# Patient Record
Sex: Female | Born: 1940 | Race: White | Hispanic: No | Marital: Married | Smoking: Former smoker
Health system: Southern US, Community
[De-identification: ages and names within clinical notes are randomized; demographics above are authoritative.]

## PROBLEM LIST (undated history)

## (undated) DIAGNOSIS — K219 Gastro-esophageal reflux disease without esophagitis: Secondary | ICD-10-CM

## (undated) DIAGNOSIS — E785 Hyperlipidemia, unspecified: Secondary | ICD-10-CM

## (undated) DIAGNOSIS — E118 Type 2 diabetes mellitus with unspecified complications: Secondary | ICD-10-CM

## (undated) DIAGNOSIS — F329 Major depressive disorder, single episode, unspecified: Secondary | ICD-10-CM

## (undated) DIAGNOSIS — D649 Anemia, unspecified: Secondary | ICD-10-CM

## (undated) DIAGNOSIS — I6529 Occlusion and stenosis of unspecified carotid artery: Secondary | ICD-10-CM

## (undated) DIAGNOSIS — K5792 Diverticulitis of intestine, part unspecified, without perforation or abscess without bleeding: Secondary | ICD-10-CM

## (undated) DIAGNOSIS — F32A Depression, unspecified: Secondary | ICD-10-CM

## (undated) DIAGNOSIS — I2510 Atherosclerotic heart disease of native coronary artery without angina pectoris: Secondary | ICD-10-CM

## (undated) DIAGNOSIS — N183 Chronic kidney disease, stage 3 unspecified: Secondary | ICD-10-CM

## (undated) DIAGNOSIS — M1990 Unspecified osteoarthritis, unspecified site: Secondary | ICD-10-CM

## (undated) DIAGNOSIS — S8261XA Displaced fracture of lateral malleolus of right fibula, initial encounter for closed fracture: Secondary | ICD-10-CM

## (undated) DIAGNOSIS — R32 Unspecified urinary incontinence: Secondary | ICD-10-CM

## (undated) DIAGNOSIS — I10 Essential (primary) hypertension: Secondary | ICD-10-CM

## (undated) DIAGNOSIS — I5189 Other ill-defined heart diseases: Secondary | ICD-10-CM

## (undated) DIAGNOSIS — I639 Cerebral infarction, unspecified: Secondary | ICD-10-CM

## (undated) HISTORY — DX: Other ill-defined heart diseases: I51.89

## (undated) HISTORY — DX: Occlusion and stenosis of unspecified carotid artery: I65.29

## (undated) HISTORY — DX: Diverticulitis of intestine, part unspecified, without perforation or abscess without bleeding: K57.92

## (undated) HISTORY — DX: Atherosclerotic heart disease of native coronary artery without angina pectoris: I25.10

## (undated) HISTORY — DX: Unspecified osteoarthritis, unspecified site: M19.90

## (undated) HISTORY — PX: TONSILLECTOMY: SUR1361

## (undated) HISTORY — DX: Type 2 diabetes mellitus with unspecified complications: E11.8

## (undated) HISTORY — DX: Chronic kidney disease, stage 3 unspecified: N18.30

## (undated) HISTORY — DX: Hyperlipidemia, unspecified: E78.5

## (undated) HISTORY — DX: Chronic kidney disease, stage 3 (moderate): N18.3

## (undated) HISTORY — DX: Unspecified urinary incontinence: R32

## (undated) HISTORY — DX: Anemia, unspecified: D64.9

## (undated) HISTORY — DX: Cerebral infarction, unspecified: I63.9

---
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
## 1971-01-02 HISTORY — PX: ABDOMINAL HYSTERECTOMY: SHX81

## 1988-01-02 HISTORY — PX: CHOLECYSTECTOMY: SHX55

## 2009-02-21 ENCOUNTER — Ambulatory Visit: Payer: Self-pay | Admitting: Family Medicine

## 2010-01-25 ENCOUNTER — Ambulatory Visit: Payer: Self-pay

## 2011-04-23 ENCOUNTER — Ambulatory Visit: Payer: Self-pay | Admitting: Family Medicine

## 2011-05-03 LAB — HM COLONOSCOPY

## 2011-05-07 ENCOUNTER — Ambulatory Visit: Payer: Self-pay | Admitting: Gastroenterology

## 2011-05-08 LAB — PATHOLOGY REPORT

## 2012-06-05 ENCOUNTER — Ambulatory Visit: Payer: Self-pay | Admitting: Family Medicine

## 2013-09-23 ENCOUNTER — Ambulatory Visit: Payer: Self-pay | Admitting: Family Medicine

## 2013-09-25 ENCOUNTER — Ambulatory Visit: Payer: Self-pay | Admitting: Family Medicine

## 2013-09-29 ENCOUNTER — Inpatient Hospital Stay: Payer: Self-pay | Admitting: Internal Medicine

## 2013-09-29 LAB — TROPONIN I: TROPONIN-I: 0.03 ng/mL

## 2013-09-29 LAB — URINALYSIS, COMPLETE
Bilirubin,UR: NEGATIVE
Glucose,UR: NEGATIVE mg/dL
Leukocyte Esterase: NEGATIVE
Nitrite: NEGATIVE
Ph: 5
Protein: 100
RBC,UR: 1 /HPF
Specific Gravity: 1.015
Squamous Epithelial: 1
WBC UR: 1 /HPF

## 2013-09-29 LAB — COMPREHENSIVE METABOLIC PANEL WITH GFR
Albumin: 3.7 g/dL
Alkaline Phosphatase: 79 U/L
Anion Gap: 7
BUN: 25 mg/dL — ABNORMAL HIGH
Bilirubin,Total: 0.4 mg/dL
Calcium, Total: 9 mg/dL
Chloride: 111 mmol/L — ABNORMAL HIGH
Co2: 24 mmol/L
Creatinine: 1.47 mg/dL — ABNORMAL HIGH
EGFR (African American): 45 — ABNORMAL LOW
EGFR (Non-African Amer.): 37 — ABNORMAL LOW
Glucose: 120 mg/dL — ABNORMAL HIGH
Osmolality: 289
Potassium: 4.3 mmol/L
SGOT(AST): 20 U/L
SGPT (ALT): 16 U/L
Sodium: 142 mmol/L
Total Protein: 6.8 g/dL

## 2013-09-29 LAB — CBC
HCT: 46.2 %
HGB: 14.7 g/dL
MCH: 29.9 pg
MCHC: 31.7 g/dL — ABNORMAL LOW
MCV: 94 fL
Platelet: 187 x10 3/mm 3
RBC: 4.91 X10 6/mm 3
RDW: 13.2 %
WBC: 10.2 x10 3/mm 3

## 2013-09-30 ENCOUNTER — Ambulatory Visit: Payer: Self-pay | Admitting: Neurology

## 2013-09-30 ENCOUNTER — Ambulatory Visit: Payer: Self-pay | Admitting: General Surgery

## 2013-09-30 DIAGNOSIS — I359 Nonrheumatic aortic valve disorder, unspecified: Secondary | ICD-10-CM

## 2013-09-30 LAB — BASIC METABOLIC PANEL
Anion Gap: 7 (ref 7–16)
BUN: 22 mg/dL — ABNORMAL HIGH (ref 7–18)
CALCIUM: 8.7 mg/dL (ref 8.5–10.1)
Chloride: 110 mmol/L — ABNORMAL HIGH (ref 98–107)
Co2: 25 mmol/L (ref 21–32)
Creatinine: 1.29 mg/dL (ref 0.60–1.30)
EGFR (African American): 52 — ABNORMAL LOW
EGFR (Non-African Amer.): 43 — ABNORMAL LOW
GLUCOSE: 108 mg/dL — AB (ref 65–99)
OSMOLALITY: 287 (ref 275–301)
Potassium: 3.8 mmol/L (ref 3.5–5.1)
Sodium: 142 mmol/L (ref 136–145)

## 2013-09-30 LAB — LIPID PANEL
CHOLESTEROL: 203 mg/dL — AB (ref 0–200)
HDL: 32 mg/dL — AB (ref 40–60)
LDL CHOLESTEROL, CALC: 133 mg/dL — AB (ref 0–100)
Triglycerides: 189 mg/dL (ref 0–200)
VLDL CHOLESTEROL, CALC: 38 mg/dL (ref 5–40)

## 2013-10-02 ENCOUNTER — Encounter: Payer: Self-pay | Admitting: *Deleted

## 2013-10-02 ENCOUNTER — Inpatient Hospital Stay (HOSPITAL_COMMUNITY)
Admission: RE | Admit: 2013-10-02 | Discharge: 2013-10-21 | DRG: 057 | Disposition: A | Discharge status: Home Health | Payer: Medicare Other | Source: Other Acute Inpatient Hospital | Attending: Physical Medicine & Rehabilitation | Admitting: Physical Medicine & Rehabilitation

## 2013-10-02 ENCOUNTER — Encounter (HOSPITAL_COMMUNITY): Payer: Self-pay | Admitting: Physical Medicine and Rehabilitation

## 2013-10-02 DIAGNOSIS — F17210 Nicotine dependence, cigarettes, uncomplicated: Secondary | ICD-10-CM | POA: Diagnosis present

## 2013-10-02 DIAGNOSIS — E785 Hyperlipidemia, unspecified: Secondary | ICD-10-CM | POA: Diagnosis present

## 2013-10-02 DIAGNOSIS — I6330 Cerebral infarction due to thrombosis of unspecified cerebral artery: Secondary | ICD-10-CM

## 2013-10-02 DIAGNOSIS — K5900 Constipation, unspecified: Secondary | ICD-10-CM | POA: Diagnosis not present

## 2013-10-02 DIAGNOSIS — K219 Gastro-esophageal reflux disease without esophagitis: Secondary | ICD-10-CM | POA: Diagnosis present

## 2013-10-02 DIAGNOSIS — I10 Essential (primary) hypertension: Secondary | ICD-10-CM | POA: Diagnosis present

## 2013-10-02 DIAGNOSIS — I69351 Hemiplegia and hemiparesis following cerebral infarction affecting right dominant side: Principal | ICD-10-CM

## 2013-10-02 DIAGNOSIS — F3341 Major depressive disorder, recurrent, in partial remission: Secondary | ICD-10-CM | POA: Diagnosis present

## 2013-10-02 DIAGNOSIS — M109 Gout, unspecified: Secondary | ICD-10-CM | POA: Diagnosis not present

## 2013-10-02 DIAGNOSIS — R4587 Impulsiveness: Secondary | ICD-10-CM | POA: Diagnosis present

## 2013-10-02 DIAGNOSIS — G8110 Spastic hemiplegia affecting unspecified side: Secondary | ICD-10-CM | POA: Diagnosis present

## 2013-10-02 DIAGNOSIS — F329 Major depressive disorder, single episode, unspecified: Secondary | ICD-10-CM | POA: Diagnosis present

## 2013-10-02 DIAGNOSIS — E119 Type 2 diabetes mellitus without complications: Secondary | ICD-10-CM | POA: Diagnosis present

## 2013-10-02 DIAGNOSIS — G8191 Hemiplegia, unspecified affecting right dominant side: Secondary | ICD-10-CM

## 2013-10-02 DIAGNOSIS — R062 Wheezing: Secondary | ICD-10-CM | POA: Diagnosis not present

## 2013-10-02 DIAGNOSIS — I639 Cerebral infarction, unspecified: Secondary | ICD-10-CM | POA: Diagnosis present

## 2013-10-02 DIAGNOSIS — N289 Disorder of kidney and ureter, unspecified: Secondary | ICD-10-CM

## 2013-10-02 HISTORY — DX: Essential (primary) hypertension: I10

## 2013-10-02 HISTORY — DX: Major depressive disorder, single episode, unspecified: F32.9

## 2013-10-02 HISTORY — DX: Depression, unspecified: F32.A

## 2013-10-02 MED ORDER — NICOTINE 14 MG/24HR TD PT24
14.0000 mg | MEDICATED_PATCH | Freq: Every day | TRANSDERMAL | Status: DC
  Administered 2013-10-03 – 2013-10-21 (×19): 14 mg via TRANSDERMAL
  Filled 2013-10-02 (×22): qty 1

## 2013-10-02 MED ORDER — FLUTICASONE PROPIONATE 50 MCG/ACT NA SUSP
1.0000 | Freq: Every day | NASAL | Status: DC
  Administered 2013-10-02 – 2013-10-21 (×19): 1 via NASAL
  Filled 2013-10-02: qty 16

## 2013-10-02 MED ORDER — ALBUTEROL SULFATE HFA 108 (90 BASE) MCG/ACT IN AERS: 1.0000 | INHALATION_SPRAY | RESPIRATORY_TRACT | Status: DC | PRN

## 2013-10-02 MED ORDER — SIMVASTATIN 20 MG PO TABS
20.0000 mg | ORAL_TABLET | Freq: Every day | ORAL | Status: DC
  Administered 2013-10-02 – 2013-10-20 (×19): 20 mg via ORAL
  Filled 2013-10-02 (×20): qty 1

## 2013-10-02 MED ORDER — TRAMADOL HCL 50 MG PO TABS
50.0000 mg | ORAL_TABLET | Freq: Four times a day (QID) | ORAL | Status: DC | PRN
  Administered 2013-10-08 – 2013-10-10 (×3): 50 mg via ORAL
  Filled 2013-10-02 (×5): qty 1

## 2013-10-02 MED ORDER — PROCHLORPERAZINE MALEATE 5 MG PO TABS
5.0000 mg | ORAL_TABLET | Freq: Four times a day (QID) | ORAL | Status: DC | PRN
  Filled 2013-10-02: qty 2

## 2013-10-02 MED ORDER — LISINOPRIL 5 MG PO TABS
5.0000 mg | ORAL_TABLET | Freq: Two times a day (BID) | ORAL | Status: DC
  Administered 2013-10-02 – 2013-10-10 (×16): 5 mg via ORAL
  Filled 2013-10-02 (×18): qty 1

## 2013-10-02 MED ORDER — DIPHENHYDRAMINE HCL 12.5 MG/5ML PO ELIX: 12.5000 mg | ORAL_SOLUTION | Freq: Four times a day (QID) | ORAL | Status: DC | PRN

## 2013-10-02 MED ORDER — ENOXAPARIN SODIUM 40 MG/0.4ML ~~LOC~~ SOLN
40.0000 mg | SUBCUTANEOUS | Status: DC
  Administered 2013-10-02 – 2013-10-04 (×3): 40 mg via SUBCUTANEOUS
  Filled 2013-10-02 (×4): qty 0.4

## 2013-10-02 MED ORDER — ASPIRIN EC 325 MG PO TBEC
325.0000 mg | DELAYED_RELEASE_TABLET | Freq: Every day | ORAL | Status: DC
  Administered 2013-10-03 – 2013-10-21 (×19): 325 mg via ORAL
  Filled 2013-10-02 (×20): qty 1

## 2013-10-02 MED ORDER — GUAIFENESIN-DM 100-10 MG/5ML PO SYRP
5.0000 mL | ORAL_SOLUTION | Freq: Four times a day (QID) | ORAL | Status: DC | PRN
  Administered 2013-10-11 – 2013-10-20 (×2): 10 mL via ORAL
  Filled 2013-10-02 (×2): qty 10

## 2013-10-02 MED ORDER — PROCHLORPERAZINE 25 MG RE SUPP
12.5000 mg | Freq: Four times a day (QID) | RECTAL | Status: DC | PRN
  Filled 2013-10-02: qty 1

## 2013-10-02 MED ORDER — PROCHLORPERAZINE EDISYLATE 5 MG/ML IJ SOLN
5.0000 mg | Freq: Four times a day (QID) | INTRAMUSCULAR | Status: DC | PRN
  Filled 2013-10-02: qty 2

## 2013-10-02 MED ORDER — ASPIRIN EC 81 MG PO TBEC: 81.0000 mg | DELAYED_RELEASE_TABLET | Freq: Every day | ORAL | Status: DC

## 2013-10-02 MED ORDER — TROLAMINE SALICYLATE 10 % EX CREA
TOPICAL_CREAM | Freq: Two times a day (BID) | CUTANEOUS | Status: DC
  Filled 2013-10-02: qty 85

## 2013-10-02 MED ORDER — ESCITALOPRAM OXALATE 10 MG PO TABS
10.0000 mg | ORAL_TABLET | Freq: Every day | ORAL | Status: DC
  Administered 2013-10-02 – 2013-10-20 (×19): 10 mg via ORAL
  Filled 2013-10-02 (×20): qty 1

## 2013-10-02 MED ORDER — ALBUTEROL SULFATE (2.5 MG/3ML) 0.083% IN NEBU: 2.5000 mg | INHALATION_SOLUTION | RESPIRATORY_TRACT | Status: DC | PRN

## 2013-10-02 MED ORDER — MUSCLE RUB 10-15 % EX CREA
TOPICAL_CREAM | Freq: Two times a day (BID) | CUTANEOUS | Status: DC
  Administered 2013-10-02 – 2013-10-18 (×11): via TOPICAL
  Filled 2013-10-02: qty 85

## 2013-10-02 MED ORDER — ALUM & MAG HYDROXIDE-SIMETH 200-200-20 MG/5ML PO SUSP
30.0000 mL | ORAL | Status: DC | PRN
  Administered 2013-10-06: 30 mL via ORAL
  Filled 2013-10-02 (×2): qty 30

## 2013-10-02 MED ORDER — TRAZODONE HCL 50 MG PO TABS: 25.0000 mg | ORAL_TABLET | Freq: Every evening | ORAL | Status: DC | PRN

## 2013-10-02 MED ORDER — ACETAMINOPHEN 325 MG PO TABS: 325.0000 mg | ORAL_TABLET | ORAL | Status: DC | PRN

## 2013-10-02 NOTE — Progress Notes (Signed)
Patient arrived via [facility]. Patient oriented to room, rehab process, fall prevention plan, rehab safety plan and call light system with verbal understanding. Patient resting comfortably in bed with call bell at side. No c/o pain, will continue to monitor.

## 2013-10-02 NOTE — PMR Pre-admission (Signed)
[facility] PMR Admission Coordinator Pre-Admission Assessment  Patient: Candace Monroe is an 73 y.o., female MRN: [id number] DOB: 1940-12-15 Height: 5' (152.4 cm) Weight: 57.607 kg (127 lb)  Insurance Information HMO: yes     PPO:      PCP:      IPA:      80/20:      OTHER:  PRIMARY: Medicare UHC      Policy#: [id number]      Subscriber: self CM Name: Willa Frater      Phone#: [phone]     Fax#: [phone] Approval given from [place] with [facility] Medicare on 10-2 for seven days, 10-02-13 through 10-09-13 with updates due to Dorthula Nettles at above fax on 11-06-70 Pre-Cert#: [id number]      Employer: retired Benefits:  Phone #: [phone]     Name: Rolla Flatten. Date: 08-01-13      Deduct: none      Out of Pocket Max: [phone] (met $80.03)      Life Max: unlimited CIR: $430/day for days 1-4, pre-auth needed      SNF: $0/day for days 1-20; $155/day for days 21-64; $0/day for days 65-100 (max 100 days, pre-auth needed) Outpatient: 100%     Co-Pay: $40/visist, visit limit per medical necessity Home Health: 100%      Co-Pay: auth required at day 60 DME: 80%     Co-Pay: 20%  Providers: in network only  Emergency Facilities manager Information   Name Relation Home Work Sanders T Spouse [phone]  [phone]   Nicky, [place]   [phone]      Current Medical History  Patient Admitting Diagnosis: Left frontal CVA History of Present Illness:  This 73 year old female with a history of HTN and tobacco dependence present to [facility] emergency room with right sided weakness on 09-29-13. She was sitting on her porch that day when she developed a sudden onset of right sided pain and inability to move her right leg. Pt had to crawl inside to call her daughter and then EMS was called. CT head was negative and MRI findings revealed acute lacunar infarct in left frontal lobe. Further stroke work up completed and inpatient rehab was recommended by  therapy.  Patient's medical record from [facility] has been reviewed by the rehabilitation admission coordinator and physician.  Past Medical History  No past medical history on file.  Family History  family history is not on file.  Prior Rehab/Hospitalizations: none   Current Medications See MAR  Patients Current Diet:  Heart healthy  Precautions / Restrictions Precautions Precautions: Fall   Prior Activity Level Limited Community (1-2x/wk): Pt got out 2-3 x/week but does not drive. She would mainly stay in the car and husband did the errands. Pt enjoys her family and sitting on her porch.  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)   Upper Body Dressing  Independent  Min assist   Lower Body Dressing  Independent  Max assist   Grooming  Independent  Min assist   Eating/Drinking  Independent  Min assist   Toilet Transfer  Independent  Other (not assessed, anticipate max assist  x 2)   Bladder Continence   WFL  using bedpan   Bowel Management  WFL  no BM documented in [facility] system   Stair Climbing   Independent  Other (not assessed, anticipate needs)   Communication  [facility]  WFL, a bit "fiesty"    Memory  WFL  not assessed    Cooking/Meal Prep  Independent      Housework  Independent    Money Management  Independent    Driving   pt did not drive. Husband did the driving.     Special needs/care consideration BiPAP/CPAP no  CPM no  Continuous Drip IV no  Dialysis no          Life Vest no  Oxygen no  Special Bed no  Trach Size no  Wound Vac (area) no      Skin - no issues                                Bowel mgmt: no BM documented in [facility] system Bladder mgmt: using bedpan  Diabetic mgmt - pt was diabetic previously and per husband/son, "She was taken off her DM meds about a year ago and is doing fine."  Previous Home Environment Living Arrangements: Spouse/significant other  Lives With: Spouse Available Help at Discharge: Family Type of Home: Mobile home Home Layout: One level Home Access: Stairs to enter Entrance Stairs-Rails: Right;Left (rails can be adjusted or a ramp can be built per son) Technical brewer of Steps: 4  Discharge Living Setting Plans for Discharge Living Setting: Patient's home;Mobile Home Type of Home at Discharge: Mobile home Discharge Home Layout: One level Discharge Home Access: Stairs to enter Entrance Stairs-Rails: Right;Left (rails can be adjusted or ramp can be built per son) Technical brewer of Steps: 4 Does the patient have any problems obtaining your medications?: No  Social/Family/Support Systems Patient Roles: Spouse Contact Information: husband Joneen Caraway is primary contact Anticipated Caregiver: husband (son and dtr can also help as needed) (son does work but dtr can help as needed) Anticipated Ambulance person Information: see above Ability/Limitations of Caregiver: no limitations Caregiver Availability: 24/7 Discharge Plan Discussed with Primary Caregiver: Yes (discussed with pt and son on 10-01-13) Is Caregiver In Agreement with Plan?: Yes Does Caregiver/Family have Issues with Lodging/Transportation while Pt is in Rehab?: No (they may want to spend the night with pt)  Goals/Additional Needs Patient/Family Goal for Rehab: Supervision and Min assist wtih PT/OT; Supervision and Mod Ind with SLP Expected length of stay: 10-14 days Cultural Considerations: none Dietary Needs: heart healthy Equipment Needs: to be determined Pt/Family Agrees to Admission and willing to participate: Yes Program Orientation Provided & Reviewed with  Pt/Caregiver Including Roles  & Responsibilities: Yes  Patient Condition: I met with pt and her husband/son at [facility]) to explain the possibility of inpatient rehab. This 73 year old female was previously independent prior to this new L frontal CVA. She is currently requiring maximal assistance for bed mobility and maximal assistance of 2 for limited stand pivot transfers. She is displaying R LE weakness and is unable to take steps at this time. Her self care skills have also been impacted by the CVA. She is needing maximal assistance with bathing and dressing tasks and has decreased insight into her functional limitations. Patient will benefit from further skilled speech therapy to address higher level cognitive needs. She will benefit greatly from the multi-disciplinary  team approach of skilled PT, OT, SLP and rehab nursing to maximize her function following this new CVA. PT, OT and rehab nursing will focus on increasing strength for greater independence with bed mobility, transfers, gait and self care tasks. Rehab nursing will also address pt/family education needs with medications and bowel/bladder care.  In addition, pt will benefit from rehab physician intervention to monitor medical issues following her CVA. Discussed case with Dr. Naaman Plummer who feels that pt is a good inpatient rehab candidate and received clearance from Dr. Benjie Karvonen at [facility]. Pt and her family are motivated to come to inpatient rehab and will benefit from the intensive services of skilled therapy under rehab physician guidance. Pt will be admitted today on 10-02-13.  Preadmission Screen Completed By:  Nanetta Batty, PT, 10/02/2013 9:48 AM ______________________________________________________________________   Discussed status with Dr. Naaman Plummer on 10-02-13 at 1346 and received telephone approval for admission today.  Admission Coordinator:  Nanetta Batty, PT, time 1346/Date 10-02-13    Assessment/Plan: Diagnosis: left frontal cva 1. Does the need for close, 24 hr/day  Medical supervision in concert with the patient's rehab needs make it unreasonable for this patient to be served in a less intensive setting? Yes 2. Co-Morbidities requiring supervision/potential complications: htn, post-cva sequelae 3. Due to bladder management, bowel management, safety, skin/wound care, disease management, medication administration, pain management and patient education, does the patient require 24 hr/day rehab nursing? Yes 4. Does the patient require coordinated care of a physician, rehab nurse, PT (1-2 hrs/day, 5 days/week), OT (1-2 hrs/day, 5 days/week) and SLP (1-2 hrs/day, 5 days/week) to address physical and functional deficits in the context of the above medical diagnosis(es)? Yes Addressing deficits in the following areas: balance, endurance, locomotion, strength, transferring, bowel/bladder control, bathing, dressing, feeding, grooming, toileting, cognition, speech, language, swallowing and psychosocial support 5. Can the patient actively participate in an intensive therapy program of at least 3 hrs of therapy 5 days a week? Yes 6. The potential for patient to make measurable gains while on inpatient rehab is excellent 7. Anticipated functional outcomes upon discharge from inpatients are: supervision and min assist PT, supervision and min assist OT, supervision SLP 8. Estimated rehab length of stay to reach the above functional goals is: 18-24 days 9. Does the patient have adequate social supports to accommodate these discharge functional goals? Yes 10. Anticipated D/C setting: Home 11. Anticipated post D/C treatments: HH therapy and Outpatient therapy 12. Overall Rehab/Functional Prognosis: excellent    RECOMMENDATIONS: This patient's condition is appropriate for continued rehabilitative care in the following setting: CIR Patient has agreed to participate in recommended program.  Yes Note that insurance prior authorization may be required for reimbursement for recommended care.  Comment: Admit to inpatient rehab today.   Armistead Sult, PT 10/02/2013

## 2013-10-03 ENCOUNTER — Inpatient Hospital Stay (HOSPITAL_COMMUNITY): Payer: Medicare Other | Admitting: Occupational Therapy

## 2013-10-03 ENCOUNTER — Inpatient Hospital Stay (HOSPITAL_COMMUNITY): Payer: Medicare Other | Admitting: Speech Pathology

## 2013-10-03 ENCOUNTER — Inpatient Hospital Stay (HOSPITAL_COMMUNITY): Payer: Medicare Other | Admitting: Physical Therapy

## 2013-10-03 DIAGNOSIS — I6330 Cerebral infarction due to thrombosis of unspecified cerebral artery: Secondary | ICD-10-CM

## 2013-10-03 DIAGNOSIS — G8190 Hemiplegia, unspecified affecting unspecified side: Secondary | ICD-10-CM

## 2013-10-03 DIAGNOSIS — I10 Essential (primary) hypertension: Secondary | ICD-10-CM

## 2013-10-03 MED ORDER — SORBITOL 70 % SOLN
60.0000 mL | Status: AC
  Administered 2013-10-03: 60 mL via ORAL
  Filled 2013-10-03: qty 60

## 2013-10-03 MED ORDER — BISACODYL 10 MG RE SUPP
10.0000 mg | Freq: Every day | RECTAL | Status: DC | PRN
  Administered 2013-10-09: 10 mg via RECTAL
  Filled 2013-10-03: qty 1

## 2013-10-03 NOTE — Progress Notes (Signed)
Occupational Therapy Assessment and Plan  Patient Details  Name: Candace Monroe MRN: [id number] Date of Birth: 1940/01/12  OT Diagnosis: abnormal posture, hemiplegia affecting dominant side and muscle weakness (generalized) Rehab Potential: Rehab Potential: Good (for stated goals) ELOS: 17-19 days   Today's Date: 10/03/2013 OT Individual Time: 1100-1200 OT Individual Time Calculation (min): 60 min     Problem List:  Patient Active Problem List   Diagnosis Date Noted  . CVA (cerebral infarction) 10/02/2013    Past Medical History:  Past Medical History  Diagnosis Date  . HTN (hypertension)   . Depression   . Breast mass    Past Surgical History:  Past Surgical History  Procedure Laterality Date  . Tonsillectomy    . Abdominal hysterectomy    . Cholecystectomy      Assessment & Plan Clinical Impression: Patient is a 73 y.o. year old female with history of HTN, diabetes (off medications), right breast mass, ongoing tobacco use who was admitted to [place] on 09/27/13 with right sided weakness. Blood pressure 240/100 at admission. CT head without acute changes. MRI brain done revealing acute lacunar infarct left frontal lobe peri-Rolandic white matter, moderate to severe underlying chronic small vessel disease. 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 Cardiac echo with EF 50-60% with no wall abnormality, trace MVR. She was evaluated by Dr. Irish Elders who recommended full strength ASA for thrombotic stroke due to uncontrolled HTN, tobacco use and diabetes. 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 Patient transferred to CIR on 10/02/2013 .    Patient currently requires max-total A with basic self-care skills secondary to muscle weakness, decreased cardiorespiratoy endurance, decreased attention, decreased awareness,  decreased problem solving, decreased safety awareness and decreased memory and decreased sitting balance, decreased standing balance, decreased postural control, hemiplegia and decreased balance strategies.  Prior to hospitalization, patient could complete ADLs with independent .  Patient will benefit from skilled intervention to decrease level of assist with basic self-care skills prior to discharge home with care partner.  Anticipate patient will require 24 hour supervision and minimal physical assistance and follow up home health.  OT - End of Session Activity Tolerance: Decreased this session Endurance Deficit: Yes OT Assessment Rehab Potential: Good (for stated goals) Barriers to Discharge:  (none at this time) OT Patient demonstrates impairments in the following area(s): Balance;Cognition;Endurance;Motor;Safety OT Basic ADL's Functional Problem(s): Grooming;Bathing;Dressing;Toileting OT Advanced ADL's Functional Problem(s): Laundry OT Transfers Functional Problem(s): Toilet;Tub/Shower OT Plan OT Intensity: Minimum of 1-2 x/day, 45 to 90 minutes OT Frequency: 5 out of 7 days OT Duration/Estimated Length of Stay: 17-19 days OT Treatment/Interventions: Balance/vestibular training;Neuromuscular re-education;Patient/family education;Self Care/advanced ADL retraining;Therapeutic Exercise;UE/LE Coordination activities;Visual/perceptual remediation/compensation;UE/LE Strength taining/ROM;Therapeutic Activities;Psychosocial support;Functional mobility training;DME/adaptive equipment instruction;Discharge planning;Cognitive remediation/compensation OT Self Feeding Anticipated Outcome(s): n/a OT Basic Self-Care Anticipated Outcome(s): min A OT Toileting Anticipated Outcome(s): min A OT Bathroom Transfers Anticipated Outcome(s): min A OT Recommendation Recommendations for Other Services: Other (comment) (RT) Patient destination: Home Follow Up Recommendations: Home health OT Equipment  Recommended: 3 in 1 bedside comode;Tub/shower bench Equipment Details: has personal walker   Skilled Therapeutic Intervention Upon entering the room, pt supine in bed with husband, son , and daughter in law present during session. Pt and family educated on OT purpose, POC, and goals with pt and family in full agreement. Pt reported taking bathing and dressing with NT prior to OT arrival. OT session  with focus on STS, functional transfers, pt and family education, ADL training, and safety awareness. EOB balance with Min - Max A as pt with strong R lateral lean as well as posterior lean with activities.  Pt with decreased FMC in R hand as she was unable to zip jacket or write name legibly. Pt wearing gown and unable to problem solve how to remove without verbal cues from therapist. Pt requires max A stand pivot transfers to wheelchair from bed as well as from w/c to toilet. Pt reports her only problem is , "my foot."  OT educated and discussed deficits with pt for her to understanding progression towards goals in inpatient rehab. Education to continue. Pt seated in wheelchair with call bell and quick release belt donned. Family still present in room upon therapist exiting.   OT Evaluation Precautions/Restrictions  Precautions Precautions: Fall Precaution Comments: Pushes to R side. Restrictions Weight Bearing Restrictions: No General Chart Reviewed: Yes Vital Signs Therapy Vitals Temp: 98.3 F (36.8 C) Temp Source: Oral Pulse Rate: 79 Resp: 18 BP: 143/78 mmHg Patient Position (if appropriate): Lying Oxygen Therapy SpO2: 93 % O2 Device: None (Room air) Pain Pain Assessment Pain Assessment: No/denies pain Pain Score: 0-No pain Home Living/Prior Functioning Home Living Available Help at Discharge: Family;Available 24 hours/day;Available PRN/intermittently Type of Home: Mobile home Home Access: Stairs to enter Entrance Stairs-Number of Steps: 4 Entrance Stairs-Rails: Right;Left Home  Layout: One level Additional Comments: Pt owns personal standard walker.  Lives With: Spouse IADL History Homemaking Responsibilities: Yes (Pt reports husband performs all  of these tasks except laundry) Prior Function Level of Independence: Independent with basic ADLs;Independent with homemaking with ambulation;Independent with gait;Other (comment) (Husband does most of cooking and helps pt with cleaning)  Able to Take Stairs?: Yes Driving: Yes Vocation: Retired Leisure: Hobbies-yes (Comment) Comments: Pt enjoys word search puzzles, being outdoors. Vision/Perception  Vision- History Baseline Vision/History: Wears glasses Wears Glasses: Reading only Patient Visual Report: No change from baseline  Cognition Overall Cognitive Status: Impaired/Different from baseline Arousal/Alertness: Awake/alert Orientation Level: Oriented X4 Attention: Selective;Sustained Sustained Attention: Appears intact Sustained Attention Impairment: Verbal basic;Functional basic Selective Attention: Impaired Selective Attention Impairment: Verbal basic;Functional basic Memory: Impaired Memory Impairment: Decreased recall of new information Awareness: Impaired Awareness Impairment: Intellectual impairment Problem Solving: Impaired Problem Solving Impairment: Functional complex Executive Function: Self Monitoring;Decision Making;Self Correcting Decision Making: Impaired Decision Making Impairment: Functional basic Self Monitoring: Impaired Self Monitoring Impairment: Functional complex Self Correcting: Impaired Self Correcting Impairment: Functional complex Behaviors: Poor frustration tolerance Safety/Judgment: Impaired Sensation Sensation Light Touch: Appears Intact Stereognosis: Not tested Hot/Cold: Appears Intact Proprioception: Appears Intact Coordination Gross Motor Movements are Fluid and Coordinated: No Fine Motor Movements are Fluid and Coordinated: No Coordination and Movement  Description: difficulty writing name and zipping jacket Heel Shin Test: LLE WNL. Able to get into testing position with RLE, but no excursion with  RLE. Motor  Motor Motor: Hemiplegia;Abnormal tone;Abnormal postural alignment and control Mobility  Bed Mobility Bed Mobility: Supine to Sit Supine to Sit: 2: Max assist Supine to Sit Details: Tactile cues for sequencing;Verbal cues for technique;Verbal cues for sequencing Transfers Transfers: Sit to Stand Sit to Stand: 2: Max assist Stand to Sit Details (indicate cue type and reason): Verbal cues for precautions/safety;Manual facilitation for weight shifting;Verbal cues for sequencing  Trunk/Postural Assessment  Cervical Assessment Cervical Assessment: Exceptions to WFL (forward head) Thoracic Assessment Thoracic Assessment: Exceptions to WFL (kyphotic) Lumbar Assessment Lumbar Assessment: Exceptions to WFL (posterior pelvic tilt) Postural Control Postural Control: Deficits   on evaluation Trunk Control: In seated, pt with posterior preference, tendency toward lateral trunk lean to R side. In standing with bilay UE support at sink, pt pushed trunk to R side. Postural Limitations: Pt presents with R lateral lean as well as posterior lean in seated position.  Balance Balance Balance Assessed: Yes (Max A for dynamic standing balance for LB clothing management.) Extremity/Trunk Assessment RUE Assessment RUE Assessment: Exceptions to [facility] (strength 3-/5 grossly. Grip strength decreased as well vs left hand. ) LUE Assessment LUE Assessment: Within Functional Limits  FIM:  FIM - Eating Eating Activity: 5: Set-up assist for open containers FIM - Grooming Grooming Steps: Wash, rinse, dry face;Wash, rinse, dry hands;Brush, comb hair;Oral care, brush teeth, clean dentures Grooming: 5: Set-up assist to obtain items FIM - Bathing Bathing: 0: Activity did not occur FIM - Upper Body Dressing/Undressing Upper body dressing/undressing: 0: Wears  gown/pajamas-no public clothing FIM - Lower Body Dressing/Undressing Lower body dressing/undressing: 1: Total-Patient completed less than 25% of tasks FIM - Toileting Toileting steps completed by patient: Performs perineal hygiene Toileting: 2: Max-Patient completed 1 of 3 steps FIM - Bed/Chair Transfer Bed/Chair Transfer: 1: Two helpers;2: Supine > Sit: Max A (lifting assist/Pt. 25-49%);2: Bed > Chair or W/C: Max A (lift and lower assist) FIM - Radio producer Devices: Grab bars Toilet Transfers: 2-To toilet/BSC: Max A (lift and lower assist);2-From toilet/BSC: Max A (lift and lower assist) FIM - Tub/Shower Transfers Tub/shower Transfers: 0-Activity did not occur or was simulated   Refer to Care Plan for Long Term Goals  Recommendations for other services: Other: RT and neuropsych  Discharge Criteria: Patient will be discharged from OT if patient refuses treatment 3 consecutive times without medical reason, if treatment goals not met, if there is a change in medical status, if patient makes no progress towards goals or if patient is discharged from hospital.  The above assessment, treatment plan, treatment alternatives and goals were discussed and mutually agreed upon: by patient and by family  Phineas Semen 10/03/2013, 5:55 PM

## 2013-10-03 NOTE — H&P (Signed)
Physical Medicine and Rehabilitation Admission H&P      CC: Right sided weakness   HPI: Ms. Candace Monroe is s 73 year old female with history of HTN, diabetes (off medications), right breast mass, ongoing tobacco use who was admitted to [facility] on 09/27/13 with right sided weakness. Blood pressure 240/100 at admission. CT head without acute changes. MRI brain done revealing acute lacunar infarct left frontal lobe peri-Rolandic white matter, moderate to severe underlying chronic small vessel disease. 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 Cardiac echo with EF 50-60% with no wall abnormality, trace MVR. She was evaluated by Dr. Irish Elders who recommended full strength ASA for thrombotic stroke due to uncontrolled HTN, tobacco use and diabetes. 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 Therapy initiated and CIR recommended by rehab team.      Review of Systems  HENT: Negative for hearing loss.   Eyes: Negative for blurred vision, double vision and photophobia.  Respiratory: Positive for cough (chronic) and wheezing (chronic).         Nasal congestion.   Cardiovascular: Negative for chest pain and palpitations.  Gastrointestinal: Positive for heartburn. Negative for nausea, vomiting and constipation.  Genitourinary: Positive for urgency (chronic).  Musculoskeletal: Positive for back pain (uses aleve) and myalgias.  Neurological: Positive for focal weakness. Negative for dizziness, tingling, tremors and headaches.  Psychiatric/Behavioral: Negative for depression. The patient does not have insomnia.      Past Medical History   Diagnosis  Date   .  HTN (hypertension)     .  Depression     .  Breast mass         Past Surgical History   Procedure  Laterality  Date   .  Tonsillectomy       .  Abdominal hysterectomy        .  Cholecystectomy           Family History   Problem  Relation  Age of Onset   .  Cancer  Sister     .  Diabetes  Brother     .  Stroke  Mother     .  Seizures  Sister        Social History: Married. Has 5 children. Independent without AD prior to admission. Used to work in textiles--retired about 30 years ago. Smokes 1 PPD. Does not use alcohol or illicit drugs.    Allergies:   Allergies   Allergen  Reactions   .  Augmentin [Amoxicillin-Pot Clavulanate]     .  Sulfa Antibiotics         No prescriptions prior to admission      Home: One level home with 4 STE    Functional History: Independent PTA.    Functional Status:   Mobility: Max assist with bed mobility Max assist +2 with transfers--unable to weight bear thorough RLE or un-weight LLE.      ADL: Min assist with upper body dressing. Max assist with lower body dressing.   Min assist with self feeds.    Cognition: Needs cues for sequencing.  Impulsive. Has poor awareness of deficits.     Physical Exam: Blood pressure 163/101, pulse 54, temperature 98.1 F (36.7 C), temperature source Oral, resp. rate 19, height 5' (1.524 m), weight 53.9 kg (118 lb 13.3 oz), SpO2 97.00%. Physical Exam  Nursing  note and vitals reviewed. Constitutional: She is oriented to person, place, and time. She appears well-developed and well-nourished.  Intermittent congested cough noted. Tends to joke a lot  HENT:  Oral mucosa pink and moist Head: Normocephalic and atraumatic.  Eyes: Conjunctivae are normal. Pupils are equal, round, and reactive to light.  Neck: Normal range of motion. Neck supple.  Cardiovascular: Normal rate and regular rhythm.   Respiratory: Effort normal. She has wheezes (audible upper airway). She has no rales. She exhibits no tenderness.  GI: Soft. Bowel sounds are normal. She exhibits no distension. There is no tenderness.  Musculoskeletal: She exhibits no edema and no tenderness.  Neurological:  She is alert and oriented to person, place, and time.  Speech clear. Able to follow two step commands without difficulty. Right hemiparesis--deltoid 3/5, biceps/triceps 4/5, intrinsics 4/5, HF 3+/4, KE 3-/5, PF 2+/5, DF trace.  Seems to sense pain/LT. Reasonable insight and awareness. Attends to right side. Language appears appropriate also Skin: Skin is warm and dry.  Psychiatric: She has a normal mood and affect. Her behavior is normal. Judgment and thought content normal.    Labs studies:  Chol: 203     HDL: 32      LDL:133     Trig: 189 Na: 142    K: 3.8     CL-110     CO2: 25    BUN:22    Cr: 1.29    Gluc: 108 WBC: 10.2     Hgb: 14.7    Hct: 46.2     Plt: 187     Medical Problem List and Plan: 1. 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  DVT Prophylaxis/Anticoagulation: Pharmaceutical: Lovenox 3. Pain Management: N/A 4. Depression/Mood: continue lexapro 5. Neuropsych: This patient is capable of making decisions on her own behalf. 6. Skin/Wound Care: Routine pressure relief measures 7. Fluids/Electrolytes/Nutrition: Monitor I/O. Offer supplements if intake poor. Follow up labs on admit   8. HTN: Blood pressures trending upward and medications resumed today. Permissive hypertension to allow perfusion. Will monitor every 8 hours and titrate medication to home dose.   9. Diabetes: No medications for past 1-2 years--reports she lost a lot of weight and last hgb A1c around 5. Will check hgb A1c. Fasting BS 108-120 range.   -dietary ed  10. Dyslipidemia: Continue lipitor.          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   Patient is admitted to receive collaborative, interdisciplinary care between the physiatrist, rehab nursing staff, and therapy team. Patient's level of medical complexity and substantial therapy needs in context of that  medical necessity cannot be provided at a lesser intensity of care such as a SNF. Patient has experienced substantial functional loss from his/her baseline which was documented above under the "Functional History" and "Functional Status" headings.  Judging by the patient's diagnosis, physical exam, and functional history, the patient has potential for functional progress which will result in measurable gains while on inpatient rehab.  These gains will be of substantial and practical use upon discharge  in facilitating mobility and self-care at the household level. Physiatrist will provide 24 hour management of medical needs as well as oversight of the therapy plan/treatment and provide guidance as appropriate regarding the interaction of the two. 24 hour rehab nursing will assist with bladder management, bowel management, safety, skin/wound care, disease management, medication administration and patient education  and help  integrate therapy concepts, techniques,education, etc. PT will assess and treat for/with: Lower extremity strength, range of motion, stamina, balance, functional mobility, safety, adaptive techniques and equipment, NMR, cognitive perceptual rx, family ed, ego support.   Goals are: supervision to min assist. OT will assess and treat for/with: ADL's, functional mobility, safety, upper extremity strength, adaptive techniques and equipment, NMR, cognitive perceptual awareness, family ed, community reintegration.   Goals are: supervision to min assist. Therapy may proceed with showering this patient. SLP will assess and treat for/with: cognition, communication.  Goals are: mod I to supervision. Case Management and Social Worker will assess and treat for psychological issues and discharge planning. Team conference will be held weekly to assess progress toward goals and to determine barriers to discharge. Patient will receive at least 3 hours of therapy per day at least 5 days per week. ELOS:  10-14 days        Prognosis:  excellent         Meredith Staggers, MD, [facility] 10/03/2013

## 2013-10-03 NOTE — Evaluation (Signed)
Speech Language Pathology Assessment and Plan  Patient Details  Name: Candace Monroe MRN: [id number] Date of Birth: 1940-08-10  SLP Diagnosis: Cognitive Impairments  Rehab Potential: Good ELOS: 17-19 days    Today's Date: 10/03/2013 SLP Individual Time: 8115-7262 SLP Individual Time Calculation (min): 60 min   Problem List:  Patient Active Problem List   Diagnosis Date Noted  . CVA (cerebral infarction) 10/02/2013   Past Medical History:  Past Medical History  Diagnosis Date  . HTN (hypertension)   . Depression   . Breast mass    Past Surgical History:  Past Surgical History  Procedure Laterality Date  . Tonsillectomy    . Abdominal hysterectomy    . Cholecystectomy      Assessment / Plan / Recommendation Clinical Impression Ms. Candace Monroe is a 73 year old female with history of HTN, diabetes (off medications), right breast mass, ongoing tobacco use who was admitted to [facility] on 09/27/13 with right sided weakness. Blood pressure 240/100 at admission. CT head without acute changes. MRI brain done revealing acute lacunar infarct left frontal lobe peri-Rolandic white matter, moderate to severe underlying chronic small vessel disease. 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 Therapy initiated and CIR recommended by rehab team; patient admitted 10/02/13.  Orders received and cognitive-linguistic evaluation completed.  Patient demonstrates moderate cognitive impairments characterized by impaired awareness of deficits, safety awareness, recall of new information and complex problem solving which impact the patient's overall safety with functional tasks. Patient would benefit from skilled SLP intervention to maximize her functional independence prior to discharge. Anticipate patient will require 24 hour supervision at home.   Skilled Therapeutic Interventions           Cognitive-linguistic evaluation completed with results and recommendations reviewed with patient and family.     SLP Assessment  Patient will need skilled [facility] during CIR admission    Recommendations  Oral Care Recommendations: Oral care BID Patient destination: Home Follow up Recommendations: 24 hour supervision/assistance;None Equipment Recommended: None recommended by SLP    SLP Frequency 5 out of 7 days   SLP Treatment/Interventions Cognitive remediation/compensation;Cueing hierarchy;Functional tasks;Medication managment;Internal/external aids;Patient/family education    Pain Pain Assessment Pain Assessment: No/denies pain Prior Functioning Cognitive/Linguistic Baseline: Within functional limits Type of Home: Mobile home Available Help at Discharge: Family;Available 24 hours/day;Available PRN/intermittently Vocation: Retired  Industrial/product designer Term Goals: Week 1: SLP Short Term Goal 1 (Week 1): Pt will label 2 physical and 1 cognitive change post CVA with Min question cues  SLP Short Term Goal 2 (Week 1): Pt will request help as needed with Mod question cues SLP Short Term Goal 3 (Week 1): Pt will demonstrate complex problem solving with Min assist to self-monitor and correct errors SLP Short Term Goal 4 (Week 1): Pt will utilize external aids to assist with recal of new information with Min cues  See FIM for current functional status Refer to Care Plan for Long Term Goals  Recommendations for other services: None  Discharge Criteria: Patient will be discharged from SLP if patient refuses treatment 3 consecutive times without medical reason, if treatment goals not met, if there is a change in medical status, if patient makes no progress towards goals or if patient is discharged from hospital.  The above assessment, treatment plan, treatment alternatives and goals were discussed and mutually agreed upon: by patient and by family  Gunnar Fusi, M.A.,  CCC-SLP [phone]  Akshay Spang 10/03/2013, 4:42 PM

## 2013-10-03 NOTE — Evaluation (Addendum)
Physical Therapy Assessment and Plan  Patient Details  Name: Candace Monroe MRN: [id number] Date of Birth: 1940/11/04  PT Diagnosis: Abnormal posture, Abnormality of gait, Hemiplegia dominant, Impaired cognition and Muscle weakness Rehab Potential: Good ELOS: 17-19 days   Today's Date: 10/03/2013 PT Individual Time: 0802-0904 PT Individual Time Calculation (min): 62 min    Problem List:  Patient Active Problem List   Diagnosis Date Noted  . CVA (cerebral infarction) 10/02/2013    Past Medical History:  Past Medical History  Diagnosis Date  . HTN (hypertension)   . Depression   . Breast mass    Past Surgical History:  Past Surgical History  Procedure Laterality Date  . Tonsillectomy    . Abdominal hysterectomy    . Cholecystectomy      Assessment & Plan Clinical Impression: Ms. Candace Monroe is s 73 year old female with history of HTN, diabetes (off medications), right breast mass, ongoing tobacco use who was admitted to [facility] on 09/27/13 with right sided weakness. Blood pressure 240/100 at admission. CT head without acute changes. MRI brain done revealing acute lacunar infarct left frontal lobe peri-Rolandic white matter, moderate to severe underlying chronic small vessel disease. 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 Cardiac echo with EF 50-60% with no wall abnormality, trace MVR. She was evaluated by Dr. Loretha Brasil who recommended full strength ASA for thrombotic stroke due to uncontrolled HTN, tobacco use and diabetes. 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 Patient transferred to CIR on 10/02/2013 .   Patient currently requires max-Total with mobility secondary to muscle weakness, decreased cardiorespiratoy endurance, impaired timing and sequencing, abnormal tone, unbalanced muscle activation and decreased  motor planning, decreased midline orientation and decreased attention to right, decreased initiation, decreased attention, decreased awareness, decreased problem solving and decreased memory and decreased sitting balance, decreased standing balance, decreased postural control, hemiplegia and decreased balance strategies.  Prior to hospitalization, patient was independent  with mobility and lived with Spouse in a Mobile home home.  Home access is 4  steps with 2 rails.  Patient will benefit from skilled PT intervention to maximize safe functional mobility and minimize fall risk for planned discharge home with 24 hour supervision.  Anticipate patient will benefit from follow up HH at discharge.  PT - End of Session Activity Tolerance: Tolerates 30+ min activity with multiple rests Endurance Deficit: Yes PT Assessment Rehab Potential: Good Barriers to Discharge: Other (comment) (None) PT Patient demonstrates impairments in the following area(s): Balance;Behavior;Endurance;Motor;Perception;Safety;Other (comment) (Cognition) PT Transfers Functional Problem(s): Bed Mobility;Bed to Chair;Car;Furniture PT Locomotion Functional Problem(s): Ambulation;Wheelchair Mobility;Stairs PT Plan PT Intensity: Minimum of 1-2 x/day ,45 to 90 minutes PT Frequency: 5 out of 7 days PT Duration Estimated Length of Stay: 17-19 days PT Treatment/Interventions: Ambulation/gait training;Balance/vestibular training;Cognitive remediation/compensation;Disease management/prevention;Discharge planning;Functional mobility training;Patient/family education;Splinting/orthotics;Therapeutic Exercise;Therapeutic Activities;Visual/perceptual remediation/compensation;UE/LE Coordination activities;UE/LE Strength taining/ROM;Wheelchair propulsion/positioning;Stair Forensic psychologist;Neuromuscular re-education PT Transfers Anticipated Outcome(s): Supervision PT Locomotion Anticipated Outcome(s): Min A to Mod A PT  Recommendation Recommendations for Other Services: Other (comment) (None at this time) Follow Up Recommendations: 24 hour supervision/assistance;Home health PT Patient destination: Home Equipment Recommended: To be determined  Skilled Therapeutic Intervention PT evaluation performed; see below for detailed findings. PT treatment initiated. Pt's clothing and bed soiled due to incontinent episode; pt aware but did not initiate notifying staff. Therefore, pt performed static standing x2 minutes with bilat UE support at  sink while PT provided assistance for stability whle NT assisted with hygiene and brief/clothing change. See below for further details of assist/cueing required with balance, transfers, gait, and w/c mobility. Stairs not assessed due to time constraint. Educated pt and daughter on evaluation, goals, findings, and plan of care. Pt/daughter verbalized understanding and were in full agreement with plan of care.  PT Evaluation Precautions/Restrictions Precautions Precautions: Fall Precaution Comments: Pushes to R side. Restrictions Weight Bearing Restrictions: No General   Vital SignsTherapy Vitals Temp: 98.3 F (36.8 C) Temp Source: Oral Pulse Rate: 79 Resp: 18 BP: 143/78 mmHg Patient Position (if appropriate): Lying Oxygen Therapy SpO2: 93 % O2 Device: None (Room air) Pain Pain Assessment Pain Assessment: No/denies pain Home Living/Prior Functioning Home Living Available Help at Discharge: Family;Available 24 hours/day;Available PRN/intermittently Type of Home: Mobile home Prior Function Vocation: Retired Vision/Perception    Pt wears glasses for reading only; reports no change in vision. Cognition Orientation Level: Oriented X4 Attention: Sustained Sustained Attention: Impaired Sustained Attention Impairment: Verbal basic;Functional basic Awareness: Impaired Awareness Impairment: Intellectual impairment Problem Solving: Impaired Problem Solving Impairment:  Functional basic Executive Function: Self Monitoring;Initiating Self Monitoring: Impaired Self Monitoring Impairment: Functional basic Sensation Sensation Light Touch: Appears Intact (bilat LE's) Stereognosis: Not tested Hot/Cold: Not tested Proprioception: Appears Intact (bilat LE's) Coordination Gross Motor Movements are Fluid and Coordinated: No Heel Shin Test: LLE WNL. Able to get into testing position with RLE, but no excursion with  RLE. Motor  Motor Motor: Hemiplegia;Abnormal tone;Abnormal postural alignment and control  Mobility Bed Mobility Bed Mobility: Supine to Sit Supine to Sit: 2: Max assist Supine to Sit Details: Tactile cues for sequencing;Verbal cues for technique;Verbal cues for sequencing Transfers Transfers: Yes Sit to Stand: 3: Mod assist;From chair/3-in-1 Sit to Stand Details: Manual facilitation for weight shifting;Tactile cues for weight beaing;Manual facilitation for placement Stand to Sit: 3: Mod assist Stand to Sit Details (indicate cue type and reason): Verbal cues for precautions/safety;Manual facilitation for weight shifting;Verbal cues for sequencing Stand Pivot Transfers: 2: Max assist Stand Pivot Transfer Details: Verbal cues for sequencing;Verbal cues for technique;Tactile cues for weight shifting;Other (comment) (to L side) Squat Pivot Transfers: 1: +2 Total assist Squat Pivot Transfer Details: Tactile cues for sequencing;Verbal cues for sequencing;Verbal cues for technique;Tactile cues for weight shifting;Verbal cues for precautions/safety;Other (comment) (to R side) Locomotion  Ambulation Ambulation: Yes Ambulation/Gait Assistance: 2: Max assist;Other (comment);1: +2 Total assist Ambulation Distance (Feet): 8 Feet Assistive device: Other (Comment) (L rail) Ambulation/Gait Assistance Details: Tactile cues for weight shifting;Verbal cues for technique;Tactile cues for sequencing;Verbal cues for gait pattern;Manual facilitation for  placement;Tactile cues for posture Ambulation/Gait Assistance Details: Pt performed gait x8' in controlled environment with LUE support at L hallway hand rail with Max A for stability, +2A for w/c follow; manual facilitation of lateral weight shifting to L side, manual stabilization of R knee during stance phase (to prevent both buckling and genu recurvatum), and manual adjustment of RLE step placement (due to excessive adduction). Gait Gait: Yes Gait Pattern: Step-to pattern;Decreased dorsiflexion - right;Right genu recurvatum;Trunk flexed;Trunk rotated posteriorly on right;Narrow base of support;Lateral trunk lean to right;Decreased weight shift to left;Decreased stance time - left Stairs / Additional Locomotion Stairs: No (Not assessed due to time constraint) Wheelchair Mobility Wheelchair Mobility: Yes Wheelchair Assistance: 2: Max Technical sales engineer Details: Verbal cues for precautions/safety;Visual cues/gestures for sequencing;Verbal cues for technique;Verbal cues for safe use of DME/AE;Other (comment) Wheelchair Propulsion: Both upper extremities Wheelchair Parts Management: Needs assistance Distance: 10'  Trunk/Postural Assessment  Cervical Assessment Cervical Assessment: Exceptions to [facility] (lower cervical flexion; upper cervical extension) Thoracic Assessment Thoracic Assessment: Exceptions to [facility] (thoracic kyphosis) Lumbar Assessment Lumbar Assessment: Exceptions to [facility] (posterior pelvic tilt) Postural Control Postural Control: Deficits on evaluation Trunk Control: In seated, pt with posterior preference, tendency toward lateral trunk lean to R side. In standing with bilay UE support at sink, pt pushed trunk to R side.  Balance Balance Balance Assessed: Yes Static Standing Balance Static Standing - Balance Support: Bilateral upper extremity supported;During functional activity Static Standing - Level of Assistance: 2: Max assist;3: Mod assist Static Standing -  Comment/# of Minutes: Pt performed static standing x2 minutes while NT assisted with brief change and hygiene; required mod A initially, max A after ~1.5 minutes due to increasingly more pushing trunk to R side. Visual feedback (mirror) effective; however, pt required reinforcement to use mirror every 15-20 seconds. Extremity Assessment  RLE Assessment RLE Assessment: Exceptions to [facility] RLE Strength RLE Overall Strength: Deficits Right Hip Flexion: 3-/5 Right Knee Flexion: 1/5 Right Knee Extension: 3-/5 Right Ankle Dorsiflexion: 0/5 Right Ankle Plantar Flexion: 2/5 RLE Tone RLE Tone: Mild RLE Tone Comments: Clonus x2 beats in R ankle plantarflexors. LLE Assessment LLE Assessment: Within Functional Limits  FIM:  FIM - Bed/Chair Transfer Bed/Chair Transfer: 1: Two helpers;2: Supine > Sit: Max A (lifting assist/Pt. 25-49%);2: Bed > Chair or W/C: Max A (lift and lower assist) FIM - Locomotion: Wheelchair Distance: 10' Locomotion: Wheelchair: 1: Travels less than 50 ft with moderate assistance (Pt: 50 - 74%) FIM - Locomotion: Ambulation Locomotion: Ambulation Assistive Devices: Other (comment) (L rail) Ambulation/Gait Assistance: 2: Max assist;Other (comment);1: +2 Total assist Locomotion: Ambulation: 1: Two helpers FIM - Locomotion: Stairs Locomotion: Stairs: 0: Activity did not occur (Not assessed due to time constraint)   Refer to Care Plan for Long Term Goals  Recommendations for other services: None  Discharge Criteria: Patient will be discharged from PT if patient refuses treatment 3 consecutive times without medical reason, if treatment goals not met, if there is a change in medical status, if patient makes no progress towards goals or if patient is discharged from hospital.  The above assessment, treatment plan, treatment alternatives and goals were discussed and mutually agreed upon: by patient and by family  Stefano Gaul 10/03/2013, 4:35 PM

## 2013-10-03 NOTE — Plan of Care (Signed)
Problem: RH BOWEL ELIMINATION Goal: RH STG MANAGE BOWEL W/MEDICATION W/ASSISTANCE STG Manage Bowel with Medication with supervision Assistance.  Outcome: Not Progressing Last bowel movement 9/29 ; laxatives ordered

## 2013-10-04 ENCOUNTER — Inpatient Hospital Stay (HOSPITAL_COMMUNITY): Payer: Medicare Other | Admitting: *Deleted

## 2013-10-04 NOTE — Progress Notes (Signed)
[place] PHYSICAL MEDICINE & REHABILITATION     PROGRESS NOTE    Subjective/Complaints: Had a good night. Moved bowels and is feeling better. No sob,cp, cough A  review of systems has been performed and if not noted above is otherwise negative.   Objective: Vital Signs: Blood pressure 172/74, pulse 72, temperature 98.3 F (36.8 C), temperature source Oral, resp. rate 18, height 5' (1.524 m), weight 53.9 kg (118 lb 13.3 oz), SpO2 97.00%. No results found. No results found for this basename: WBC, HGB, HCT, PLT,  in the last 72 hours No results found for this basename: NA, K, CL, CO, GLUCOSE, BUN, CREATININE, CALCIUM,  in the last 72 hours CBG (last 3)  No results found for this basename: GLUCAP,  in the last 72 hours  Wt Readings from Last 3 Encounters:  10/02/13 53.9 kg (118 lb 13.3 oz)  10/02/13 57.607 kg (127 lb)    Physical Exam:  Nursing note and vitals reviewed.  Constitutional: She is oriented to person, place, and time. She appears well-developed and well-nourished.  Intermittent congested cough noted. Tends to joke a lot  HENT: Oral mucosa pink and moist  Head: Normocephalic and atraumatic.  Eyes: Conjunctivae are normal. Pupils are equal, round, and reactive to light.  Neck: Normal range of motion. Neck supple.  Cardiovascular: Normal rate and regular rhythm.  Respiratory: Effort normal. She has wheezes (audible upper airway). She has no rales. She exhibits no tenderness.  GI: Soft. Bowel sounds are normal. She exhibits no distension. There is no tenderness.  Musculoskeletal: She exhibits no edema and no tenderness.  Neurological: She is alert and oriented to person, place, and time.  Speech clear. Able to follow two step commands without difficulty. Right hemiparesis--deltoid 3+/5, biceps/triceps 4/5, intrinsics 4/5, HF 3+/4, KE 3-/5, PF 2+/5, DF trace. Seems to sense pain/LT. Reasonable insight and awareness. Attends to right side. Language appears appropriate also   Skin: Skin is warm and dry.  Psychiatric: She has a normal mood and affect. Her behavior is normal. Judgment and thought content normal   Assessment/Plan: 1. Functional deficits secondary to left frontal infarct which require 3+ hours per day of interdisciplinary therapy in a comprehensive inpatient rehab setting. Physiatrist is providing close team supervision and 24 hour management of active medical problems listed below. Physiatrist and rehab team continue to assess barriers to discharge/monitor patient progress toward functional and medical goals. FIM: FIM - Bathing Bathing: 0: Activity did not occur  FIM - Upper Body Dressing/Undressing Upper body dressing/undressing: 0: Wears gown/pajamas-no public clothing FIM - Lower Body Dressing/Undressing Lower body dressing/undressing: 1: Total-Patient completed less than 25% of tasks  FIM - Toileting Toileting steps completed by patient: Performs perineal hygiene Toileting: 2: Max-Patient completed 1 of 3 steps  FIM - Radio producer Devices: Grab bars Toilet Transfers: 2-To toilet/BSC: Max A (lift and lower assist);2-From toilet/BSC: Max A (lift and lower assist)  FIM - Bed/Chair Transfer Bed/Chair Transfer: 1: Two helpers;2: Supine > Sit: Max A (lifting assist/Pt. 25-49%);2: Bed > Chair or W/C: Max A (lift and lower assist)  FIM - Locomotion: Wheelchair Distance: 10' Locomotion: Wheelchair: 1: Travels less than 50 ft with moderate assistance (Pt: 50 - 74%) FIM - Locomotion: Ambulation Locomotion: Ambulation Assistive Devices: Other (comment) (L rail) Ambulation/Gait Assistance: 2: Max assist;Other (comment);1: +2 Total assist Locomotion: Ambulation: 1: Two helpers  Comprehension Comprehension Mode: Auditory Comprehension: 5-Follows basic conversation/direction: With no assist  Expression Expression Mode: Verbal Expression: 5-Expresses complex 90% of  the time/cues < 10% of the time  Social  Interaction Social Interaction: 4-Interacts appropriately 75 - 89% of the time - Needs redirection for appropriate language or to initiate interaction.  Problem Solving Problem Solving: 4-Solves basic 75 - 89% of the time/requires cueing 10 - 24% of the time  Memory Memory: 5-Recognizes or recalls 90% of the time/requires cueing < 10% of the time  Medical Problem List and Plan:  1. 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 DVT Prophylaxis/Anticoagulation: Pharmaceutical: Lovenox  3. Pain Management: N/A  4. Depression/Mood: continue lexapro  5. Neuropsych: This patient is capable of making decisions on her own behalf.  6. Skin/Wound Care: Routine pressure relief measures  7. Fluids/Electrolytes/Nutrition: Monitor I/O. Offer supplements if intake poor. Follow up labs on admit  8. HTN: Blood pressures trending upward and medications resumed today. Permissive hypertension to allow perfusion. Will monitor every 8 hours and titrate medication to home dose.  9. Diabetes: No medications for past 1-2 years--reports she lost a lot of weight and last hgb A1c around 5. Will check hgb A1c. Fasting BS 108-120 range.  -dietary ed  10. Dyslipidemia: Continue lipitor.     LOS (Days) 2 A FACE TO FACE EVALUATION WAS PERFORMED  Xavien Dauphinais T 10/04/2013 8:43 AM

## 2013-10-04 NOTE — Plan of Care (Signed)
Problem: RH BLADDER ELIMINATION Goal: RH STG MANAGE BLADDER WITH ASSISTANCE STG Manage Bladder With min Assistance  Outcome: Not Progressing incont x 1 today

## 2013-10-04 NOTE — IPOC Note (Signed)
Overall Plan of Care [facility]) Patient Details Name: Candace Monroe MRN: [id number] DOB: Apr 20, 1940  Admitting Diagnosis: l cva  Hospital Problems: Active Problems:   CVA (cerebral infarction)     Functional Problem List: Nursing Behavior;Bladder;Bowel;Edema;Endurance;Medication Management;Motor;Nutrition;Pain;Perception;Safety;Sensory;Skin Integrity  PT [employer];Endurance;Motor;Perception;Safety;Other (comment) (Cognition)  OT Balance;Cognition;Endurance;Motor;Safety  SLP Cognition  TR         Basic ADL's: OT Grooming;Bathing;Dressing;Toileting     Advanced  ADL's: OT Laundry     Transfers: PT Bed Mobility;Bed to Chair;Car;Furniture  OT Toilet;Tub/Shower     Locomotion: PT Ambulation;Wheelchair Mobility;Stairs     Additional Impairments: OT    SLP Social Cognition   Problem Solving;Memory;Awareness  TR      Anticipated Outcomes Item Anticipated Outcome  Self Feeding n/a  Swallowing      Basic self-care  min A  Toileting  min A   Bathroom Transfers min A  Bowel/Bladder  Min assist  Transfers  Supervision  Locomotion  Min A to Mod A  Communication     Cognition  Supervision   Pain  <2 on a 0-10 scale  Safety/Judgment  Min assist   Therapy Plan: PT Intensity: Minimum of 1-2 x/day ,45 to 90 minutes PT Frequency: 5 out of 7 days PT Duration Estimated Length of Stay: 17-19 days OT Intensity: Minimum of 1-2 x/day, 45 to 90 minutes OT Frequency: 5 out of 7 days OT Duration/Estimated Length of Stay: 17-19 days SLP Intensity: Minumum of 1-2 x/day, 30 to 90 minutes SLP Frequency: 5 out of 7 days SLP Duration/Estimated Length of Stay: 17-19 days       Team Interventions: Nursing Interventions Patient/Family Education;Disease Management/Prevention;Pain Management;Medication Management;Skin Care/Wound Management;Discharge Planning;Psychosocial Support  PT interventions Ambulation/gait training;Balance/vestibular training;Cognitive  remediation/compensation;Disease management/prevention;Discharge planning;Functional mobility training;Patient/family education;Splinting/orthotics;Therapeutic Exercise;Therapeutic Activities;Visual/perceptual remediation/compensation;UE/LE Coordination activities;UE/LE Strength taining/ROM;Wheelchair propulsion/positioning;Stair Location manager;Neuromuscular re-education  OT Interventions Balance/vestibular training;Neuromuscular re-education;Patient/family education;Self Care/advanced ADL retraining;Therapeutic Exercise;UE/LE Coordination activities;Visual/perceptual remediation/compensation;UE/LE Strength taining/ROM;Therapeutic Activities;Psychosocial support;Functional mobility training;DME/adaptive equipment instruction;Discharge planning;Cognitive remediation/compensation  SLP Interventions Cognitive remediation/compensation;Cueing hierarchy;Functional tasks;Medication managment;Internal/external aids;Patient/family education  TR Interventions    SW/CM Interventions      Team Discharge Planning: Destination: PT-Home ,OT- Home , SLP-Home Projected Follow-up: PT-24 hour supervision/assistance;Home health PT, OT-  Home health OT, SLP-24 hour supervision/assistance;None Projected Equipment Needs: PT-To be determined, OT- 3 in 1 bedside comode;Tub/shower bench, SLP-None recommended by SLP Equipment Details: PT- , OT-has personal walker Patient/family involved in discharge planning: PT- Patient;Family member/caregiver,  OT-Patient, SLP-Patient;Family member/caregiver  MD ELOS: 17-19 days Medical Rehab Prognosis:  Excellent Assessment: The patient has been admitted for CIR therapies with the diagnosis of left CVA. The team will be addressing functional mobility, strength, stamina, balance, safety, adaptive techniques and equipment, self-care, bowel and bladder mgt, patient and caregiver education, cognition, communication, NMR, leisure awareness, community reintegration.  Goals have been set at min assist for ADL's/self care and supervision transfers, min-mod assist with locomotion, supervision cognition.    Meredith Staggers, MD, FAAPMR      See Team Conference Notes for weekly updates to the plan of care

## 2013-10-04 NOTE — Progress Notes (Signed)
Occupational Therapy Session Note  Patient Details  Name: JENTRY KRISTOFF MRN: [id number] Date of Birth: [birth year]  Today's Date: 10/04/2013 OT Individual Time:  -   1530-1600  (30 min)   -    Short Term Goals: Week 1:  OT Short Term Goal 1 (Week 1): Pt will perform toilet transfer with Mod A in order to increase I in functional transfers.  OT Short Term Goal 2 (Week 1): Pt will perform bathing with Mod A in order to increase I in self care. OT Short Term Goal 3 (Week 1): Pt will perform toileting with Mod A in order to increase I in functional transfers.  OT Short Term Goal 4 (Week 1): Pt will performed seated laundry task , folding clothes, with Mod A and mod cues for seated balance.    Skilled Therapeutic Interventions/Progress Updates:    Addressed RUE NMRE, sit to stand, postural control, standing balance.  Pt. Sitting in recliner upon OT arrival.  Performed RUE AROM in low ranges.  Pt reported her right shoulder hurts when she started using her arm to feed herself with.  Pain is located in Anterior aspect of AC joint.  Did exercises with pain less than 5/10.  Did sit to stand x3 with facilitation to hips and right knee contraction for weight bearing and control  Pt. Repositioned in recliner and left with call bell in hand.    Therapy Documentation Precautions:  Precautions Precautions: Fall Precaution Comments: Pushes to R side. Restrictions Weight Bearing Restrictions: No    Pain:  5/10 right shoulder;  Goes to 8 when feeding self  :    See FIM for current functional status  Therapy/Group: Individual Therapy  Lisa Roca 10/04/2013, 3:59 PM

## 2013-10-05 ENCOUNTER — Inpatient Hospital Stay (HOSPITAL_COMMUNITY): Payer: Medicare Other | Admitting: Occupational Therapy

## 2013-10-05 ENCOUNTER — Inpatient Hospital Stay (HOSPITAL_COMMUNITY): Payer: Medicare Other

## 2013-10-05 ENCOUNTER — Inpatient Hospital Stay (HOSPITAL_COMMUNITY): Payer: Self-pay | Admitting: Speech Pathology

## 2013-10-05 DIAGNOSIS — I6330 Cerebral infarction due to thrombosis of unspecified cerebral artery: Secondary | ICD-10-CM

## 2013-10-05 DIAGNOSIS — I10 Essential (primary) hypertension: Secondary | ICD-10-CM

## 2013-10-05 DIAGNOSIS — G8110 Spastic hemiplegia affecting unspecified side: Secondary | ICD-10-CM

## 2013-10-05 LAB — COMPREHENSIVE METABOLIC PANEL
ALBUMIN: 3 g/dL — AB (ref 3.5–5.2)
ALT: 13 U/L (ref 0–35)
AST: 18 U/L (ref 0–37)
Alkaline Phosphatase: 70 U/L (ref 39–117)
Anion gap: 11 (ref 5–15)
BILIRUBIN TOTAL: 0.4 mg/dL (ref 0.3–1.2)
BUN: 40 mg/dL — ABNORMAL HIGH (ref 6–23)
CO2: 21 mEq/L (ref 19–32)
CREATININE: 1.31 mg/dL — AB (ref 0.50–1.10)
Calcium: 9.2 mg/dL (ref 8.4–10.5)
Chloride: 107 mEq/L (ref 96–112)
GFR calc Af Amer: 45 mL/min — ABNORMAL LOW (ref >90)
GFR calc non Af Amer: 39 mL/min — ABNORMAL LOW (ref >90)
Glucose, Bld: 135 mg/dL — ABNORMAL HIGH (ref 70–99)
POTASSIUM: 4.7 meq/L (ref 3.7–5.3)
Sodium: 139 mEq/L (ref 137–147)
Total Protein: 6.1 g/dL (ref 6.0–8.3)

## 2013-10-05 LAB — CBC WITH DIFFERENTIAL/PLATELET
BASOS ABS: 0.1 10*3/uL (ref 0.0–0.1)
BASOS PCT: 1 % (ref 0–1)
Eosinophils Absolute: 0.1 10*3/uL (ref 0.0–0.7)
Eosinophils Relative: 2 % (ref 0–5)
HCT: 42 % (ref 36.0–46.0)
Hemoglobin: 14.4 g/dL (ref 12.0–15.0)
LYMPHS PCT: 20 % (ref 12–46)
Lymphs Abs: 1.9 10*3/uL (ref 0.7–4.0)
MCH: 30.8 pg (ref 26.0–34.0)
MCHC: 34.3 g/dL (ref 30.0–36.0)
MCV: 89.9 fL (ref 78.0–100.0)
MONO ABS: 0.9 10*3/uL (ref 0.1–1.0)
Monocytes Relative: 9 % (ref 3–12)
NEUTROS ABS: 6.6 10*3/uL (ref 1.7–7.7)
NEUTROS PCT: 68 % (ref 43–77)
PLATELETS: 205 10*3/uL (ref 150–400)
RBC: 4.67 MIL/uL (ref 3.87–5.11)
RDW: 12.3 % (ref 11.5–15.5)
WBC: 9.5 10*3/uL (ref 4.0–10.5)

## 2013-10-05 LAB — HEMOGLOBIN A1C
Hgb A1c MFr Bld: 6.2 % — ABNORMAL HIGH (ref <5.7)
MEAN PLASMA GLUCOSE: 131 mg/dL — AB (ref <117)

## 2013-10-05 MED ORDER — ENOXAPARIN SODIUM 30 MG/0.3ML ~~LOC~~ SOLN
30.0000 mg | SUBCUTANEOUS | Status: DC
  Administered 2013-10-05 – 2013-10-20 (×16): 30 mg via SUBCUTANEOUS
  Filled 2013-10-05 (×17): qty 0.3

## 2013-10-05 NOTE — Progress Notes (Signed)
[facility] Admission Coordinator Signed Physical Medicine and Rehabilitation PMR Pre-admission Service date: 10/02/2013 9:48 AM  Related encounter: Documentation from 10/02/2013 in [place] PMR Admission Coordinator Pre-Admission Assessment   Patient: Candace Monroe is an 73 y.o., female MRN: [id number] DOB: Aug 08, 1940 Height: 5' (152.4 cm) Weight: 57.607 kg (127 lb)   Insurance Information HMO: yes     PPO:      PCP:      IPA:      80/20:      OTHER:   PRIMARY: Medicare UHC      Policy#: [id number]      Subscriber: self CM Name: Willa Frater      Phone#: [phone]     Fax#: [phone] Approval given from [place] with [facility] Medicare on 10-2 for seven days, 10-02-13 through 10-09-13 with updates due to Dorthula Nettles at above fax on [phone] Pre-Cert#: [id number]      Employer: retired Benefits:  Phone #: [phone]     Name: Rolla Flatten. Date: 08-01-13             Deduct: none      Out of Pocket Max: [phone] (met $80.03)      Life Max: unlimited CIR: $430/day for days 1-4, pre-auth needed      SNF: $0/day for days 1-20; $155/day for days 21-64; $0/day for days 65-100 (max 100 days, pre-auth needed) Outpatient: 100%     Co-Pay: $40/visist, visit limit per medical necessity Home Health: 100%      Co-Pay: auth required at day 33 DME: 80%     Co-Pay: 20%   Providers: in network only  Emergency Facilities manager Information     Name  Relation  Home  Work  Zena T  Spouse  [phone]    [phone]     Caddie, Randle      [phone]         Current Medical History  Patient Admitting Diagnosis: Left frontal CVA History of Present Illness:  This 73 year old female with a history of HTN and tobacco dependence present to [facility] emergency room with right sided weakness on 09-29-13. She was sitting on her porch that day when she developed a sudden onset of right sided  pain and inability to move her right leg. Pt had to crawl inside to call her daughter and then EMS was called. CT head was negative and MRI findings revealed acute lacunar infarct in left frontal lobe. Further stroke work up completed and inpatient rehab was recommended by therapy.   Patient's medical record from [facility] has been reviewed by the rehabilitation admission coordinator and physician.   Past Medical History  No past medical history on file.   Family History  family history is not on file.   Prior Rehab/Hospitalizations: none               Current Medications See MAR   Patients Current Diet:  Heart healthy   Precautions / Restrictions Precautions Precautions: Fall    Prior Activity Level Limited Community (1-2x/wk): Pt got out 2-3 x/week but does not drive. She would mainly stay in the car and husband did the errands. Pt enjoys her family and sitting on her porch.   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)    Upper Body Dressing   Independent   Min assist    Lower Body Dressing   Independent   Max assist    Grooming   Independent   Min assist    Eating/Drinking   Independent   Min assist    Toilet Transfer   Independent   Other (not assessed, anticipate max assist x 2)    Bladder Continence     WFL   using bedpan    Bowel Management   WFL   no BM documented in [facility] system    Stair Climbing   Independent   Other (not assessed, anticipate needs)    Communication   [facility]   WFL, a bit "fiesty"     Memory   WFL   not assessed     Cooking/Meal Prep   Independent        Housework    Independent      Money Management   Independent      Driving   pt did not drive. Husband did the driving.         Special needs/care consideration BiPAP/CPAP no   CPM no   Continuous Drip IV no   Dialysis no           Life Vest no   Oxygen no   Special Bed no   Trach Size no   Wound Vac (area) no       Skin - no issues                                Bowel mgmt: no BM documented in [facility] system Bladder mgmt: using bedpan   Diabetic mgmt - pt was diabetic previously and per husband/son, "She was taken off her DM meds about a year ago and is doing fine."   Previous Home Environment Living Arrangements: Spouse/significant other  Lives With: Spouse Available Help at Discharge: Family Type of Home: Mobile home Home Layout: One level Home Access: Stairs to enter Entrance Stairs-Rails: Right;Left (rails can be adjusted or a ramp can be built per son) Technical brewer of Steps: 4   Discharge Living Setting Plans for Discharge Living Setting: Patient's home;Mobile Home Type of Home at Discharge: Mobile home Discharge Home Layout: One level Discharge Home Access: Stairs to enter Entrance Stairs-Rails: Right;Left (rails can be adjusted or ramp can be built per son) Technical brewer of Steps: 4 Does the patient have any problems obtaining your medications?: No   Social/Family/Support Systems Patient Roles: Spouse Contact Information: husband Joneen Caraway is primary contact Anticipated Caregiver: husband (son and dtr can also help as needed) (son does work but dtr can help as needed) Anticipated Ambulance person Information: see above Ability/Limitations of Caregiver: no limitations Caregiver Availability: 24/7 Discharge Plan Discussed with Primary Caregiver: Yes (discussed with pt and son on 10-01-13) Is Caregiver In Agreement with Plan?: Yes Does Caregiver/Family have Issues with Lodging/Transportation while Pt is in Rehab?: No (they may want to spend the night  with pt)   Goals/Additional Needs Patient/Family Goal for Rehab: Supervision and Min assist wtih PT/OT; Supervision and Mod Ind with SLP Expected length of stay: 10-14  days Cultural Considerations: none Dietary Needs: heart healthy Equipment Needs: to be determined Pt/Family Agrees to Admission and willing to participate: Yes Program Orientation Provided & Reviewed with Pt/Caregiver Including Roles  & Responsibilities: Yes   Patient Condition: I met with pt and her husband/son at [facility]) to explain the possibility of inpatient rehab. This 73 year old female was previously independent prior to this new L frontal CVA. She is currently requiring maximal assistance for bed mobility and maximal assistance of 2 for limited stand pivot transfers. She is displaying R LE weakness and is unable to take steps at this time. Her self care skills have also been impacted by the CVA. She is needing maximal assistance with bathing and dressing tasks and has decreased insight into her functional limitations. Patient will benefit from further skilled speech therapy to address higher level cognitive needs. She will benefit greatly from the multi-disciplinary team approach of skilled PT, OT, SLP and rehab nursing to maximize her function following this new CVA. PT, OT and rehab nursing will focus on increasing strength for greater independence with bed mobility, transfers, gait and self care tasks. Rehab nursing will also address pt/family education needs with medications and bowel/bladder care.  In addition, pt will benefit from rehab physician intervention to monitor medical issues following her CVA. Discussed case with Dr. Naaman Plummer who feels that pt is a good inpatient rehab candidate and received clearance from Dr. Benjie Karvonen at [facility]. Pt and her family are motivated to come to inpatient rehab and will benefit from the intensive services of skilled therapy under rehab physician guidance. Pt will  be admitted today on 10-02-13.   Preadmission Screen Completed By:  Nanetta Batty, PT, 10/02/2013 9:48 AM ______________________________________________________________________    Discussed status with Dr. Naaman Plummer on 10-02-13 at 1346 and received telephone approval for admission today.   Admission Coordinator:  Nanetta Batty, PT, time 1346/Date 10-02-13    Assessment/Plan: Diagnosis: left frontal cva Does the need for close, 24 hr/day  Medical supervision in concert with the patient's rehab needs make it unreasonable for this patient to be served in a less intensive setting? Yes Co-Morbidities requiring supervision/potential complications: htn, post-cva sequelae Due to bladder management, bowel management, safety, skin/wound care, disease management, medication administration, pain management and patient education, does the patient require 24 hr/day rehab nursing? Yes Does the patient require coordinated care of a physician, rehab nurse, PT (1-2 hrs/day, 5 days/week), OT (1-2 hrs/day, 5 days/week) and SLP (1-2 hrs/day, 5 days/week) to address physical and functional deficits in the context of the above medical diagnosis(es)? Yes Addressing deficits in the following areas: balance, endurance, locomotion, strength, transferring, bowel/bladder control, bathing, dressing, feeding, grooming, toileting, cognition, speech, language, swallowing and psychosocial support Can the patient actively participate in an intensive therapy program of at least 3 hrs of therapy 5 days a week? Yes The potential for patient to make measurable gains while on inpatient rehab is excellent Anticipated functional outcomes upon discharge from inpatients are: supervision and min assist PT, supervision and min assist OT, supervision SLP Estimated rehab length of stay to reach the above functional goals is: 18-24 days Does the patient have adequate social supports to accommodate these discharge functional goals?  Yes Anticipated D/C setting: Home Anticipated post D/C treatments: HH therapy and Outpatient therapy Overall Rehab/Functional Prognosis: excellent       RECOMMENDATIONS: This patient's condition is appropriate for continued rehabilitative care in the following setting: CIR Patient has agreed to participate in recommended program.  Yes Note that insurance prior authorization may be required for reimbursement for recommended care.   Comment: Admit to inpatient rehab today.    Shamir Tuzzolino, PT 10/02/2013  Revision History...     Date/Time User Action   10/02/2013 2:13 PM Meredith Staggers, MD Sign   10/02/2013 1:47 PM [place] Details Report

## 2013-10-05 NOTE — Progress Notes (Signed)
Physical Therapy Session Note  Patient Details  Name: Candace Monroe MRN: [id number] Date of Birth: 1940/11/22  Today's Date: 10/05/2013 PT Individual Time: 1003-1103 PT Individual Time Calculation (min): 60 min   Short Term Goals: Week 1:  PT Short Term Goal 1 (Week 1): Pt will consistently perform supine<>sit with min  A with HOB flat using rail. PT Short Term Goal 2 (Week 1): Pt will perform supine<>sit with min A with rails and HOB flat. PT Short Term Goal 3 (Week 1): Pt will consistently perform bed<>w/c transfer (both directions) with mod A. PT Short Term Goal 4 (Week 1): Pt will perform w/c mobility x50' with mod A and 50% cueing. PT Short Term Goal 5 (Week 1): Pt will perform gait x30' in controlled environment with max A of single therapist.  Skilled Therapeutic Interventions/Progress Updates:  Toilet> w/c transfer using wall bar with max assist.  +2 needed for clothing management. See FIM for details.  neuromuscular re-education via forced use, manual and VCs, mirror feedback for : Midline orientation in sitting with feet support> feet unsupported Anterior pelvic tilt "    " Trunk shortening/lengthening/rotating in sitting with reaching out of BOS to R and L; limited R trunk shortening and rotating noted.  In sitting, pt sits in posterior pelvic tilt, about 20 degrees to L from midline. In standing, pt pushes hard with L limbs, to R.  In sitting, pt continually presses down with L toes, and sits windswept to R. Midline orientation improved in sitting after tx.  Gait up/down 3 (5") steps with 2 rails, max assist ascending, total assist descending.  Daughter [name] observed and assisted during tx, and rolled pt back to room at end of session.    Therapy Documentation Precautions:  Precautions Precautions: Fall Precaution Comments: Pushes to R side.; midline disorientation Restrictions Weight Bearing Restrictions: No General:   Vital Signs:   Pain: Pain Assessment Pain  Assessment: No/denies pain   See FIM for current functional status  Therapy/Group: Individual Therapy  Samai Corea 10/05/2013, 12:46 PM

## 2013-10-05 NOTE — Progress Notes (Signed)
[facility] PHYSICAL MEDICINE & REHABILITATION     PROGRESS NOTE 73 year old female with history of HTN, diabetes (off medications), right breast mass, ongoing tobacco use who was admitted to [place] on 09/27/13 with right sided weakness. Blood pressure 240/100 at admission. CT head without acute changes. MRI brain done revealing acute lacunar infarct left frontal lobe peri-Rolandic white matter, moderate to severe underlying chronic small vessel disease. 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 Cardiac echo with EF 50-60% with no wall abnormality, trace MVR. She was evaluated by Dr. Irish Elders who recommended full strength ASA for thrombotic stroke due to uncontrolled HTN, tobacco use and diabetes    Subjective/Complaints: Denies breathing issues or pain, no bowel or bladder issues A  review of systems has been performed and if not noted above is otherwise negative.   Objective: Vital Signs: Blood pressure 135/68, pulse 70, temperature 98 F (36.7 C), temperature source Oral, resp. rate 18, height 5' (1.524 m), weight 53.9 kg (118 lb 13.3 oz), SpO2 95.00%. No results found.  Recent Labs  10/05/13 0602  WBC 9.5  HGB 14.4  HCT 42.0  PLT 205    Recent Labs  10/05/13 0602  NA 139  K 4.7  CL 107  GLUCOSE 135*  BUN 40*  CREATININE 1.31*  CALCIUM 9.2   CBG (last 3)  No results found for this basename: GLUCAP,  in the last 72 hours  Wt Readings from Last 3 Encounters:  10/02/13 53.9 kg (118 lb 13.3 oz)  10/02/13 57.607 kg (127 lb)    Physical Exam:  Nursing note and vitals reviewed.  Constitutional: She is oriented to person, place, and time. She appears well-developed and well-nourished.   HENT: Oral mucosa pink and moist  Head: Normocephalic and atraumatic.  Eyes: Conjunctivae are normal. Pupils are equal, round, and reactive to light.  Neck: Normal range of motion. Neck supple.  Cardiovascular: Normal rate and regular  rhythm.  Respiratory: Effort normal. . She has no rales. She exhibits no tenderness.  GI: Soft. Bowel sounds are normal. She exhibits no distension. There is no tenderness.  Musculoskeletal: She exhibits no edema and no tenderness.  Neurological: She is alert and oriented to person, place, and time.  Speech clear. Able to follow two step commands without difficulty. Right hemiparesis--deltoid 3+/5, biceps/triceps 4/5, intrinsics 4/5, HF 3+/4, KE 3-/5, PF 2+/5, DF trace. Seems to sense pain/LT. Reasonable insight and awareness. Attends to right side. Language appears appropriate also  Skin: Skin is warm and dry.  Psychiatric: She has a normal mood and affect. Her behavior is normal. Judgment and thought content normal   Assessment/Plan: 1. Functional deficits secondary to left frontal infarct which require 3+ hours per day of interdisciplinary therapy in a comprehensive inpatient rehab setting. Physiatrist is providing close team supervision and 24 hour management of active medical problems listed below. Physiatrist and rehab team continue to assess barriers to discharge/monitor patient progress toward functional and medical goals. FIM: FIM - Bathing Bathing: 1: Total-Patient completes 0-2 of 10 parts or less than 25%  FIM - Upper Body Dressing/Undressing Upper body dressing/undressing steps patient completed: Thread/unthread right sleeve of pullover shirt/dresss;Thread/unthread left sleeve of pullover shirt/dress Upper body dressing/undressing: 3: Mod-Patient completed 50-74% of tasks FIM - Lower Body Dressing/Undressing Lower body dressing/undressing: 0: Wears gown/pajamas-no public clothing  FIM - Toileting Toileting steps completed by patient: Performs perineal hygiene Toileting: 0: Activity did not occur  FIM - Air cabin crew  Transfers Assistive Devices: Grab bars Toilet Transfers: 2-To toilet/BSC: Max A (lift and lower assist);2-From toilet/BSC: Max A (lift and lower  assist)  FIM - Bed/Chair Transfer Bed/Chair Transfer: 2: Bed > Chair or W/C: Max A (lift and lower assist);1: Two helpers  FIM - Locomotion: Wheelchair Distance: 10' Locomotion: Wheelchair: 1: Travels less than 50 ft with moderate assistance (Pt: 50 - 74%) FIM - Locomotion: Ambulation Locomotion: Ambulation Assistive Devices: Other (comment) (L rail) Ambulation/Gait Assistance: 2: Max assist;Other (comment);1: +2 Total assist Locomotion: Ambulation: 1: Two helpers  Comprehension Comprehension Mode: Auditory Comprehension: 5-Understands basic 90% of the time/requires cueing < 10% of the time  Expression Expression Mode: Verbal Expression: 5-Expresses complex 90% of the time/cues < 10% of the time  Social Interaction Social Interaction: 4-Interacts appropriately 75 - 89% of the time - Needs redirection for appropriate language or to initiate interaction.  Problem Solving Problem Solving: 4-Solves basic 75 - 89% of the time/requires cueing 10 - 24% of the time  Memory Memory: 5-Recognizes or recalls 90% of the time/requires cueing < 10% of the time  Medical Problem List and Plan:  1. 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 DVT Prophylaxis/Anticoagulation: Pharmaceutical: Lovenox  3. Pain Management: N/A  4. Depression/Mood: continue lexapro  5. Neuropsych: This patient is capable of making decisions on her own behalf.  6. Skin/Wound Care: Routine pressure relief measures  7. Fluids/Electrolytes/Nutrition: Monitor I/O. Offer supplements if intake poor. Follow up labs on admit  8. HTN: Blood pressures trending upward and medications resumed today. Permissive hypertension to allow perfusion. Will monitor every 8 hours and titrate medication to home dose.  9. Diabetes: No medications for past 1-2 years--reports she lost a lot of weight and last hgb A1c around 5. Will check hgb A1c. Fasting BS 108-120 range.  -dietary ed  10.  Dyslipidemia: Continue lipitor.     LOS (Days) 3 A FACE TO FACE EVALUATION WAS PERFORMED  Candace Monroe 10/05/2013 7:07 AM

## 2013-10-05 NOTE — IPOC Note (Addendum)
Overall Plan of Care [facility]) Patient Details Name: Candace Monroe MRN: [id number] DOB: [birth year]  Admitting Diagnosis: l cva  Hospital Problems: Active Problems:   CVA (cerebral infarction)     Functional Problem List: Nursing Behavior;Bladder;Bowel;Edema;Endurance;Medication Management;Motor;Nutrition;Pain;Perception;Safety;Sensory;Skin Integrity  PT [employer];Endurance;Motor;Perception;Safety;Other (comment) (Cognition)  OT Balance;Cognition;Endurance;Motor;Safety  SLP Cognition  TR Activity tolerance, functional mobility, balance, cognition, safety       Basic ADL's: OT Grooming;Bathing;Dressing;Toileting     Advanced  ADL's: OT Laundry     Transfers: PT Bed Mobility;Bed to Chair;Car;Furniture  OT Toilet;Tub/Shower     Locomotion: PT Ambulation;Wheelchair Mobility;Stairs     Additional Impairments: OT    SLP Social Cognition   Problem Solving;Memory;Awareness  TR      Anticipated Outcomes Item Anticipated Outcome  Self Feeding n/a  Swallowing      Basic self-care  min A  Toileting  min A   Bathroom Transfers min A  Bowel/Bladder  Min assist  Transfers  Supervision  Locomotion  Min A to Mod A  Communication     Cognition  Supervision   Pain  <2 on a 0-10 scale  Safety/Judgment  Min assist   Therapy Plan: PT Intensity: Minimum of 1-2 x/day ,45 to 90 minutes PT Frequency: 5 out of 7 days PT Duration Estimated Length of Stay: 17-19 days OT Intensity: Minimum of 1-2 x/day, 45 to 90 minutes OT Frequency: 5 out of 7 days OT Duration/Estimated Length of Stay: 17-19 days SLP Intensity: Minumum of 1-2 x/day, 30 to 90 minutes SLP Frequency: 5 out of 7 days SLP Duration/Estimated Length of Stay: 17-19 days  TR Duration/ELOS: 3  weeks TR Frequency:  Min 1 time per week >20 minutes        Team Interventions: Nursing Interventions Patient/Family Education;Disease Management/Prevention;Pain Management;Medication Management;Skin Care/Wound  Management;Discharge Planning;Psychosocial Support  PT interventions Ambulation/gait training;Balance/vestibular training;Cognitive remediation/compensation;Disease management/prevention;Discharge planning;Functional mobility training;Patient/family education;Splinting/orthotics;Therapeutic Exercise;Therapeutic Activities;Visual/perceptual remediation/compensation;UE/LE Coordination activities;UE/LE Strength taining/ROM;Wheelchair propulsion/positioning;Stair Location manager;Neuromuscular re-education  OT Interventions Balance/vestibular training;Neuromuscular re-education;Patient/family education;Self Care/advanced ADL retraining;Therapeutic Exercise;UE/LE Coordination activities;Visual/perceptual remediation/compensation;UE/LE Strength taining/ROM;Therapeutic Activities;Psychosocial support;Functional mobility training;DME/adaptive equipment instruction;Discharge planning;Cognitive remediation/compensation  SLP Interventions Cognitive remediation/compensation;Cueing hierarchy;Functional tasks;Medication managment;Internal/external aids;Patient/family education  TR Interventions Recreation/leisure participation, Balance/Vestibular training, functional mobility, therapeutic activities, cognitive remediation/compensation; /UE/LE strength/coordination, w/c mobility, community reintegration, pt/family education, adaptive equipment instruction/use, discharge planning, psychosocial support  SW/CM Interventions      Team Discharge Planning: Destination: PT-Home ,OT- Home , SLP-Home Projected Follow-up: PT-24 hour supervision/assistance;Home health PT, OT-  Home health OT, SLP-24 hour supervision/assistance;None Projected Equipment Needs: PT-To be determined, OT- 3 in 1 bedside comode;Tub/shower bench, SLP-None recommended by SLP Equipment Details: PT- , OT-has personal walker Patient/family involved in discharge planning: PT- Patient;Family member/caregiver,  OT-Patient,  SLP-Patient;Family member/caregiver  MD ELOS: 10-14d Medical Rehab Prognosis:  Good Assessment: 73 year old female with history of HTN, diabetes (off medications), right breast mass, ongoing tobacco use who was admitted to [facility] on 09/27/13 with right sided weakness. Blood pressure 240/100 at admission. CT head without acute changes. MRI brain done revealing acute lacunar infarct left frontal lobe peri-Rolandic white matter, moderate to severe underlying chronic small vessel disease. 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 Cardiac echo with EF 50-60% with no wall abnormality, trace MVR. She was evaluated by Dr. Irish Elders who recommended full strength ASA for thrombotic stroke due to uncontrolled HTN, tobacco use and diabetes. 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  Treatment team will focus on ADLs and mobility with goals set at min/sup   See Team Conference Notes for weekly updates to the plan of care

## 2013-10-05 NOTE — Care Management Note (Signed)
[facility] Individual Statement of Services  Patient Name:  LEIGHANA PATIN  Date:  10/05/2013  Welcome to the [facility].  Our goal is to provide you with an individualized program based on your diagnosis and situation, designed to meet your specific needs.  With this comprehensive rehabilitation program, you will be expected to participate in at least 3 hours of rehabilitation therapies Monday-Friday, with modified therapy programming on the weekends.  Your rehabilitation program will include the following services:  Physical Therapy (PT), Occupational Therapy (OT), Speech Therapy (ST), 24 hour per day rehabilitation nursing, Case Management (Social Worker), Rehabilitation Medicine, Nutrition Services and Pharmacy Services  Weekly team conferences will be held on Wednesday to discuss your progress.  Your Social Worker will talk with you frequently to get your input and to update you on team discussions.  Team conferences with you and your family in attendance may also be held.  Expected length of stay: 17-19 days Overall anticipated outcome: min level  Depending on your progress and recovery, your program may change. Your Social Worker will coordinate services and will keep you informed of any changes. Your Social Worker's name and contact numbers are listed  below.  The following services may also be recommended but are not provided by the [facility]:  [place] will be made to provide these services after discharge if needed.  Arrangements include referral to agencies that provide these services.  Your insurance has been verified to be:  [employer]-Medicare Your primary doctor is:  Abbie Sons [facility]  Pertinent information will be shared with your doctor and your insurance company.  Social Worker:  Ovidio Kin, [place] or ([phone]  Information discussed with and copy given to patient by: Elease Hashimoto, 10/05/2013, 10:02 AM

## 2013-10-05 NOTE — Progress Notes (Signed)
Patient information reviewed and entered into eRehab system by Yashua Bracco, RN, CRRN, PPS Coordinator.  Information including medical coding and functional independence measure will be reviewed and updated through discharge.     Per nursing patient was given "Data Collection Information Summary for Patients in Inpatient Rehabilitation Facilities with attached "Privacy Act Statement-Health Care Records" upon admission.  

## 2013-10-05 NOTE — Progress Notes (Signed)
Occupational Therapy Session Note  Patient Details  Name: Candace Monroe MRN: [id number] Date of Birth: [birth year]  Today's Date: 10/05/2013 OT Individual Time: 0805-0905 OT Individual Time Calculation (min): 60 min    Short Term Goals: Week 1:  OT Short Term Goal 1 (Week 1): Pt will perform toilet transfer with Mod A in order to increase I in functional transfers.  OT Short Term Goal 2 (Week 1): Pt will perform bathing with Mod A in order to increase I in self care. OT Short Term Goal 3 (Week 1): Pt will perform toileting with Mod A in order to increase I in functional transfers.  OT Short Term Goal 4 (Week 1): Pt will performed seated laundry task , folding clothes, with Mod A and mod cues for seated balance.   Skilled Therapeutic Interventions/Progress Updates:    Pt seen for BADL retraining of bathing at sink level and dressing with a focus on postural control, use of RUE, functional transfers and sit to stand/ standing balance. Pt's daughter present and her spouse arrived for the second half of the session. Pt stated she was highly motivated to work hard to go home. She did very well with cues and mod a to move from supine to sidelying to sit. Pt worked on postural control and sitting balance at EOB for 5 minutes with supervision. Pt able to raise both arms over her head and maintain her balance.  Pt did not need cues to lean forward and to the left to prepare for a squat pivot transfer to the R from bed to w/c. Pt did need max A to guide her hips up and to the R. Pt was supervision with UB bathing and min UB dressing with good use of R hand holding wash cloth.  She was also able to come into a stand at the sink several times with only min A and then mod to max to stabilize balance during standing with LB self care. Pt also tolerated the activity well with minimal rest breaks. Pt left sitting in w/c at end of session with call light in reach and family in the room with her. Reviewed safety  precautions to use the call light and no transfers alone or with family, only staff.  Therapy Documentation Precautions:  Precautions Precautions: Fall Precaution Comments: Pushes to R side. Restrictions Weight Bearing Restrictions: Yes    Vital Signs: Therapy Vitals Temp: 98 F (36.7 C) Temp Source: Oral Pulse Rate: 70 Resp: 18 BP: 135/68 mmHg Patient Position (if appropriate): Lying Oxygen Therapy SpO2: 95 % O2 Device: None (Room air) Pain: Pain Assessment Pain Assessment: No/denies pain ADL:  See FIM for current functional status  Therapy/Group: Individual Therapy  Bethel Sirois 10/05/2013, 9:15 AM

## 2013-10-05 NOTE — Progress Notes (Signed)
Social Work Assessment and Plan Social Work Assessment and Plan  Patient Details  Name: Candace Monroe MRN: [id number] Date of Birth: 09/03/1940  Today's Date: 10/05/2013  Problem List:  Patient Active Problem List   Diagnosis Date Noted  . CVA (cerebral infarction) 10/02/2013   Past Medical History:  Past Medical History  Diagnosis Date  . HTN (hypertension)   . Depression   . Breast mass    Past Surgical History:  Past Surgical History  Procedure Laterality Date  . Tonsillectomy    . Abdominal hysterectomy    . Cholecystectomy     Social History:  has no tobacco, alcohol, and drug history on file.  Family / Support Systems Marital Status: Married Patient Roles: Spouse;Parent Spouse/Significant Other: Joneen Caraway  [phone]  [phone] Children: Jeff-son  [phone] Other Supports: Materials engineer currenlty staying with pt in the hospital Anticipated Caregiver: Husband and children Ability/Limitations of Caregiver: Husband has health issues, but daughte ris in good health and staying here with pt. Caregiver Availability: 24/7 Family Dynamics: Close knit family they have five children all of which are close by and involved and supportive.  They are committed to pt and will provide the care she requires when it is time to go home.  Social History Preferred language: English Religion: Patient Refused Cultural Background: No issues Education: High School Read: Yes Write: Yes Employment Status: Retired Freight forwarder Issues: No issues Guardian/Conservator: None-according to MD pt is capable of making her own decisions while here.   Abuse/Neglect Physical Abuse: Denies Verbal Abuse: Denies Sexual Abuse: Denies Exploitation of patient/patient's resources: Denies Self-Neglect: Denies  Emotional Status Pt's affect, behavior adn adjustment status: Pt is motivated to improve and regain her use of her arm and leg.  She feels she has already  made good progress and can move more than she was yesterday.  She has always been independent and wants to be again. Recent Psychosocial Issues: Other health issues but was managing and doing well, or so she thought. Pyschiatric History: No history deferred depression screening due to pt feels she is doing well with all of this and doesn't need to be screened. Substance Abuse History: No issues  Patient / Family Perceptions, Expectations & Goals Pt/Family understanding of illness & functional limitations: Pt and family can explain her stroke and deficits.  Both husband and daughter are very involved and here daily, while daughter is staying over night with pt.  They talk wiht the MD and feel their concerns and questions are being answered. Premorbid pt/family roles/activities: Mother, Grandmother, Retiree, Church member, etc Anticipated changes in roles/activities/participation: resume Pt/family expectations/goals: Pt states: " I want to be able to dofor myself before I leave here."  Husband states: " We will see how she does here."  Daughter states: " I hope she does well but will help her with whatever she needs done."  [employer]: None Premorbid Home Care/DME Agencies: None Transportation available at discharge: Husband and children Resource referrals recommended: Support group (specify) (CVA SUpport group)  Discharge Planning Living Arrangements: Spouse/significant other Support Systems: Spouse/significant other;Children;Friends/neighbors;Church/faith community;Other relatives Type of Residence: Private residence Insurance Resources: Multimedia programmer (specify) Primary school teacher) Financial Resources: Radio broadcast assistant Screen Referred: No Living Expenses: Lives with family Money Management: Spouse;Patient Does the patient have any problems obtaining your medications?: No Home Management: Both she and husband Patient/Family Preliminary Plans: Retrun home  with husband and have children coming in to assist.  Aware she may require 24 hr care upon discharge.  Pt wants to do for herself and not need or burden her children.  Daughter is staying here with Mom and involved in her therapies.  Will include her when appropriate. Social Work Anticipated Follow Up Needs: HH/OP;Support Group  Clinical Impression Pleasant female who is sitting in bed with her curlers in, trying to exercise her affected side.  Her daughter and husband are very supportive and involved. Aware she will need 24 hr care upon discharge, but are still hoping she will do better than team thinks.  Will work with all on a safe discharge plan.  Elease Hashimoto 10/05/2013, 12:49 PM

## 2013-10-05 NOTE — Progress Notes (Signed)
Speech Language Pathology Daily Session Note  Patient Details  Name: Candace Monroe MRN: [id number] Date of Birth: [birth year]  Today's Date: 10/05/2013 SLP Individual Time: [id number] SLP Individual Time Calculation (min): 61 min  Short Term Goals: Week 1: SLP Short Term Goal 1 (Week 1): Pt will label 2 physical and 1 cognitive change post CVA with Min question cues  SLP Short Term Goal 2 (Week 1): Pt will request help as needed with Mod question cues SLP Short Term Goal 3 (Week 1): Pt will demonstrate complex problem solving with Min assist to self-monitor and correct errors SLP Short Term Goal 4 (Week 1): Pt will utilize external aids to assist with recal of new information with Min cues  Skilled Therapeutic Interventions:  Pt was seen for skilled speech therapy targeting cognitive goals.  Upon arrival, pt was partially reclined in bed, daughter at bedside with report of needing to use the restroom.  Pt transported to bathroom with SLP and nurse tech and was continent of bladder.  Pt benefited from min cues for safety while sitting on toilet as she exhibited a tendency to push to one side with decreased awareness that she was doing so.  SLP facilitated the session with a structured medication management task targeting use of a written aid and a pill box to facilitate improved organization and recall for taking scheduled medications.  Pt recalled the function of her currently scheduled medications when named for ~75% accuracy with min cuing.  Her recall of previously known medications was better than her recall of new medications.  With the use of a written aid, pt was able to load a pill box with her medications of varying dosages and frequencies for 100% accuracy with initial instructional cues, faded to supervision for error awareness. Continue per current plan of care.    FIM:  Comprehension Comprehension Mode: Auditory Comprehension: 5-Understands basic 90% of the time/requires cueing < 10%  of the time Expression Expression Mode: Verbal Expression: 5-Expresses complex 90% of the time/cues < 10% of the time Social Interaction Social Interaction: 4-Interacts appropriately 75 - 89% of the time - Needs redirection for appropriate language or to initiate interaction. Problem Solving Problem Solving: 4-Solves basic 75 - 89% of the time/requires cueing 10 - 24% of the time Memory Memory: 4-Recognizes or recalls 75 - 89% of the time/requires cueing 10 - 24% of the time  Pain Pain Assessment Pain Assessment: No/denies pain  Therapy/Group: Individual Therapy  Candace Monroe, M.A. CCC-SLP  Candace Monroe, Selinda Orion 10/05/2013, 3:40 PM

## 2013-10-06 ENCOUNTER — Inpatient Hospital Stay (HOSPITAL_COMMUNITY): Payer: Self-pay | Admitting: Speech Pathology

## 2013-10-06 ENCOUNTER — Inpatient Hospital Stay (HOSPITAL_COMMUNITY): Payer: Medicare Other | Admitting: *Deleted

## 2013-10-06 ENCOUNTER — Inpatient Hospital Stay (HOSPITAL_COMMUNITY): Payer: Medicare Other | Admitting: Occupational Therapy

## 2013-10-06 DIAGNOSIS — G8110 Spastic hemiplegia affecting unspecified side: Secondary | ICD-10-CM | POA: Diagnosis present

## 2013-10-06 NOTE — Progress Notes (Signed)
Recreational Therapy Assessment and Plan  Patient Details  Name: Candace Monroe MRN: [id number] Date of Birth: [birth year] Today's Date: 10/06/2013  Rehab Potential: Good ELOS: 3 weeks   Assessment Clinical Impression: Problem List:  Patient Active Problem List    Diagnosis  Date Noted   .  CVA (cerebral infarction)  10/02/2013    Past Medical History:  Past Medical History   Diagnosis  Date   .  HTN (hypertension)    .  Depression    .  Breast mass     Past Surgical History:  Past Surgical History   Procedure  Laterality  Date   .  Tonsillectomy     .  Abdominal hysterectomy     .  Cholecystectomy      Assessment & Plan  Clinical Impression: Candace Monroe is s 73 year old female with history of HTN, diabetes (off medications), right breast mass, ongoing tobacco use who was admitted to [facility] on 09/27/13 with right sided weakness. Blood pressure 240/100 at admission. CT head without acute changes. MRI brain done revealing acute lacunar infarct left frontal lobe peri-Rolandic white matter, moderate to severe underlying chronic small vessel disease. 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 Cardiac echo with EF 50-60% with no wall abnormality, trace MVR. She was evaluated by Dr. Irish Elders who recommended full strength ASA for thrombotic stroke due to uncontrolled HTN, tobacco use and diabetes. 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 Patient transferred to CIR on 10/02/2013.   Pt presents with decreased activity tolerance, decreased functional mobility, decreased balance, right inattention, right sided weakness, decreased attention, decreased awareness, decreased problem solving, decreased memory Limiting pt's independence with leisure/community pursuits.   Leisure History/Participation Premorbid leisure  interest/current participation: Petra Kuba - Optician, dispensing Psychosocial / Spiritual Patient agreeable to Pet Therapy: Yes Does patient have pets?: No Social interaction - Mood/Behavior: Cooperative  Plan Rec Therapy Plan Is patient appropriate for Therapeutic Recreation?: Yes Rehab Potential: Good Treatment times per week: Min 1 time per week >20 minutes Estimated Length of Stay: 3 weeks TR Treatment/Interventions: Adaptive equipment instruction;1:1 session;Balance/vestibular training;Functional mobility training;Community reintegration;Cognitive remediation/compensation;Patient/family education;Therapeutic activities;Recreation/leisure participation;Therapeutic exercise;UE/LE Coordination activities;Wheelchair propulsion/positioning  Recommendations for other services: None  Discharge Criteria: Patient will be discharged from TR if patient refuses treatment 3 consecutive times without medical reason.  If treatment goals not met, if there is a change in medical status, if patient makes no progress towards goals or if patient is discharged from hospital.  The above assessment, treatment plan, treatment alternatives and goals were discussed and mutually agreed upon: by patient  [place] 10/06/2013, 4:57 PM

## 2013-10-06 NOTE — Progress Notes (Signed)
Speech Language Pathology Daily Session Note  Patient Details  Name: Candace Monroe MRN: [id number] Date of Birth: Aug 15, 1940  Today's Date: 10/06/2013 SLP Individual Time: 1003-1033 SLP Individual Time Calculation (min): 30 min  Short Term Goals: Week 1: SLP Short Term Goal 1 (Week 1): Pt will label 2 physical and 1 cognitive change post CVA with Min question cues  SLP Short Term Goal 2 (Week 1): Pt will request help as needed with Mod question cues SLP Short Term Goal 3 (Week 1): Pt will demonstrate complex problem solving with Min assist to self-monitor and correct errors SLP Short Term Goal 4 (Week 1): Pt will utilize external aids to assist with recal of new information with Min cues  Skilled Therapeutic Interventions:  Pt was seen for skilled speech therapy targeting cognitive goals.  Upon arrival, pt was seated upright in wheelchair with daughter present at bedside.  Pt recalled therapeutic activities from previous therapy session with min question cues.  SLP facilitated the session with a semi-complex problem solving task targeting numerical reasoning for financial management.  Pt sorted a list of transactions into withdrawals and deposits and completed functional math calculations for 100% accuracy with min assist faded to supervision.  Continue per current plan of care.   FIM:  Comprehension Comprehension Mode: Auditory Comprehension: 5-Follows basic conversation/direction: With extra time/assistive device Expression Expression Mode: Verbal Expression: 5-Expresses basic needs/ideas: With extra time/assistive device Social Interaction Social Interaction: 4-Interacts appropriately 75 - 89% of the time - Needs redirection for appropriate language or to initiate interaction. Problem Solving Problem Solving: 4-Solves basic 75 - 89% of the time/requires cueing 10 - 24% of the time Memory Memory: 4-Recognizes or recalls 75 - 89% of the time/requires cueing 10 - 24% of the  time  Pain Pain Assessment Pain Assessment: No/denies pain  Therapy/Group: Individual Therapy  Windell Moulding, M.A. CCC-SLP  Elspeth Blucher, Selinda Orion 10/06/2013, 3:56 PM

## 2013-10-06 NOTE — Plan of Care (Signed)
Problem: Food- and Nutrition-Related Knowledge Deficit (NB-1.1) Goal: Nutrition education Formal process to instruct or train a patient/client in a skill or to impart knowledge to help patients/clients voluntarily manage or modify food choices and eating behavior to maintain or improve health. Outcome: Completed/Met Date Met:  10/06/13  RD consulted for nutrition education regarding diabetes and low salt diet.   Pt reports that she has been off DM medications since last year at the advice of her PCP.  Pt has been managing with diet.  Pt has not been educated on low sodium diet but knew the basics.     Lab Results  Component Value Date    HGBA1C 6.2* 10/05/2013   RD provided plate method handout. Discussed different food groups and their effects on blood sugar, emphasizing carbohydrate-containing foods.  Discussed importance of controlled and consistent carbohydrate intake throughout the day. Provided examples of ways to balance meals/snacks and encouraged intake of high-fiber, whole grain complex carbohydrates.  RD provided "Heart Healthy Nutrition Therapy" handout from the Academy of [employer]. Reviewed patient's dietary recall. Provided examples on ways to decrease sodium and fat intake in diet. Discouraged intake of processed foods and use of salt shaker. Encouraged fresh fruits and vegetables as well as whole grain sources of carbohydrates to maximize fiber intake. Teach back method used.  Expect good compliance.  Body mass index is 23.21 kg/(m^2). WNL  Current diet order is Carb modified/heart healthy, patient is consuming approximately 100% of meals at this time. Labs and medications reviewed. No further nutrition interventions warranted at this time. RD contact information provided. If additional nutrition issues arise, please re-consult RD..    RD, LDN, CNSC [phone] Pager [phone] After Hours Pager         

## 2013-10-06 NOTE — Progress Notes (Addendum)
[facility] PHYSICAL MEDICINE & REHABILITATION     PROGRESS NOTE 73 year old female with history of HTN, diabetes (off medications), right breast mass, ongoing tobacco use who was admitted to [facility] on 09/27/13 with right sided weakness. Blood pressure 240/100 at admission. CT head without acute changes. MRI brain done revealing acute lacunar infarct left frontal lobe peri-Rolandic white matter, moderate to severe underlying chronic small vessel disease. 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 Cardiac echo with EF 50-60% with no wall abnormality, trace MVR. She was evaluated by Dr. Irish Elders who recommended full strength ASA for thrombotic stroke due to uncontrolled HTN, tobacco use and diabetes    Subjective/Complaints: Denies breathing issues or pain, no bowel or bladder issues Taking 60-90% meals fluid intake measured at 754ml yesterday, encouraged intake  A  review of systems has been performed and if not noted above is otherwise negative.   Objective: Vital Signs: Blood pressure 167/64, pulse 72, temperature 97.8 F (36.6 C), temperature source Oral, resp. rate 18, height 5' (1.524 m), weight 53.9 kg (118 lb 13.3 oz), SpO2 94.00%. No results found.  Recent Labs  10/05/13 0602  WBC 9.5  HGB 14.4  HCT 42.0  PLT 205    Recent Labs  10/05/13 0602  NA 139  K 4.7  CL 107  GLUCOSE 135*  BUN 40*  CREATININE 1.31*  CALCIUM 9.2   CBG (last 3)  No results found for this basename: GLUCAP,  in the last 72 hours  Wt Readings from Last 3 Encounters:  10/02/13 53.9 kg (118 lb 13.3 oz)  10/02/13 57.607 kg (127 lb)    Physical Exam:  Nursing note and vitals reviewed.  Constitutional: She is oriented to person, place, and time. She appears well-developed and well-nourished.   HENT: Oral mucosa pink and moist  Head: Normocephalic and atraumatic.  Eyes: Conjunctivae are normal. Pupils are equal, round, and reactive to light.  Neck:  Normal range of motion. Neck supple.  Cardiovascular: Normal rate and regular rhythm.  Respiratory: Effort normal. . She has no rales. She exhibits no tenderness.  GI: Soft. Bowel sounds are normal. She exhibits no distension. There is no tenderness.  Musculoskeletal: She exhibits no edema and no tenderness.  Neurological: She is alert and oriented to person, place, and time. Sensory intake Speech clear. Able to follow two step commands without difficulty. Right hemiparesis--deltoid 3+/5, biceps/triceps 4/5, intrinsics 4/5, HF 3+/4, KE 3-/5, PF 3-/5, DF trace. Skin: Skin is warm and dry. On ext, no evidence of stasis dermatitis or heel breakdown Psychiatric: She has a normal mood and affect. Her behavior is normal. Judgment and thought content normal   Assessment/Plan: 1. Functional deficits secondary to left frontal infarct which require 3+ hours per day of interdisciplinary therapy in a comprehensive inpatient rehab setting. Physiatrist is providing close team supervision and 24 hour management of active medical problems listed below. Physiatrist and rehab team continue to assess barriers to discharge/monitor patient progress toward functional and medical goals. FIM: FIM - Bathing Bathing Steps Patient Completed: Chest;Right Arm;Left Arm;Abdomen;Front perineal area;Right upper leg;Left upper leg Bathing: 3: Mod-Patient completes 5-7 48f 10 parts or 50-74%  FIM - Upper Body Dressing/Undressing Upper body dressing/undressing steps patient completed: Thread/unthread right sleeve of pullover shirt/dresss;Thread/unthread left sleeve of pullover shirt/dress;Put head through opening of pull over shirt/dress Upper body dressing/undressing: 4: Min-Patient completed 75 plus % of tasks FIM - Lower Body Dressing/Undressing Lower body dressing/undressing steps patient completed: Thread/unthread  left pants leg Lower body dressing/undressing: 1: Total-Patient completed less than 25% of tasks  FIM -  Toileting Toileting steps completed by patient: Performs perineal hygiene Toileting Assistive Devices: Grab bar or rail for support Toileting: 1: Two helpers  FIM - Radio producer Devices: Grab bars Toilet Transfers: 1-From toilet/BSC: Total A (helper does all/Pt. < 25%)  FIM - Bed/Chair Transfer Bed/Chair Transfer: 3: Chair or W/C > Bed: Mod A (lift or lower assist);3: Bed > Chair or W/C: Mod A (lift or lower assist)  FIM - Locomotion: Wheelchair Distance: 10' Locomotion: Wheelchair: 1: Total Assistance/staff pushes wheelchair (Pt<25%) FIM - Locomotion: Ambulation Locomotion: Ambulation Assistive Devices: Other (comment) (L rail) Ambulation/Gait Assistance: 2: Max assist;Other (comment);1: +2 Total assist Locomotion: Ambulation: 0: Activity did not occur  Comprehension Comprehension Mode: Auditory Comprehension: 5-Follows basic conversation/direction: With no assist  Expression Expression Mode: Verbal Expression: 5-Expresses basic needs/ideas: With no assist  Social Interaction Social Interaction: 5-Interacts appropriately 90% of the time - Needs monitoring or encouragement for participation or interaction.  Problem Solving Problem Solving: 4-Solves basic 75 - 89% of the time/requires cueing 10 - 24% of the time  Memory Memory: 5-Recognizes or recalls 90% of the time/requires cueing < 10% of the time  Medical Problem List and Plan:  1. 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 DVT Prophylaxis/Anticoagulation: Pharmaceutical: Lovenox  3. Pain Management: N/A  4. Depression/Mood: continue lexapro  5. Neuropsych: This patient is capable of making decisions on her own behalf.  6. Skin/Wound Care: Routine pressure relief measures , 7. Fluids/Electrolytes/Nutrition: Monitor I/O. Offer supplements if intake poor. Follow up labs on admit  8. HTN: Blood pressures trending upward and medications  resumed today. Permissive hypertension to allow perfusion. Will monitor every 8 hours and titrate medication to home dose.  9. Diabetes: No medications for past 1-2 years--reports she lost a lot of weight and last hgb A1c around 5. Will check hgb A1c. Fasting BS 108-135 range.  -dietary ed  10. Dyslipidemia: Continue lipitor.     LOS (Days) 4 A FACE TO FACE EVALUATION WAS PERFORMED  Charlett Blake 10/06/2013 7:37 AM

## 2013-10-06 NOTE — Progress Notes (Signed)
Occupational Therapy Session Note  Patient Details  Name: Candace Monroe MRN: [id number] Date of Birth: Dec 18, 1940  Today's Date: 10/06/2013 OT Individual Time: 0800-0900 OT Individual Time Calculation (min): 60 min    Short Term Goals: Week 1:  OT Short Term Goal 1 (Week 1): Pt will perform toilet transfer with Mod A in order to increase I in functional transfers.  OT Short Term Goal 2 (Week 1): Pt will perform bathing with Mod A in order to increase I in self care. OT Short Term Goal 3 (Week 1): Pt will perform toileting with Mod A in order to increase I in functional transfers.  OT Short Term Goal 4 (Week 1): Pt will performed seated laundry task , folding clothes, with Mod A and mod cues for seated balance.   Skilled Therapeutic Interventions/Progress Updates:      Pt seen for BADL retraining of bathing at shower level and dressing with a focus on postural control, stand pivot versus squat pivot transfers, and use of R hand.  Pt did well following steps with bed mobility and only required min A.  Completed stand pivot with mod A.  Pt and her daughter anxious for her to shower. Pt used a standpivot with the grab bars to transfer to shower with only min A due to using B hands on bars. She did well maintaining sitting balance on tub bench with 3 cues needed to correct balance when she leaned too far to the right. She stood with B hands on bars to have A with washing her bottom.  Pt is no longer pushing to the right excessively. She actively used R arm with donning shirt, but needed assist with pulling up her pants due to motor impersistence and decreased pinch/grasp strength. She was able to actively comb her hair with her R hand.  Pt completed self care and then worked on wall slides from sitting in w/c to increase shoulder AROM and strength.. Pt resting in w/c at end of session with call light in reach and daughter in the room with her.  Therapy Documentation Precautions:   Precautions Precautions: Fall Precaution Comments: Pushes to R side.; midline disorientation Restrictions Weight Bearing Restrictions: No    Pain: Pain Assessment Pain Assessment: No/denies pain ADL:  See FIM for current functional status  Therapy/Group: Individual Therapy  Koula Venier 10/06/2013, 9:33 AM

## 2013-10-06 NOTE — Progress Notes (Signed)
Physical Therapy Session Note  Patient Details  Name: Candace Monroe MRN: [id number] Date of Birth: 12-15-1940  Today's Date: 10/06/2013 PT Individual Time:  11:33-12:03 (66min)  And 14:00-15:00 (6min)   Short Term Goals: Week 1:  PT Short Term Goal 1 (Week 1): Pt will consistently perform supine<>sit with min  A with HOB flat using rail. PT Short Term Goal 2 (Week 1): Pt will perform supine<>sit with min A with rails and HOB flat. PT Short Term Goal 3 (Week 1): Pt will consistently perform bed<>w/c transfer (both directions) with mod A. PT Short Term Goal 4 (Week 1): Pt will perform w/c mobility x50' with mod A and 50% cueing. PT Short Term Goal 5 (Week 1): Pt will perform gait x30' in controlled environment with max A of single therapist.  Skilled Therapeutic Interventions/Progress Updates:  First tx focused on functional mobility training and NMR via forced use, manual facilitation, and multi-modal cues for sitting and standing balance. Pt fatigued at start of tx, but agreeable to bedside tx.   Therapeutic activity Pt propelled WC in hall with bil UEs 2x25' with Min A for steering.  Performed max A squat-pivot transfer WC.bed with lifting and turning assist.  Sit>supine via sidelying with Max A for bil LEs and trunk steadying.   NMR Performed static and dynamic sitting EOB with hands in lap and reaching otuside BOS with bil UEs for cones, crossing midline for placing.  Performed sit<>stands at EOB, placing UEs on PT's shoudlers 2x35min with manual facilitation for RLE ext and cues for midline orientation. Pt tending to drift R, needing fading cues for midline orientation. Pt able to extend RLE x3-5 sec at a time. Increased challenge with lateral weight shifts and marching in place with max weight shifting and RLE blocking.  Performed supine ankle pumps, R heel slides, bridging and hemi-bridging x10 each with tactile cues.   ___________________________  Second tx focused on gait  training, functional mobility, and NMR via forced use, manual facilitation, and multi-modal cues. Pt resting in bed, agreeable to PT.    Therapeutic activity Performed NMR throughout all mobility for supine<>sit via log-rolling and forced use of RUE/LE with cues for full trunk roll. Pt needed max verbal and visual target cues to correct L/posterior leaning in sitting initially.  Performed squat-pivot transfers bed<>WC and WC<>toilet with Mod/Max A with assist for set-up and anterior translation to reduce posteriolateral leaning.   Pt propelled WCx60' with Min A for steering, needing max cues to sustain attention to full stroke length R>L to compensate.   NMR Performed static and dynamic standing tasks in // bars with mirror for visual feedback. Manual facilitation provided for trunk derotation, RLE hip and knee ext, and weight shifting. 3 sets of 2-33min standing for:  - lateral weight shifts - R/L step-taps to 2" step - static stance with LLE on step  Gait training in // bars and L hand rail with Max A needed 1x8' and 1x15' with step-by-step cues for sequence and technique. Pt tends to keep narrow BOS, decreased L step length, flexed trunk, and decreased R hip/knee ext in stance. Therapist supporting RLE in stance, upright trunk, and RLE placement.   Pt wanting to return back to bed end of tx despite encouragement to stay OOB. Bed alarm on and all needs in reach; family present.       Therapy Documentation Precautions:  Precautions Precautions: Fall Precaution Comments: Pushes to R side.; midline disorientation Restrictions Weight Bearing Restrictions: No  Pain: Pain at R dorsum foot, unrated, modified tx   See FIM for current functional status  Therapy/Group: Individual Therapy Kennieth Rad, PT, DPT  10/06/2013, 11:59 AM

## 2013-10-07 ENCOUNTER — Encounter (HOSPITAL_COMMUNITY): Payer: Medicare Other | Admitting: Occupational Therapy

## 2013-10-07 ENCOUNTER — Inpatient Hospital Stay (HOSPITAL_COMMUNITY): Payer: Medicare Other

## 2013-10-07 ENCOUNTER — Inpatient Hospital Stay (HOSPITAL_COMMUNITY): Payer: Medicare Other | Admitting: Speech Pathology

## 2013-10-07 DIAGNOSIS — F3341 Major depressive disorder, recurrent, in partial remission: Secondary | ICD-10-CM | POA: Diagnosis present

## 2013-10-07 DIAGNOSIS — I10 Essential (primary) hypertension: Secondary | ICD-10-CM | POA: Diagnosis present

## 2013-10-07 LAB — BASIC METABOLIC PANEL
Anion gap: 11 (ref 5–15)
BUN: 39 mg/dL — AB (ref 6–23)
CO2: 21 meq/L (ref 19–32)
Calcium: 9.1 mg/dL (ref 8.4–10.5)
Chloride: 104 mEq/L (ref 96–112)
Creatinine, Ser: 1.24 mg/dL — ABNORMAL HIGH (ref 0.50–1.10)
GFR calc Af Amer: 48 mL/min — ABNORMAL LOW (ref >90)
GFR calc non Af Amer: 41 mL/min — ABNORMAL LOW (ref >90)
Glucose, Bld: 130 mg/dL — ABNORMAL HIGH (ref 70–99)
Potassium: 4.8 mEq/L (ref 3.7–5.3)
SODIUM: 136 meq/L — AB (ref 137–147)

## 2013-10-07 MED ORDER — FAMOTIDINE 20 MG PO TABS
20.0000 mg | ORAL_TABLET | Freq: Every day | ORAL | Status: DC
  Administered 2013-10-07 – 2013-10-20 (×14): 20 mg via ORAL
  Filled 2013-10-07 (×15): qty 1

## 2013-10-07 MED ORDER — SENNOSIDES-DOCUSATE SODIUM 8.6-50 MG PO TABS
1.0000 | ORAL_TABLET | ORAL | Status: DC | PRN
  Administered 2013-10-08: 1 via ORAL
  Filled 2013-10-07 (×2): qty 1

## 2013-10-07 NOTE — Progress Notes (Signed)
Social Work Patient ID: Candace Monroe, female   DOB: 25-Oct-1940, 73 y.o.   MRN: [id number] Met with pt and husband to discuss team conference goals-supervision/min level and discharge 10/21.  They are pleased with the foals although pt feels it is too long. Husband reports it will go fast and they need the education for her care.  Will continue to work on discharge needs, pt will need a ramp at home.  Husband aware of this.

## 2013-10-07 NOTE — Progress Notes (Signed)
Occupational Therapy Session Note  Patient Details  Name: Candace Monroe MRN: [id number] Date of Birth: Mar 17, 1940  Today's Date: 10/07/2013 OT Individual Time: 0800-0900 OT Individual Time Calculation (min): 60 min    Short Term Goals: Week 1:  OT Short Term Goal 1 (Week 1): Pt will perform toilet transfer with Mod A in order to increase I in functional transfers.  OT Short Term Goal 2 (Week 1): Pt will perform bathing with Mod A in order to increase I in self care. OT Short Term Goal 3 (Week 1): Pt will perform toileting with Mod A in order to increase I in functional transfers.  OT Short Term Goal 4 (Week 1): Pt will performed seated laundry task , folding clothes, with Mod A and mod cues for seated balance.   Skilled Therapeutic Interventions/Progress Updates:    1:1 self care retraining. Pt declined to bathe today reporting she showered yesterday.  Focused treatment on transfer training (stand pivot and squat pivot) and standing balance. Preformed stand pivot to toilet with grab bar with min A with max VC and tactile cues for sequence and attention to right LE. Pt preformed dressing at the sink sit to stand with cushion removed from chair to lower the chair height for increase success with dressing.  Pt with notable improvement in sitting balance on toilet, bed, Edge of w/c and EOM with supervision and required decr amt of cuing to maintain midline posture. Pt able to propel with bilateral feet int he hallway with max VC and tactile cues for attention to right leg to move forward. In the gym continued to focus on squat pivot transfers with maintaining a forward weight shift and accuate placement of feet prior to transfer. Pt able to transfer with mod cuing with min A 5 times by end of session with good carry over within session. Also continued to work on standing balance with control of right knee in standing and controled descents to sitting surface. Left in the room with family present.    Therapy Documentation Precautions:  Precautions Precautions: Fall Precaution Comments: Pushes to R side.; midline disorientation Restrictions Weight Bearing Restrictions: No Pain:  Pt reports soreness on top of right foot  See FIM for current functional status  Therapy/Group: Individual Therapy  Willeen Cass [facility] 10/07/2013, 9:10 AM

## 2013-10-07 NOTE — Progress Notes (Signed)
Speech Language Pathology Daily Session Note  Patient Details  Name: Candace Monroe MRN: [id number] Date of Birth: 09-26-1940  Today's Date: 10/07/2013 SLP Individual Time: 0950-1035 SLP Individual Time Calculation (min): 45 min  Short Term Goals: Week 1: SLP Short Term Goal 1 (Week 1): Pt will label 2 physical and 1 cognitive change post CVA with Min question cues  SLP Short Term Goal 2 (Week 1): Pt will request help as needed with Mod question cues SLP Short Term Goal 3 (Week 1): Pt will demonstrate complex problem solving with Min assist to self-monitor and correct errors SLP Short Term Goal 4 (Week 1): Pt will utilize external aids to assist with recal of new information with Min cues  Skilled Therapeutic Interventions:  Pt was seen for skilled speech therapy targeting cognitive goals.  SLP facilitated the session with a basic problem solving task targeting numerical reasoning and error awareness.  Pt was able to complete task for 100% accuracy with overall supervision assist.   SLP increased challenge with a semi-complex problem solving task targeting planning, thought organization, mental flexibility, and abstract reasoning.  Pt benefited from increased processing time and min assist cues to identify multiple solutions to targeted problems.  SLP initiated basic education with pt's husband upon return to pt's room including pt's current goals and progress in therapy.  Pt's husband reports that pt is "almost" at her cognitive baseline.  SLP recommended that pt have assistance for medication and financial management at discharge, to which both pt and pt's husband were agreeable.  Continue per current plan of care.    FIM:  Comprehension Comprehension Mode: Auditory Comprehension: 5-Follows basic conversation/direction: With extra time/assistive device Expression Expression Mode: Verbal Expression: 5-Expresses basic needs/ideas: With extra time/assistive device Social Interaction Social  Interaction: 4-Interacts appropriately 75 - 89% of the time - Needs redirection for appropriate language or to initiate interaction. Problem Solving Problem Solving: 4-Solves basic 75 - 89% of the time/requires cueing 10 - 24% of the time Memory Memory: 4-Recognizes or recalls 75 - 89% of the time/requires cueing 10 - 24% of the time  Pain Pain Assessment Pain Assessment: No/denies pain  Therapy/Group: Individual Therapy  Windell Moulding, M.A. CCC-SLP  Kirin Brandenburger, Selinda Orion 10/07/2013, 11:54 AM

## 2013-10-07 NOTE — Progress Notes (Signed)
[place] PHYSICAL MEDICINE & REHABILITATION     PROGRESS NOTE 73 year old female with history of HTN, diabetes (off medications), right breast mass, ongoing tobacco use who was admitted to [facility] on 09/27/13 with right sided weakness. Blood pressure 240/100 at admission. CT head without acute changes. MRI brain done revealing acute lacunar infarct left frontal lobe peri-Rolandic white matter, moderate to severe underlying chronic small vessel disease. 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 Cardiac echo with EF 50-60% with no wall abnormality, trace MVR. She was evaluated by Dr. Irish Elders who recommended full strength ASA for thrombotic stroke due to uncontrolled HTN, tobacco use and diabetes    Subjective/Complaints: Denies breathing issues or pain, no bowel or bladder issues Nausea and one episode of emesis yesterday A  review of systems has been performed and if not noted above is otherwise negative.   Objective: Vital Signs: Blood pressure 154/69, pulse 66, temperature 97.9 F (36.6 C), temperature source Oral, resp. rate 18, height 5' (1.524 m), weight 53.9 kg (118 lb 13.3 oz), SpO2 96.00%. No results found.  Recent Labs  10/05/13 0602  WBC 9.5  HGB 14.4  HCT 42.0  PLT 205    Recent Labs  10/05/13 0602 10/07/13 0632  NA 139 136*  K 4.7 4.8  CL 107 104  GLUCOSE 135* 130*  BUN 40* 39*  CREATININE 1.31* 1.24*  CALCIUM 9.2 9.1   CBG (last 3)  No results found for this basename: GLUCAP,  in the last 72 hours  Wt Readings from Last 3 Encounters:  10/02/13 53.9 kg (118 lb 13.3 oz)  10/02/13 57.607 kg (127 lb)    Physical Exam:  Nursing note and vitals reviewed.  Constitutional: She is oriented to person, place, and time. She appears well-developed and well-nourished.   HENT: Oral mucosa pink and moist  Head: Normocephalic and atraumatic.  Eyes: Conjunctivae are normal. Pupils are equal, round, and reactive to light.   Neck: Normal range of motion. Neck supple.  Cardiovascular: Normal rate and regular rhythm.  Respiratory: Effort normal. . She has no rales. She exhibits no tenderness.  GI: Soft. Bowel sounds are normal. She exhibits no distension. There is no tenderness.  Musculoskeletal: She exhibits no edema and no tenderness.  Neurological: She is alert and oriented to person, place, and time. Sensory intake Speech clear. Able to follow two step commands without difficulty. Right hemiparesis--deltoid 3+/5, biceps/triceps 4/5, intrinsics 4/5, HF 3+/4, KE 3-/5, PF 3-/5, DF trace. Skin: Skin is warm and dry. On ext, no evidence of stasis dermatitis or heel breakdown Psychiatric: She has a normal mood and affect. Her behavior is normal. Judgment and thought content normal   Assessment/Plan: 1. Functional deficits secondary to left frontal infarct which require 3+ hours per day of interdisciplinary therapy in a comprehensive inpatient rehab setting. Physiatrist is providing close team supervision and 24 hour management of active medical problems listed below. Physiatrist and rehab team continue to assess barriers to discharge/monitor patient progress toward functional and medical goals. FIM: FIM - Bathing Bathing Steps Patient Completed: Chest;Right Arm;Left Arm;Abdomen;Front perineal area;Right upper leg;Left upper leg Bathing: 3: Mod-Patient completes 5-7 30f 10 parts or 50-74%  FIM - Upper Body Dressing/Undressing Upper body dressing/undressing steps patient completed: Thread/unthread right sleeve of pullover shirt/dresss;Thread/unthread left sleeve of pullover shirt/dress;Put head through opening of pull over shirt/dress;Pull shirt over trunk Upper body dressing/undressing: 5: Supervision: Safety issues/verbal cues FIM - Lower Body Dressing/Undressing Lower body dressing/undressing  steps patient completed: Thread/unthread left pants leg;Don/Doff left sock;Don/Doff left shoe Lower body  dressing/undressing: 2: Max-Patient completed 25-49% of tasks  FIM - Toileting Toileting steps completed by patient: Performs perineal hygiene Toileting Assistive Devices: Grab bar or rail for support Toileting: 2: Max-Patient completed 1 of 3 steps  FIM - Radio producer Devices: Grab bars Toilet Transfers: 3-To toilet/BSC: Mod A (lift or lower assist);2-From toilet/BSC: Max A (lift and lower assist)  FIM - Engineer, site Assistive Devices: Arm rests Bed/Chair Transfer: 2: Supine > Sit: Max A (lifting assist/Pt. 25-49%);3: Sit > Supine: Mod A (lifting assist/Pt. 50-74%/lift 2 legs);3: Bed > Chair or W/C: Mod A (lift or lower assist);2: Chair or W/C > Bed: Max A (lift and lower assist)  FIM - Locomotion: Wheelchair Distance: 60 Locomotion: Wheelchair: 2: Travels 50 - 149 ft with minimal assistance (Pt.>75%) FIM - Locomotion: Ambulation Locomotion: Ambulation Assistive Devices: Parallel bars Ambulation/Gait Assistance: 2: Max assist Locomotion: Ambulation: 1: Travels less than 50 ft with maximal assistance (Pt: 25 - 49%)  Comprehension Comprehension Mode: Auditory Comprehension: 5-Follows basic conversation/direction: With extra time/assistive device  Expression Expression Mode: Verbal Expression: 5-Expresses basic needs/ideas: With extra time/assistive device  Social Interaction Social Interaction Mode: Asleep Social Interaction: 4-Interacts appropriately 75 - 89% of the time - Needs redirection for appropriate language or to initiate interaction.  Problem Solving Problem Solving Mode: Asleep Problem Solving: 4-Solves basic 75 - 89% of the time/requires cueing 10 - 24% of the time  Memory Memory Mode: Asleep Memory: 4-Recognizes or recalls 75 - 89% of the time/requires cueing 10 - 24% of the time  Medical Problem List and Plan:  1. 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 DVT Prophylaxis/Anticoagulation: Pharmaceutical: Lovenox  3. Pain Management: N/A  4. Depression/Mood: continue lexapro  5. Neuropsych: This patient is capable of making decisions on her own behalf.  6. Skin/Wound Care: Routine pressure relief measures , 7. Fluids/Electrolytes/Nutrition: Monitor I/O. Offer supplements if intake poor. Follow up labs on admit  8. HTN: Blood pressures trending upward and medications resumed today. Permissive hypertension to allow perfusion. Will monitor every 8 hours and titrate medication to home dose.  9. Diabetes: No medications for past 1-2 years--reports she lost a lot of weight and last hgb A1c around 5. Will check hgb A1c. Fasting BS 108-135 range.  -dietary ed  10. Dyslipidemia: Continue lipitor.  11.  Reflux symptoms took pepcid at home   LOS (Days) 5 A FACE TO FACE EVALUATION WAS PERFORMED  Alysia Penna E 10/07/2013 7:53 AM

## 2013-10-07 NOTE — Progress Notes (Signed)
Physical Therapy Session Note  Patient Details  Name: Candace Monroe MRN: [id number] Date of Birth: [birth year]  Today's Date: 10/07/2013 PT Individual Time: 1448-1530, 1448-1530 PT Individual Time Calculation (min): 42 min , 42 min  Short Term Goals: Week 1:  PT Short Term Goal 1 (Week 1): Pt will consistently perform supine<>sit with min  A with HOB flat using rail. PT Short Term Goal 2 (Week 1): Pt will perform supine<>sit with min A with rails and HOB flat. PT Short Term Goal 3 (Week 1): Pt will consistently perform bed<>w/c transfer (both directions) with mod A. PT Short Term Goal 4 (Week 1): Pt will perform w/c mobility x50' with mod A and 50% cueing. PT Short Term Goal 5 (Week 1): Pt will perform gait x30' in controlled environment with max A of single therapist.  Skilled Therapeutic Interventions/Progress Updates:   Tx 1: W/c> mat stand pivot to R with max assist (pt stated her L foot slipped) uncontrolled descent; mat> w/c squat pivot to L with min assist. neuromuscular re-education as below for RLE stance stability and R swing phase components, midline orientation, wt shifting and RLe stance stability in standing, w/c propulsion. Up/down 3 (5"high) steps with 2 rails, min/mod assist; ascending forwards, descending backwards.  Tx 2:  W/c propulsion as above, x 60' with min assist for steering.  W/c>< NuStep with min assist, bil hands on bars.  neuromuscular re-education for RLE motor control and midline orientation on Kinetron at 25cm/sec in sitting witout use of UEs, and in standing with max assist for R stance stability with bil UE support.  W/c> bed for rest before getting up to eat supper, squat pivot to L with mod assist. Sit> supine with supervision, max cues for RLE. Bed alarm set and family present.     Therapy Documentation Precautions:  Precautions Precautions: Fall Precaution Comments: Pushes to R side.; midline disorientation Restrictions Weight Bearing  Restrictions: No Pain: Pain Assessment Pain Assessment: No/denies pain   Other Treatments: Treatments Therapeutic Activity: reaching in all directions in supported sitting; reaching to floor in sitting; w/c propulsion using bil UEs, Kinetron in sitting and standing Neuromuscular Facilitation: Right;Upper Extremity;Lower Extremity;Forced use;Activity to increase sustained activation;Activity to increase motor control;Activity to increase grading;Activity to increase timing and sequencing;Activity to increase lateral weight shifting;Activity to increase anterior-posterior weight shifting  See FIM for current functional status  Therapy/Group: Individual Therapy  Tayveon Lombardo 10/07/2013, 3:42 PM

## 2013-10-07 NOTE — Patient Care Conference (Signed)
Inpatient RehabilitationTeam Conference and Plan of Care Update Date: 10/07/2013   Time: 10;30 AM    Patient Name: Candace Monroe      Medical Record Number: [id number]  Date of Birth: 07/25/1940 Sex: Female         Room/Bed: 4W24C/4W24C-01 Payor Info: Payor: [place] / Plan: [employer] MEDICARE COMPLETE / Product Type: *No Product type* /    Admitting Diagnosis: l cva  Admit Date/Time:  10/02/2013  4:22 PM Admission Comments: No comment available   Primary Diagnosis:  CVA (cerebral infarction) Principal Problem: CVA (cerebral infarction)  Patient Active Problem List   Diagnosis Date Noted  . HTN (hypertension) 10/07/2013  . Depression 10/07/2013  . Spastic hemiparesis affecting dominant side 10/06/2013  . CVA (cerebral infarction) 10/02/2013    Expected Discharge Date: Expected Discharge Date: 10/21/13  Team Members Present: Physician leading conference: Dr. Alysia Penna Social Worker Present: Ovidio Kin, LCSW Nurse Present: Heather Roberts, RN PT Present: Georjean Mode, PT;Blair Hobble, PT OT Present: Simonne Come, OT SLP Present: Windell Moulding, SLP PPS Coordinator present : Daiva Nakayama, RN, CRRN     Current Status/Progress Goal Weekly Team Focus  Medical   no new medical complications, friable skin, chronic bladder incont  improve sitting balance, reduce pushing  toileting program   Bowel/Bladder   Pt continent of bowel. Pt wears pull up prior to admission due to urgancy. Pt has incont. episdoes at times with pull up used. Mostly HS  manage b/b with minimal assist  start timed toileting thoughout day q2-3 hrs   Swallow/Nutrition/ Hydration     WFL        ADL's   mod A bathing, min with UB dressing, max LB dressing, toilet transfers mod A with grab bars, mod - max standing balance  min standing balance, min bathing, set up UB dressing, min LB dressing, min toileting and toilet transfers, min tub bench transfers  ADL retraining, RUE neuro re-ed, postural  control, balance, cognition, family education   Mobility   Mod/Max A transfers, Mod A bed mobility, WC x 50' with Min A , Max A gait x~10', total A stairs  S transfers and bed mobility, Min A gait x25' and 4 steps, S WC x50'  sitting and standing balance, neuro re-ed, transfer training, pre-gait training   Communication     [facility]        Safety/Cognition/ Behavioral Observations  min assist   supervision   awareness, memory, semi-complex problem solving    Pain   pt complains of pain to right foot (sore); muscle rub to back as scheduled; tylenol prn q4 hrs  manage pain 3 or less  assess pain q shift; contiune use of muscle rub and prn medications   Skin   skin tear to right forarm and elbow-foam dressing and tegaderm inplace. Scattered bruising noted. Pink blanchable sacrum  remain free of skin breakdown and infection with minimal assist  assess skin q shift, turn freqently      *See Care Plan and progress notes for long and short-term goals.  Barriers to Discharge: max A for steps    Possible Resolutions to Barriers:  ? ramp vs additional PT    Discharge Planning/Teaching Needs:  Home with husband, daughter to assist she is currently staying here with pt.       Team Discussion:  Timed tolieting-memory issues. Pushes to the right-urgency with bladder at home wore briefs. Skin is fragile-monitoring while here.  Husband and daughter here daily-aware of  need for ramp and 24 hr care  Revisions to Treatment Plan:  None   Continued Need for Acute Rehabilitation Level of Care: The patient requires daily medical management by a physician with specialized training in physical medicine and rehabilitation for the following conditions: Daily direction of a multidisciplinary physical rehabilitation program to ensure safe treatment while eliciting the highest outcome that is of practical value to the patient.: Yes Daily medical management of patient stability for increased activity during  participation in an intensive rehabilitation regime.: Yes Daily analysis of laboratory values and/or radiology reports with any subsequent need for medication adjustment of medical intervention for : Neurological problems  Harmonie Verrastro, Gardiner Rhyme 10/07/2013, 2:49 PM

## 2013-10-08 ENCOUNTER — Inpatient Hospital Stay (HOSPITAL_COMMUNITY): Payer: Medicare Other | Admitting: Speech Pathology

## 2013-10-08 ENCOUNTER — Inpatient Hospital Stay (HOSPITAL_COMMUNITY): Payer: Medicare Other

## 2013-10-08 ENCOUNTER — Inpatient Hospital Stay (HOSPITAL_COMMUNITY): Payer: Medicare Other | Admitting: Occupational Therapy

## 2013-10-08 DIAGNOSIS — I10 Essential (primary) hypertension: Secondary | ICD-10-CM

## 2013-10-08 DIAGNOSIS — G8110 Spastic hemiplegia affecting unspecified side: Secondary | ICD-10-CM

## 2013-10-08 DIAGNOSIS — I6330 Cerebral infarction due to thrombosis of unspecified cerebral artery: Secondary | ICD-10-CM

## 2013-10-08 NOTE — Progress Notes (Addendum)
Speech Language Pathology Daily Session Note  Patient Details  Name: Candace Monroe MRN: [id number] Date of Birth: 1940-07-27  Today's Date: 10/08/2013 SLP Individual Time: [id number] SLP Individual Time Calculation (min): 49 min  Short Term Goals: Week 1: SLP Short Term Goal 1 (Week 1): Pt will label 2 physical and 1 cognitive change post CVA with Min question cues  SLP Short Term Goal 2 (Week 1): Pt will request help as needed with Mod question cues SLP Short Term Goal 3 (Week 1): Pt will demonstrate complex problem solving with Min assist to self-monitor and correct errors SLP Short Term Goal 4 (Week 1): Pt will utilize external aids to assist with recal of new information with Min cues  Skilled Therapeutic Interventions:  Pt was seen for skilled speech therapy targeting cognitive goals and family education.  Pt's husband and daughter were present for today's therapy session; therefore, SLP initiated skilled education related to current goals and progress in therapy.  Pt and pt's family endorse that pt is near baseline cognitively; however, pt continues to present with decreased awareness of both physical and cognitive deficits and frequently became defensive when she was asked how her physical deficits will impact her functional independence in her home environment.  Pt required overall mod assist for emergent awareness during functional conversations related to discharge.  SLP recommended 24/7 supervision upon discharge, as well as assistance for medication and financial management.  Pt was initially reluctant, but became agreeable to recommendation with encouragement from family.  SLP also initiated skilled education related to compensatory strategies for memory, executive function, and attention to maximize pt's functional independence and reduce caregiver burden in the home environment.  Handout was provided to family to facilitate carryover between therapy sessions.  More family training  recommended as pt progresses in therapy and nears discharge.   SLP returned pt to bed, upon which pt with x2 instances of vomiting.  Per pt's husband, pt with frequent episodes of vomiting at baseline due to reflux.  RN made aware.    FIM:  Comprehension Comprehension Mode: Auditory Comprehension: 5-Follows basic conversation/direction: With extra time/assistive device Expression Expression Mode: Verbal Expression: 5-Expresses basic needs/ideas: With extra time/assistive device Social Interaction Social Interaction: 4-Interacts appropriately 75 - 89% of the time - Needs redirection for appropriate language or to initiate interaction. Problem Solving Problem Solving: 4-Solves basic 75 - 89% of the time/requires cueing 10 - 24% of the time Memory Memory: 4-Recognizes or recalls 75 - 89% of the time/requires cueing 10 - 24% of the time  Pain Pain Assessment Pain Assessment: No/denies pain  Therapy/Group: Individual Therapy  Windell Moulding, M.A. CCC-SLP  Aliha Diedrich, Selinda Orion 10/08/2013, 12:39 PM

## 2013-10-08 NOTE — Progress Notes (Signed)
Physical Therapy Session Note  Patient Details  Name: Candace Monroe MRN: [id number] Date of Birth: [birth year]  Today's Date: 10/08/2013 PT Individual Time: 1305-1420 PT Individual Time Calculation (min): 75 min   Short Term Goals: Week 1:  PT Short Term Goal 1 (Week 1): Pt will consistently perform supine<>sit with min  A with HOB flat using rail. PT Short Term Goal 2 (Week 1): Pt will perform supine<>sit with min A with rails and HOB flat. PT Short Term Goal 3 (Week 1): Pt will consistently perform bed<>w/c transfer (both directions) with mod A. PT Short Term Goal 4 (Week 1): Pt will perform w/c mobility x50' with mod A and 50% cueing. PT Short Term Goal 5 (Week 1): Pt will perform gait x30' in controlled environment with max A of single therapist.  Skilled Therapeutic Interventions/Progress Updates:  neuromuscular re-education as below for RLE stance stability and swing phase components, R trunk shortening/lengthening, trunk extension.  Squat pivot transfer w/c> NuStep with mod assist to R.  NuStep at level 4 rated 12 on BORG using 4 extremities for RUE , RLE and R trunk neuro re-ed..     Therapy Documentation Precautions:  Precautions Precautions: Fall Precaution Comments: Pushes to R side.; midline disorientation Restrictions Weight Bearing Restrictions: No   Pain: Pain Assessment Pain Assessment: No/denies pain      Other Treatments: Treatments Therapeutic Activity: standing in standing frame performing terminal hip extension with midline orientation and trunk righting with wt shifting, using bil hands or bil feet for fine motor task of putting lids on Rx bottles, propelling w/c using bil hands, static sitting with bias toward L for R trunk shortening, gait with grocery cart and EVA Neuromuscular Facilitation: Right;Upper Extremity;Lower Extremity;Activity to increase timing and sequencing;Activity to increase sustained activation;Activity to increase lateral weight  shifting;Activity to increase anterior-posterior weight shifting Weight Bearing Technique Weight Bearing Technique: Yes RUE Weight Bearing Technique: Forearm standing  See FIM for current functional status  Therapy/Group: Individual Therapy  Candace Monroe 10/08/2013, 2:30 PM

## 2013-10-08 NOTE — Progress Notes (Signed)
Occupational Therapy Session Note  Patient Details  Name: Candace Monroe MRN: [id number] Date of Birth: 05-May-1940  Today's Date: 10/08/2013 OT Individual Time: 0800-0900 OT Individual Time Calculation (min): 60 min    Short Term Goals: Week 1:  OT Short Term Goal 1 (Week 1): Pt will perform toilet transfer with Mod A in order to increase I in functional transfers.  OT Short Term Goal 2 (Week 1): Pt will perform bathing with Mod A in order to increase I in self care. OT Short Term Goal 3 (Week 1): Pt will perform toileting with Mod A in order to increase I in functional transfers.  OT Short Term Goal 4 (Week 1): Pt will performed seated laundry task , folding clothes, with Mod A and mod cues for seated balance.   Skilled Therapeutic Interventions/Progress Updates:      Pt seen for BADL retraining of toileting, bathing at shower level, and dressing with a focus on awareness of RLE positioning, postural support, balance. Pt was able to sit to EOB with only S and was able to complete stand pivot to w/c with mod A. Once in the bathroom, she was able to use the grab bars well to complete both toilet and shower stall transfers with min A as she now has improved use of RUE. On tub bench, needed steadying A several times as she would lean too far to the right when she was distracted with her bathing.  Pt worked on dressing from w/c level at sink with mod input of verbal and tactile cues for R foot placement and with correction of her posture when she was leaning to the R. Much improved LE AROM to be able to lift legs for donning clothing and tying shoes.   Pt also worked on standing balance exercises at the sink: heel raises, mini squats, and stepping R foot out and in (with mod A to bring R foot back into alignment). Pt resting in w/c at end of session with family in the room.  Therapy Documentation Precautions:  Precautions Precautions: Fall Precaution Comments: Pushes to R side.; midline  disorientation Restrictions Weight Bearing Restrictions: No   Pain: Pain Assessment Pain Assessment: No/denies pain Pain Score: 4  Pain Location: Foot Pain Orientation: Right Pain Descriptors / Indicators: Sore Pain Onset: With Activity ADL:  See FIM for current functional status  Therapy/Group: Individual Therapy  Richelle Glick 10/08/2013, 11:31 AM

## 2013-10-08 NOTE — Progress Notes (Signed)
[place] PHYSICAL MEDICINE & REHABILITATION     PROGRESS NOTE 73 year old female with history of HTN, diabetes (off medications), right breast mass, ongoing tobacco use who was admitted to [facility] on 09/27/13 with right sided weakness. Blood pressure 240/100 at admission. CT head without acute changes. MRI brain done revealing acute lacunar infarct left frontal lobe peri-Rolandic white matter, moderate to severe underlying chronic small vessel disease. 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 Cardiac echo with EF 50-60% with no wall abnormality, trace MVR. She was evaluated by Dr. Irish Elders who recommended full strength ASA for thrombotic stroke due to uncontrolled HTN, tobacco use and diabetes    Subjective/Complaints: Denies breathing issues or pain, no bowel or bladder issues Nausea and one episode of emesis yesterday A  review of systems has been performed and if not noted above is otherwise negative.   Objective: Vital Signs: Blood pressure 170/70, pulse 77, temperature 98 F (36.7 C), temperature source Oral, resp. rate 18, height 5' (1.524 m), weight 55.838 kg (123 lb 1.6 oz), SpO2 97.00%. No results found. No results found for this basename: WBC, HGB, HCT, PLT,  in the last 72 hours  Recent Labs  10/07/13 0632  NA 136*  K 4.8  CL 104  GLUCOSE 130*  BUN 39*  CREATININE 1.24*  CALCIUM 9.1   CBG (last 3)  No results found for this basename: GLUCAP,  in the last 72 hours  Wt Readings from Last 3 Encounters:  10/07/13 55.838 kg (123 lb 1.6 oz)  10/02/13 57.607 kg (127 lb)    Physical Exam:  Nursing note and vitals reviewed.  Constitutional: She is oriented to person, place, and time. She appears well-developed and well-nourished.   HENT: Oral mucosa pink and moist  Head: Normocephalic and atraumatic.  Eyes: Conjunctivae are normal. Pupils are equal, round, and reactive to light.  Neck: Normal range of motion. Neck  supple.  Cardiovascular: Normal rate and regular rhythm.  Respiratory: Effort normal. . She has no rales. She exhibits no tenderness.  GI: Soft. Bowel sounds are normal. She exhibits no distension. There is no tenderness.  Musculoskeletal: She exhibits no edema and no tenderness.  Neurological: She is alert and oriented to person, place, and time. Sensory intake Speech clear. Able to follow two step commands without difficulty. Right hemiparesis--deltoid 3+/5, biceps/triceps 4/5, intrinsics 4/5, HF 3+/4, KE 3-/5, PF 3-/5, DF trace. Skin: Skin is warm and dry. On ext, no evidence of stasis dermatitis or heel breakdown Psychiatric: She has a normal mood and affect. Her behavior is normal. Judgment and thought content normal   Assessment/Plan: 1. Functional deficits secondary to left frontal infarct which require 3+ hours per day of interdisciplinary therapy in a comprehensive inpatient rehab setting. Physiatrist is providing close team supervision and 24 hour management of active medical problems listed below. Physiatrist and rehab team continue to assess barriers to discharge/monitor patient progress toward functional and medical goals. Discussed D/C date FIM: FIM - Bathing Bathing Steps Patient Completed:  (didnt perform bathing today) Bathing: 3: Mod-Patient completes 5-7 9f 10 parts or 50-74%  FIM - Upper Body Dressing/Undressing Upper body dressing/undressing steps patient completed: Thread/unthread right sleeve of pullover shirt/dresss;Thread/unthread left sleeve of pullover shirt/dress;Put head through opening of pull over shirt/dress;Pull shirt over trunk Upper body dressing/undressing: 5: Set-up assist to: Obtain clothing/put away FIM - Lower Body Dressing/Undressing Lower body dressing/undressing steps patient completed: Thread/unthread right underwear leg;Thread/unthread left underwear leg;Thread/unthread right pants  leg;Thread/unthread left pants leg;Pull pants up/down;Don/Doff left  sock;Don/Doff right sock;Don/Doff right shoe;Don/Doff left shoe;Fasten/unfasten right shoe Lower body dressing/undressing: 4: Min-Patient completed 75 plus % of tasks  FIM - Toileting Toileting steps completed by patient: Adjust clothing prior to toileting;Performs perineal hygiene;Adjust clothing after toileting (with assist from staff with pulling up pants) Toileting Assistive Devices: Grab bar or rail for support Toileting: 3: Mod-Patient completed 2 of 3 steps  FIM - Radio producer Devices: Grab bars Toilet Transfers: 4-To toilet/BSC: Min A (steadying Pt. > 75%);4-From toilet/BSC: Min A (steadying Pt. > 75%)  FIM - Bed/Chair Transfer Bed/Chair Transfer Assistive Devices: Arm rests Bed/Chair Transfer: 5: Sit > Supine: Supervision (verbal cues/safety issues);2: Chair or W/C > Bed: Max A (lift and lower assist);3: Chair or W/C > Bed: Mod A (lift or lower assist)  FIM - Locomotion: Wheelchair Distance: 60 Locomotion: Wheelchair: 2: Travels 50 - 149 ft with minimal assistance (Pt.>75%) FIM - Locomotion: Ambulation Locomotion: Ambulation Assistive Devices: Other (comment) (hallway railing) Ambulation/Gait Assistance: 2: Max assist Locomotion: Ambulation: 1: Travels less than 50 ft with maximal assistance (Pt: 25 - 49%)  Comprehension Comprehension Mode: Auditory Comprehension: 5-Understands complex 90% of the time/Cues < 10% of the time  Expression Expression Mode: Verbal Expression: 5-Expresses basic needs/ideas: With no assist  Social Interaction Social Interaction Mode: Asleep Social Interaction: 5-Interacts appropriately 90% of the time - Needs monitoring or encouragement for participation or interaction.  Problem Solving Problem Solving Mode: Asleep Problem Solving: 5-Solves basic 90% of the time/requires cueing < 10% of the time  Memory Memory Mode: Asleep Memory: 4-Recognizes or recalls 75 - 89% of the time/requires cueing 10 - 24% of the  time  Medical Problem List and Plan:  1. 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 DVT Prophylaxis/Anticoagulation: Pharmaceutical: Lovenox  3. Pain Management: N/A  4. Depression/Mood: continue lexapro  5. Neuropsych: This patient is capable of making decisions on her own behalf.  6. Skin/Wound Care: Routine pressure relief measures , 7. Fluids/Electrolytes/Nutrition: Monitor I/O. Offer supplements if intake poor. Follow up labs on admit  8. HTN: Blood pressures trending upward and medications resumed today. Permissive hypertension to allow perfusion. Will monitor every 8 hours and titrate medication to home dose.  9. Diabetes: No medications for past 1-2 years--reports she lost a lot of weight and last hgb A1c around 5. Will check hgb A1c. Fasting BS 108-135 range.  -dietary ed  10. Dyslipidemia: Continue lipitor.  11.  Reflux symptoms took pepcid at home   LOS (Days) [place] E 10/08/2013 7:52 AM

## 2013-10-09 ENCOUNTER — Inpatient Hospital Stay (HOSPITAL_COMMUNITY): Payer: Medicare Other | Admitting: Speech Pathology

## 2013-10-09 ENCOUNTER — Inpatient Hospital Stay (HOSPITAL_COMMUNITY): Payer: Medicare Other | Admitting: *Deleted

## 2013-10-09 ENCOUNTER — Inpatient Hospital Stay (HOSPITAL_COMMUNITY): Payer: Medicare Other | Admitting: Occupational Therapy

## 2013-10-09 NOTE — Progress Notes (Signed)
Suppository offered to patient numerous times last night. She refused. Daughter also encouraged patient, but was unsuccessful. Patient states, "I will have the suppository tomorrow after my therapy." Aqeel Norgaard A. Alexia Dinger, RN.

## 2013-10-09 NOTE — Progress Notes (Signed)
[facility] PHYSICAL MEDICINE & REHABILITATION     PROGRESS NOTE 73 year old female with history of HTN, diabetes (off medications), right breast mass, ongoing tobacco use who was admitted to [facility] on 09/27/13 with right sided weakness. Blood pressure 240/100 at admission. CT head without acute changes. MRI brain done revealing acute lacunar infarct left frontal lobe peri-Rolandic white matter, moderate to severe underlying chronic small vessel disease. 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 Cardiac echo with EF 50-60% with no wall abnormality, trace MVR. She was evaluated by Dr. Irish Elders who recommended full strength ASA for thrombotic stroke due to uncontrolled HTN, tobacco use and diabetes    Subjective/Complaints: Denies breathing issues or pain, no bowel or bladder issues Constipated No further nausea or vomiting  A  review of systems has been performed and if not noted above is otherwise negative.   Objective: Vital Signs: Blood pressure 156/76, pulse 72, temperature 98.4 F (36.9 C), temperature source Oral, resp. rate 18, height 5' (1.524 m), weight 55.838 kg (123 lb 1.6 oz), SpO2 92.00%. No results found. No results found for this basename: WBC, HGB, HCT, PLT,  in the last 72 hours  Recent Labs  10/07/13 0632  NA 136*  K 4.8  CL 104  GLUCOSE 130*  BUN 39*  CREATININE 1.24*  CALCIUM 9.1   CBG (last 3)  No results found for this basename: GLUCAP,  in the last 72 hours  Wt Readings from Last 3 Encounters:  10/07/13 55.838 kg (123 lb 1.6 oz)  10/02/13 57.607 kg (127 lb)    Physical Exam:  Nursing note and vitals reviewed.  Constitutional: She is oriented to person, place, and time. She appears well-developed and well-nourished.   HENT: Oral mucosa pink and moist  Head: Normocephalic and atraumatic.  Eyes: Conjunctivae are normal. Pupils are equal, round, and reactive to light.  Neck: Normal range of motion. Neck  supple.  Cardiovascular: Normal rate and regular rhythm.  Respiratory: Effort normal. . She has no rales. She exhibits no tenderness.  GI: Soft. Bowel sounds are normal. She exhibits no distension. There is no tenderness.  Musculoskeletal: She exhibits no edema and no tenderness.  Neurological: She is alert and oriented to person, place, and time. Sensory intake Speech clear. Able to follow two step commands without difficulty. Right hemiparesis--deltoid 3+/5, biceps/triceps 4/5, intrinsics 4/5, HF 3+/4, KE 3-/5, PF 3-/5, DF trace. Skin: Skin is warm and dry. On ext, no evidence of stasis dermatitis or heel breakdown Psychiatric: She has a normal mood and affect. Her behavior is normal. Judgment and thought content normal   Assessment/Plan: 1. Functional deficits secondary to left frontal infarct which require 3+ hours per day of interdisciplinary therapy in a comprehensive inpatient rehab setting. Physiatrist is providing close team supervision and 24 hour management of active medical problems listed below. Physiatrist and rehab team continue to assess barriers to discharge/monitor patient progress toward functional and medical goals.  FIM: FIM - Bathing Bathing Steps Patient Completed: Chest;Right Arm;Left Arm;Abdomen;Front perineal area;Buttocks;Right upper leg;Left upper leg;Right lower leg (including foot);Left lower leg (including foot) Bathing: 4: Steadying assist  FIM - Upper Body Dressing/Undressing Upper body dressing/undressing steps patient completed: Thread/unthread right sleeve of pullover shirt/dresss;Thread/unthread left sleeve of pullover shirt/dress;Put head through opening of pull over shirt/dress;Pull shirt over trunk Upper body dressing/undressing: 5: Set-up assist to: Obtain clothing/put away FIM - Lower Body Dressing/Undressing Lower body dressing/undressing steps patient completed: Thread/unthread right underwear leg;Thread/unthread left  underwear leg;Thread/unthread  right pants leg;Thread/unthread left pants leg;Pull pants up/down;Don/Doff left sock;Don/Doff right sock;Don/Doff right shoe;Don/Doff left shoe;Fasten/unfasten right shoe;Pull underwear up/down Lower body dressing/undressing: 4: Min-Patient completed 75 plus % of tasks  FIM - Toileting Toileting steps completed by patient: Performs perineal hygiene;Adjust clothing prior to toileting;Adjust clothing after toileting Toileting Assistive Devices: Grab bar or rail for support Toileting: 3: Mod-Patient completed 2 of 3 steps (mod A support for balance)  FIM - Radio producer Devices: Grab bars Toilet Transfers: 4-To toilet/BSC: Min A (steadying Pt. > 75%);4-From toilet/BSC: Min A (steadying Pt. > 75%)  FIM - Bed/Chair Transfer Bed/Chair Transfer Assistive Devices: Arm rests Bed/Chair Transfer: 3: Chair or W/C > Bed: Mod A (lift or lower assist);4: Chair or W/C > Bed: Min A (steadying Pt. > 75%)  FIM - Locomotion: Wheelchair Distance: 150 Locomotion: Wheelchair: 5: Travels 150 ft or more: maneuvers on rugs and over door sills with supervision, cueing or coaxing FIM - Locomotion: Ambulation Locomotion: Ambulation Assistive Devices: Nutritional therapist Ambulation/Gait Assistance: 2: Max assist Locomotion: Ambulation: 1: Travels less than 50 ft with maximal assistance (Pt: 25 - 49%)  Comprehension Comprehension Mode: Auditory Comprehension: 5-Follows basic conversation/direction: With extra time/assistive device  Expression Expression Mode: Verbal Expression: 5-Expresses basic needs/ideas: With extra time/assistive device  Social Interaction Social Interaction Mode: Asleep Social Interaction: 4-Interacts appropriately 75 - 89% of the time - Needs redirection for appropriate language or to initiate interaction.  Problem Solving Problem Solving Mode: Asleep Problem Solving: 4-Solves basic 75 - 89% of the time/requires cueing 10 - 24% of the time  Memory Memory Mode:  Asleep Memory: 4-Recognizes or recalls 75 - 89% of the time/requires cueing 10 - 24% of the time  Medical Problem List and Plan:  1. 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 DVT Prophylaxis/Anticoagulation: Pharmaceutical: Lovenox  3. Pain Management: N/A  4. Depression/Mood: continue lexapro  5. Neuropsych: This patient is capable of making decisions on her own behalf.  6. Skin/Wound Care: Routine pressure relief measures , 7. Fluids/Electrolytes/Nutrition: Monitor I/O. Offer supplements if intake poor. Follow up labs on admit  8. HTN: Blood pressures trending upward and medications resumed today. Permissive hypertension to allow perfusion. Will monitor every 8 hours and titrate medication to home dose.  9. Diabetes: No medications for past 1-2 years--reports she lost a lot of weight and last hgb A1c around 5. Will check hgb A1c. Fasting BS 108-135 range.  -dietary ed  10. Dyslipidemia: Continue lipitor.  11.  Reflux symptoms appear to be improving on Pepcid 12.  Constipated, pt will wait until after therapy for laxative today   LOS (Days) 7 A FACE TO FACE EVALUATION WAS PERFORMED  Bani Gianfrancesco E 10/09/2013 7:56 AM

## 2013-10-09 NOTE — Progress Notes (Signed)
Speech Language Pathology Daily Session Note  Patient Details  Name: Candace Monroe MRN: [id number] Date of Birth: 08-13-1940  Today's Date: 10/09/2013 SLP Individual Time: 1331-1416 SLP Individual Time Calculation (min): 45 min  Short Term Goals: Week 1: SLP Short Term Goal 1 (Week 1): Pt will label 2 physical and 1 cognitive change post CVA with Min question cues  SLP Short Term Goal 2 (Week 1): Pt will request help as needed with Mod question cues SLP Short Term Goal 3 (Week 1): Pt will demonstrate complex problem solving with Min assist to self-monitor and correct errors SLP Short Term Goal 4 (Week 1): Pt will utilize external aids to assist with recal of new information with Min cues  Skilled Therapeutic Interventions:  Pt was seen for skilled speech therapy targeting cognitive goals.  Pt was received from PT session, awake, alert, upright in wheelchair and pleasantly interactive.  SLP facilitated the session with a basic new learning task targeting planning, thought organization, mental flexibility, and error awareness.  Pt initially required minimal encouragement for participation prior to beginning activity due to pt report of "I've never done this so I probably won't be any good." Pt eventually agreeable to participate. Pt required max faded to min assist verbal cues to complete task due to decreased working memory, with improvements most notable as pt became more familiar with activity.  Continue per current plan of care.     FIM:  Comprehension Comprehension Mode: Auditory Comprehension: 5-Understands complex 90% of the time/Cues < 10% of the time Expression Expression Mode: Verbal Expression: 5-Expresses basic needs/ideas: With no assist Social Interaction Social Interaction: 5-Interacts appropriately 90% of the time - Needs monitoring or encouragement for participation or interaction. Problem Solving Problem Solving: 4-Solves basic 75 - 89% of the time/requires cueing 10 - 24%  of the time Memory Memory: 4-Recognizes or recalls 75 - 89% of the time/requires cueing 10 - 24% of the time  Pain Pain Assessment Pain Assessment: No/denies pain  Therapy/Group: Individual Therapy  Windell Moulding, M.A. CCC-SLP  Kambry Takacs, Selinda Orion 10/09/2013, 3:16 PM

## 2013-10-09 NOTE — Progress Notes (Signed)
Physical Therapy Weekly Progress Note  Patient Details  Name: Candace Monroe MRN: [id number] Date of Birth: October 26, 1940  Beginning of progress report period: October 02, 2013 End of progress report period: October 09, 2013  Today's Date: 10/09/2013 PT Individual Time: 0915-1000 and 13:00-13:30 (40min)  PT Individual Time Calculation (min): 45 min   Patient has met 5 of 5 short term goals.  Pt is making gains in PT, having progressed from needing maximal assistance for static sitting to needing only distant supervision for this task. She needs minimal assistance for basic transfers and is now able to walk with assistance of 1 person. Her RLE continues to strengthen daily as well as her standing balance. She continues to demonstrate R pushing tendencies, but is able to correct with visual and verbal cues.   Patient continues to demonstrate the following deficits: trunk control, R-sided weakness, impaired timing and sequencing of movements and therefore will continue to benefit from skilled PT intervention to enhance overall performance with activity tolerance, balance, postural control, functional use of  right upper extremity and right lower extremity and coordination.  Patient progressing toward long term goals..  Plan of care revisions: LTGs upgraded due to progress made towards independence. .  PT Short Term Goals Week 1:  PT Short Term Goal 1 (Week 1): Pt will consistently perform supine<>sit with min  A with HOB flat using rail. PT Short Term Goal 1 - Progress (Week 1): Met PT Short Term Goal 2 (Week 1): Pt will perform supine<>sit with min A with rails and HOB flat. PT Short Term Goal 2 - Progress (Week 1): Met PT Short Term Goal 3 (Week 1): Pt will consistently perform bed<>w/c transfer (both directions) with mod A. PT Short Term Goal 3 - Progress (Week 1): Met PT Short Term Goal 4 (Week 1): Pt will perform w/c mobility x50' with mod A and 50% cueing. PT Short Term Goal 4 - Progress (Week  1): Met PT Short Term Goal 5 (Week 1): Pt will perform gait x30' in controlled environment with max A of single therapist. PT Short Term Goal 5 - Progress (Week 1): Met Week 2:  PT Short Term Goal 1 (Week 2): Pt will perform basic transfers wtih S to R/L consistently with cues for technique PT Short Term Goal 2 (Week 2): Pt will perform gait training with Min A x50' with LRAD PT Short Term Goal 3 (Week 2): Pt will perform stairs x5 with 2 rails and Min A  Skilled Therapeutic Interventions/Progress Updates:  Tx focused on functional mobility training, gait training, stairs and NMR via forced use, manual facilitation, and multi-modal cues. Pt up in [facility], ready for PT.   Therapeutic activity Pt propelled WC x75' with S and improved technique with more even stroke length, bil UEs.  Pt performed squat-pivot transfers R/L with Min A and cues for anterior translation.  Bed mobility performed on mat with S only and good rolling technique.  Multiple sit<>stand transfers with Min A and manual faciliation for anterior weight shift.   Gait training  1x30' with Ethelene Hal and Mod A for steering, ACE wrap at RLE 3x20' wtihout device, LUE around therapist shoulders while seated on stool for height difference. Pt had most difficulty maintaining RLE ext in stance, but performance imrpoved with practice. Pt needs cues for posture, L weight-shift, and increasing L step length. Pt challenged without RUE support, but did nice job of improving LE demand in task.   Stairs x 3 with Mod  A for steadying with descent. Pt needed step-by-step sequence cues.   NMR performed throughout with manual facilitation for midline orientation in standing. Mirror provided for visual feedback. Pt continues to lean/push R in standing with RLE adducted, but is able to correct for up to 5 sec with cues. Pt tends to use UEs to correct, so therapy is challenging her to use LEs and trunk to orient to midline.  NMR performed during gait with  manual facilitation for weight shifting L, as well as tactile cues for glutes and R quad in stance.   Pt wanting to return to bed, but encouraged to stay up in [facility] for at least another hour. Pt educated on importance of increased activity for stroke risk reduction.   Second tx focused on gait training and family training for NMR tasks. Pt up in [facility], family encouraged to participate in gym this afternoon.   Performed gait training x20' in similar technique as the morning with therapist on L side without device. Pt  Continues to need assist with L weight shift and upright trunk posture. Towards end of trial, pt was unable to continue LLE step, needing Max A to shift and turn to sit.   Instructed pt/family in supine NMR tasks including bridging, hemi-bridging, and lower trunk rotation x10 each.   Transfers with demo and min A for steadying squat-pivot. Bed mobility with S only and cues for full rolling.  Hand off to SLP.       Therapy Documentation Precautions:  Precautions Precautions: Fall Precaution Comments: Pushes to R side.; midline disorientation Restrictions Weight Bearing Restrictions: No General:   Vital Signs: Therapy Vitals Temp: 98.4 F (36.9 C) Temp Source: Oral Pulse Rate: 72 Resp: 18 BP: 156/76 mmHg Patient Position (if appropriate): Lying Oxygen Therapy SpO2: 92 % O2 Device: None (Room air) Pain: Pain Assessment Pain Assessment: No/denies pain  See FIM for current functional status  Therapy/Group: Individual Therapy  Kennieth Rad, PT, DPT  10/09/2013, 9:52 AM

## 2013-10-09 NOTE — Progress Notes (Signed)
Occupational Therapy Weekly Progress Note  Patient Details  Name: Candace Monroe MRN: [id number] Date of Birth: 1940/06/14  Beginning of progress report period: October 03, 2013 End of progress report period: October 09, 2013  Today's Date: 10/09/2013 OT Individual Time: 0802-0903 OT Individual Time Calculation (min): 61 min    Patient has met 4 of 4 short term goals.  Pt is making excellent progress with her RUE strength and coordination, sitting and standing balance, and ability to perform her self care with minimal A.  Patient continues to demonstrate the following deficits: decreased postural awareness in standing with leaning to the right, limited RLE control for stand step transfers with RW, decreased R FMC to fasten bra and therefore will continue to benefit from skilled OT intervention to enhance overall performance with BADL.  Patient progressing toward long term goals.  Continue plan of care. Pt may need some goals upgraded to Supervision, to be determined next week.   OT Short Term Goals Week 1:  OT Short Term Goal 1 (Week 1): Pt will perform toilet transfer with Mod A in order to increase I in functional transfers.  OT Short Term Goal 1 - Progress (Week 1): Met OT Short Term Goal 2 (Week 1): Pt will perform bathing with Mod A in order to increase I in self care. OT Short Term Goal 2 - Progress (Week 1): Met OT Short Term Goal 3 (Week 1): Pt will perform toileting with Mod A in order to increase I in functional transfers.  OT Short Term Goal 3 - Progress (Week 1): Met OT Short Term Goal 4 (Week 1): Pt will performed seated laundry task , folding clothes, with Mod A and mod cues for seated balance.  OT Short Term Goal 4 - Progress (Week 1): Met Week 2:  OT Short Term Goal 1 (Week 2): Pt will be able to walk with RW into bathroom to access toilet with min A. OT Short Term Goal 2 (Week 2): Pt will be able to stand with Rw while donning pants with min A. OT Short Term Goal 3 (Week  2): Pt will complete tub bench transfers with min A using RW. OT Short Term Goal 4 (Week 2): Pt will demonstrate improved R FMC to fasten her bra without A.   Skilled Therapeutic Interventions/Progress Updates:    Pt seen for BADL retraining of dressing with family education with daughter. To increase the challenge, pt stood with a RW versus standing at the sink when donning clothing over her hips. Pt would lean to the R 40-50-% of the time and need mod A to correct. She continues to need A to fasten her bra, but can now don socks, shoes, and tie both shoes without A.  Pt worked on taking 6 steps from w/c to arm chair then 6 steps to sink and then 6 steps to bed with RW with mod -max A to facilitate swing through of RLE and safe placement of RLE.  Pt provided with cotton arm sleeve for RUE due to frequent skin tears.  Pt then worked on sitting balance at EOB while folding clothing and hanging clothing on hangers. She would fall into a posterior lean 50% of the time, but did self correct and use her trunk muscles to sit upright. Dynamic reaching activities in to R and to L. Pt c/o L shoulder pain with reaching to the R.  She also worked on controlled sit><stand from EOB with hands on thighs. Pt is able to  stand well with min A, but needs mod to max for a controlled descent without UE support. Pt transferred to w/c. Pt with family in the room.  Therapy Documentation Precautions:  Precautions Precautions: Fall Precaution Comments: Pushes to R side.; midline disorientation Restrictions Weight Bearing Restrictions: No   Vital Signs: Therapy Vitals Temp: 98.4 F (36.9 C) Temp Source: Oral Pulse Rate: 72 Resp: 18 BP: 156/76 mmHg Patient Position (if appropriate): Lying Oxygen Therapy SpO2: 92 % O2 Device: None (Room air) Pain: Pain Assessment Pain Assessment: No/denies pain ADL:  See FIM for current functional status  Therapy/Group: Individual Therapy  SAGUIER,JULIA 10/09/2013, 9:28 AM

## 2013-10-09 NOTE — Progress Notes (Signed)
Patient's b/p 180/78 heart rate 64, patient denies head ache, chest pain, dizziness, and/ or pain. Will continue to monitor.  Oval Linsey, RN

## 2013-10-10 ENCOUNTER — Encounter (HOSPITAL_COMMUNITY): Payer: Medicare Other | Admitting: *Deleted

## 2013-10-10 ENCOUNTER — Inpatient Hospital Stay (HOSPITAL_COMMUNITY): Payer: Medicare Other | Admitting: *Deleted

## 2013-10-10 MED ORDER — AMLODIPINE BESYLATE 2.5 MG PO TABS
2.5000 mg | ORAL_TABLET | Freq: Every day | ORAL | Status: DC
  Administered 2013-10-10 – 2013-10-13 (×4): 2.5 mg via ORAL
  Filled 2013-10-10 (×6): qty 1

## 2013-10-10 MED ORDER — LISINOPRIL 5 MG PO TABS
5.0000 mg | ORAL_TABLET | Freq: Two times a day (BID) | ORAL | Status: DC
  Administered 2013-10-10 – 2013-10-13 (×7): 5 mg via ORAL
  Filled 2013-10-10 (×10): qty 1

## 2013-10-10 MED ORDER — LISINOPRIL 10 MG PO TABS: 10.0000 mg | ORAL_TABLET | Freq: Two times a day (BID) | ORAL | Status: DC

## 2013-10-10 NOTE — Progress Notes (Signed)
Candace Monroe is a 73 y.o. female 29-Mar-1940 [id number]  Subjective: C/o elevated BP. Slept well. Feeling OK.  Objective: Vital signs in last 24 hours: Temp:  [97.9 F (36.6 C)-98.1 F (36.7 C)] 97.9 F (36.6 C) (10/10 0520) Pulse Rate:  [50-76] 50 (10/10 0520) Resp:  [17-18] 18 (10/10 0520) BP: (158-196)/(56-86) 176/79 mmHg (10/10 0520) SpO2:  [96 %-97 %] 96 % (10/10 0520) Weight change:  Last BM Date: 10/03/13  Intake/Output from previous day: 10/09 0701 - 10/10 0700 In: 480 [P.O.:480] Out: -  Last cbgs: CBG (last 3)  No results found for this basename: GLUCAP,  in the last 72 hours   Physical Exam General: No apparent distress   HEENT: not dry Lungs: Normal effort. Lungs clear to auscultation, no crackles or wheezes. Cardiovascular: Regular rate and rhythm, no edema Abdomen: S/NT/ND; BS(+) Musculoskeletal:  unchanged Neurological: No new neurological deficits Wounds: N/A    Skin: clear  Aging changes Mental state: Alert, oriented, cooperative    Lab Results: BMET    Component Value Date/Time   NA 136* 10/07/2013 0632   K 4.8 10/07/2013 0632   CL 104 10/07/2013 0632   CO2 21 10/07/2013 0632   GLUCOSE 130* 10/07/2013 0632   BUN 39* 10/07/2013 0632   CREATININE 1.24* 10/07/2013 0632   CALCIUM 9.1 10/07/2013 0632   GFRNONAA 41* 10/07/2013 0632   GFRAA 48* 10/07/2013 0632   CBC    Component Value Date/Time   WBC 9.5 10/05/2013 0602   RBC 4.67 10/05/2013 0602   HGB 14.4 10/05/2013 0602   HCT 42.0 10/05/2013 0602   PLT 205 10/05/2013 0602   MCV 89.9 10/05/2013 0602   MCH 30.8 10/05/2013 0602   MCHC 34.3 10/05/2013 0602   RDW 12.3 10/05/2013 0602   LYMPHSABS 1.9 10/05/2013 0602   MONOABS 0.9 10/05/2013 0602   EOSABS 0.1 10/05/2013 0602   BASOSABS 0.1 10/05/2013 0602    Studies/Results: No results found.  Medications: I have reviewed the patient's current medications.  Assessment/Plan:   1. 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 DVT Prophylaxis/Anticoagulation: Pharmaceutical: Lovenox  3. Pain Management: N/A  4. Depression/Mood: continue lexapro  5. Neuropsych: This patient is capable of making decisions on her own behalf.  6. Skin/Wound Care: Routine pressure relief measures , 7. Fluids/Electrolytes/Nutrition: Monitor I/O. Offer supplements if intake poor. Follow up labs on admit  8. HTN: Blood pressures trending upward and medications resumed today. Will add a low dose Norvasc for SBP>190 BMET 9. Diabetes: No medications for past 1-2 years--reports she lost a lot of weight and last hgb A1c around 5. Will check hgb A1c. Fasting BS 108-135 range.  -dietary ed  10. Dyslipidemia: Continue lipitor.  11. Reflux symptoms appear to be improving on Pepcid  12. Constipated, pt will wait until after therapy for laxative today  Length of stay, days: 8  Walker Kehr , MD 10/10/2013, 9:19 AM

## 2013-10-11 ENCOUNTER — Inpatient Hospital Stay (HOSPITAL_COMMUNITY): Payer: Medicare Other | Admitting: Occupational Therapy

## 2013-10-11 ENCOUNTER — Inpatient Hospital Stay (HOSPITAL_COMMUNITY): Payer: Medicare Other | Admitting: *Deleted

## 2013-10-11 DIAGNOSIS — R062 Wheezing: Secondary | ICD-10-CM

## 2013-10-11 LAB — BASIC METABOLIC PANEL
Anion gap: 12 (ref 5–15)
BUN: 39 mg/dL — AB (ref 6–23)
CO2: 24 meq/L (ref 19–32)
Calcium: 9.2 mg/dL (ref 8.4–10.5)
Chloride: 101 mEq/L (ref 96–112)
Creatinine, Ser: 1.47 mg/dL — ABNORMAL HIGH (ref 0.50–1.10)
GFR calc Af Amer: 39 mL/min — ABNORMAL LOW (ref >90)
GFR calc non Af Amer: 34 mL/min — ABNORMAL LOW (ref >90)
GLUCOSE: 173 mg/dL — AB (ref 70–99)
POTASSIUM: 4 meq/L (ref 3.7–5.3)
SODIUM: 137 meq/L (ref 137–147)

## 2013-10-11 LAB — URIC ACID: URIC ACID, SERUM: 7.6 mg/dL — AB (ref 2.4–7.0)

## 2013-10-11 MED ORDER — IPRATROPIUM-ALBUTEROL 0.5-2.5 (3) MG/3ML IN SOLN
3.0000 mL | Freq: Four times a day (QID) | RESPIRATORY_TRACT | Status: DC
  Administered 2013-10-11 – 2013-10-12 (×4): 3 mL via RESPIRATORY_TRACT
  Filled 2013-10-11 (×4): qty 3

## 2013-10-11 MED ORDER — COLCHICINE 0.6 MG PO TABS
0.6000 mg | ORAL_TABLET | Freq: Two times a day (BID) | ORAL | Status: AC
  Administered 2013-10-11: 0.6 mg via ORAL
  Filled 2013-10-11 (×8): qty 1

## 2013-10-11 NOTE — Progress Notes (Signed)
Occupational Therapy Session Note  Patient Details  Name: Candace Monroe MRN: [id number] Date of Birth: 08/29/1940  Today's Date: 10/11/2013 OT Individual Time: 1000-1030 OT Individual Time Calculation (min): 30 min    Short Term Goals: Week 1:  OT Short Term Goal 1 (Week 1): Pt will perform toilet transfer with Mod A in order to increase I in functional transfers.  OT Short Term Goal 1 - Progress (Week 1): Met OT Short Term Goal 2 (Week 1): Pt will perform bathing with Mod A in order to increase I in self care. OT Short Term Goal 2 - Progress (Week 1): Met OT Short Term Goal 3 (Week 1): Pt will perform toileting with Mod A in order to increase I in functional transfers.  OT Short Term Goal 3 - Progress (Week 1): Met OT Short Term Goal 4 (Week 1): Pt will performed seated laundry task , folding clothes, with Mod A and mod cues for seated balance.  OT Short Term Goal 4 - Progress (Week 1): Met Week 2:  OT Short Term Goal 1 (Week 2): Pt will be able to walk with RW into bathroom to access toilet with min A. OT Short Term Goal 2 (Week 2): Pt will be able to stand with Rw while donning pants with min A. OT Short Term Goal 3 (Week 2): Pt will complete tub bench transfers with min A using RW. OT Short Term Goal 4 (Week 2): Pt will demonstrate improved R FMC to fasten her bra without A.   Skilled Therapeutic Interventions/Progress Updates:    Pt. Sitting in wc upon OT arrival.  Grand daughter present.  Pt. Rolled wc to gym with increased time.  Transferred to nustep with min assist.  .  Pt used nustep for 7 minutes 15 seconds at 307 steps at 3 work load.   .Pt. Rolled self back to room with min assist.  Pt tolerated session well Therapy Documentation Precautions:  Precautions Precautions: Fall Precaution Comments: Pushes to R side.; midline disorientation Restrictions Weight Bearing Restrictions: No    Vital Signs: Therapy Vitals Temp: 97.5 F (36.4 C) Temp Source: Oral Pulse Rate:  75 Resp: 18 BP: 144/43 mmHg Patient Position (if appropriate): Lying Oxygen Therapy SpO2: 94 % O2 Device: None (Room air) Pain:  3/10 RIGHT FOOT PAIN      See FIM for current functional status  Therapy/Group: Individual Therapy  ,  J 10/11/2013, 6:04 PM  

## 2013-10-11 NOTE — Progress Notes (Signed)
Candace Monroe is a 73 y.o. female 1940/07/08 [id number]  Subjective: C/o elevated BP - better. Slept well. Feeling OK. C/o wheezing. No CP, no SOB. Mild cough - not new.  C/o severe pain in R foot x 1 day  Objective: Vital signs in last 24 hours: Temp:  [98 F (36.7 C)-98.4 F (36.9 C)] 98 F (36.7 C) (10/11 0518) Pulse Rate:  [70-80] 71 (10/11 0518) Resp:  [17-18] 17 (10/11 0518) BP: (158-161)/(38-66) 158/38 mmHg (10/11 0518) SpO2:  [94 %-97 %] 94 % (10/11 0518) Weight change:  Last BM Date: 10/09/13  Intake/Output from previous day: 10/10 0701 - 10/11 0700 In: 720 [P.O.:720] Out: -  Last cbgs: CBG (last 3)  No results found for this basename: GLUCAP,  in the last 72 hours   Physical Exam General: No apparent distress   HEENT: not dry Lungs: B rhonchi Cardiovascular: Regular rate and rhythm, no edema Abdomen: S/NT/ND; BS(+) Musculoskeletal: R foot is very tender to palpation (mid-foot) Neurological: No new neurological deficits Wounds: N/A    Skin: clear  Aging changes Mental state: Alert, oriented, cooperative    Lab Results: BMET    Component Value Date/Time   NA 136* 10/07/2013 0632   K 4.8 10/07/2013 0632   CL 104 10/07/2013 0632   CO2 21 10/07/2013 0632   GLUCOSE 130* 10/07/2013 0632   BUN 39* 10/07/2013 0632   CREATININE 1.24* 10/07/2013 0632   CALCIUM 9.1 10/07/2013 0632   GFRNONAA 41* 10/07/2013 0632   GFRAA 48* 10/07/2013 0632   CBC    Component Value Date/Time   WBC 9.5 10/05/2013 0602   RBC 4.67 10/05/2013 0602   HGB 14.4 10/05/2013 0602   HCT 42.0 10/05/2013 0602   PLT 205 10/05/2013 0602   MCV 89.9 10/05/2013 0602   MCH 30.8 10/05/2013 0602   MCHC 34.3 10/05/2013 0602   RDW 12.3 10/05/2013 0602   LYMPHSABS 1.9 10/05/2013 0602   MONOABS 0.9 10/05/2013 0602   EOSABS 0.1 10/05/2013 0602   BASOSABS 0.1 10/05/2013 0602    Studies/Results: No results found.  Medications: I have reviewed the patient's current medications.  Assessment/Plan:   1.  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 DVT Prophylaxis/Anticoagulation: Pharmaceutical: Lovenox  3. Pain Management: N/A  4. Depression/Mood: continue lexapro  5. Neuropsych: This patient is capable of making decisions on her own behalf.  6. Skin/Wound Care: Routine pressure relief measures , 7. Fluids/Electrolytes/Nutrition: Monitor I/O. Offer supplements if intake poor. Follow up labs on admit  8. HTN: Blood pressures trending upward and medications resumed today. Will add a low dose Norvasc for SBP>190 BMET 9. Diabetes: No medications for past 1-2 years--reports she lost a lot of weight and last hgb A1c around 5. Will check hgb A1c. Fasting BS 108-135 range.  -dietary ed  10. Dyslipidemia: Continue lipitor.  11. Reflux symptoms appear to be improving on Pepcid  12. Constipated, pt will wait until after therapy for laxative today. 13. R mid-foot pain: possible gout. Labs. Empiric Colchicine bid x 4 days 14. Wheezing. Start Combivent. May need to switch from ACE to ARB. May need a CXR if not better.  Length of stay, days: 9  Walker Kehr , MD 10/11/2013, 8:58 AM

## 2013-10-11 NOTE — Progress Notes (Signed)
Occupational Therapy Session Note  Patient Details  Name: Candace Monroe MRN: [id number] Date of Birth: [birth year]  Today's Date: 10/11/2013 OT Individual Time: 1400-1430 OT Individual Time Calculation (min): 30 min    Short Term Goals: Week 1:  OT Short Term Goal 1 (Week 1): Pt will perform toilet transfer with Mod A in order to increase I in functional transfers.  OT Short Term Goal 1 - Progress (Week 1): Met OT Short Term Goal 2 (Week 1): Pt will perform bathing with Mod A in order to increase I in self care. OT Short Term Goal 2 - Progress (Week 1): Met OT Short Term Goal 3 (Week 1): Pt will perform toileting with Mod A in order to increase I in functional transfers.  OT Short Term Goal 3 - Progress (Week 1): Met OT Short Term Goal 4 (Week 1): Pt will performed seated laundry task , folding clothes, with Mod A and mod cues for seated balance.  OT Short Term Goal 4 - Progress (Week 1): Met Week 2:  OT Short Term Goal 1 (Week 2): Pt will be able to walk with RW into bathroom to access toilet with min A. OT Short Term Goal 2 (Week 2): Pt will be able to stand with Rw while donning pants with min A. OT Short Term Goal 3 (Week 2): Pt will complete tub bench transfers with min A using RW. OT Short Term Goal 4 (Week 2): Pt will demonstrate improved R FMC to fasten her bra without A.   Skilled Therapeutic Interventions/Progress Updates:    1:1 tranfer training with squat pivots with focus on recall of setup and sequence requiring mod cuing and occasional tactile cues f to maintain forward weight shift.  Also worked on dynamic standing balance with focus on maintaining maintain muscle activity in right le with standing activity of alternating lateral weights with and without UE support to improve balance with ADL tasks at sink and shower level.  functional ambulation with RW with max tactile cues for upright posture and weight shift to left to clear right foot with min to mod A  Pt return to room  to bed with husband present at end of session.  Therapy Documentation Precautions:  Precautions Precautions: Fall Precaution Comments: Pushes to R side.; midline disorientation Restrictions Weight Bearing Restrictions: No General:   Vital Signs: Therapy Vitals Temp: 98.4 F (36.9 C) Temp Source: Oral Pulse Rate: 81 Resp: 18 BP: 173/61 mmHg Patient Position (if appropriate): Lying Oxygen Therapy SpO2: 100 % O2 Device: None (Room air) Pain:  mild discomfort in toe on right foot  See FIM for current functional status  Therapy/Group: Individual Therapy  Willeen Cass [facility] 10/11/2013, 10:42 PM

## 2013-10-12 ENCOUNTER — Inpatient Hospital Stay (HOSPITAL_COMMUNITY): Payer: Medicare Other | Admitting: Speech Pathology

## 2013-10-12 ENCOUNTER — Inpatient Hospital Stay (HOSPITAL_COMMUNITY): Payer: Medicare Other

## 2013-10-12 ENCOUNTER — Inpatient Hospital Stay (HOSPITAL_COMMUNITY): Payer: Medicare Other | Admitting: Occupational Therapy

## 2013-10-12 LAB — BASIC METABOLIC PANEL
ANION GAP: 15 (ref 5–15)
BUN: 38 mg/dL — ABNORMAL HIGH (ref 6–23)
CHLORIDE: 102 meq/L (ref 96–112)
CO2: 22 meq/L (ref 19–32)
Calcium: 9.1 mg/dL (ref 8.4–10.5)
Creatinine, Ser: 1.44 mg/dL — ABNORMAL HIGH (ref 0.50–1.10)
GFR calc Af Amer: 40 mL/min — ABNORMAL LOW (ref >90)
GFR calc non Af Amer: 35 mL/min — ABNORMAL LOW (ref >90)
GLUCOSE: 142 mg/dL — AB (ref 70–99)
Potassium: 4.4 mEq/L (ref 3.7–5.3)
SODIUM: 139 meq/L (ref 137–147)

## 2013-10-12 LAB — GLUCOSE, CAPILLARY
GLUCOSE-CAPILLARY: 158 mg/dL — AB (ref 70–99)
GLUCOSE-CAPILLARY: 104 mg/dL — AB (ref 70–99)

## 2013-10-12 MED ORDER — INSULIN ASPART 100 UNIT/ML ~~LOC~~ SOLN
0.0000 [IU] | Freq: Three times a day (TID) | SUBCUTANEOUS | Status: DC
  Administered 2013-10-13: 2 [IU] via SUBCUTANEOUS

## 2013-10-12 MED ORDER — ALBUTEROL SULFATE (2.5 MG/3ML) 0.083% IN NEBU: 2.5000 mg | INHALATION_SOLUTION | Freq: Four times a day (QID) | RESPIRATORY_TRACT | Status: DC | PRN

## 2013-10-12 MED ORDER — IPRATROPIUM-ALBUTEROL 0.5-2.5 (3) MG/3ML IN SOLN: 3.0000 mL | Freq: Four times a day (QID) | RESPIRATORY_TRACT | Status: DC | PRN

## 2013-10-12 MED ORDER — SODIUM CHLORIDE 0.45 % IV SOLN
INTRAVENOUS | Status: DC
  Administered 2013-10-12 – 2013-10-16 (×4): via INTRAVENOUS

## 2013-10-12 NOTE — Progress Notes (Signed)
Physical Therapy Session Note  Patient Details  Name: Candace Monroe MRN: [id number] Date of Birth: [birth year]  Today's Date: 10/12/2013 PT Individual Time: 0900-1000 PT Individual Time Calculation (min): 60 min   Short Term Goals: Week 2:  PT Short Term Goal 1 (Week 2): Pt will perform basic transfers wtih S to R/L consistently with cues for technique PT Short Term Goal 2 (Week 2): Pt will perform gait training with Min A x50' with LRAD PT Short Term Goal 3 (Week 2): Pt will perform stairs x5 with 2 rails and Min A  Skilled Therapeutic Interventions/Progress Updates:   Daughter [name] stated that pt's granddaughter assisted pt with  HEP of bil bridging, 1/2 bridging and lower trunk rotation over the weekend.  neuromuscular re-education via forced use, VCS, manual cues for w/c propulsion, NuStep using bil UEs and bil LEs at level 4 x 8 minutes, LEs only x 2 minutes;  gait with ACE R foot, RW.  W/c propulsion x 70' using bil UEs with supervision, slowly but able to steer without assistance. Pt with better spontaneous use of RUE for function.  W/c> NuStep with mod assist squat pivot to R, tactile cues for hand placement. Bounce/pass basketball while sitting on backless bench to facilitate forward wt shift; scooting forward/backward without use of UEs for pelvic dissociation and L trunk shortening/lengthening for trunk righting. Pt with LOB R x 2 during L hip scooting forward/backward.  Gait with RW x 45', x 25' with max assist for wt shifting, sequencing, R stance stability, steering and progression of RW.  Pt returned to room in care of family.    Therapy Documentation Precautions:  Precautions Precautions: Fall Precaution Comments: Pushes to R side.; midline disorientation Restrictions Weight Bearing Restrictions: No   Pain: Pain Assessment Pain Assessment: No/denies pain Pain Score: 0-No pain           See FIM for current functional status  Therapy/Group: Individual  Therapy  Tekela Garguilo 10/12/2013, 12:18 PM

## 2013-10-12 NOTE — Progress Notes (Signed)
Occupational Therapy Session Note  Patient Details  Name: Candace Monroe MRN: [id number] Date of Birth: 1940-06-17  Today's Date: 10/12/2013 OT Individual Time: 1115-1200 OT Individual Time Calculation (min): 45 min    Skilled Therapeutic Interventions/Progress Updates:    Pt worked on standing balance, RLE strengthening, and maintaining attention to the right side.  She needed min assist for transfer stand pivot to the EOM with decreased efficiency with advancing the RLE.  Integrated standing with reaching in different directions to grasp clothespins with the RUE and place on the bars of the rack.  Pt with frequent pushing and loss of balance to the right side when standing requiring overall mod assist to self correct.  Her right knee tends to slowly buckle as she lacks sensation and attention to maintain it in the extended position with static standing.  Transitioned to weightshifts in standing with pt demonstrating increased resistance to shifting her weight to the left side.  Setup activity to promote active right knee and hip extension as well as left weight shift while reaching up and laterally to the left side.  Finished session with simple functional mobility.  Pt with decreased ability to maintain stance phase on the RLE for adequate step length on the left.  Pt also tends to fall into flexion in her trunk as well.  Mod facilitation for taking a few steps to the therapy mat.  Pt transferred back to the wheelchair with min assist stand pivot and returned to room.   Therapy Documentation Precautions:  Precautions Precautions: Fall Precaution Comments: Pushes to R side.; midline disorientation Restrictions Weight Bearing Restrictions: No  Pain: Pain Assessment Pain Assessment: No/denies pain Pain Score: 0-No pain ADL: See FIM for current functional status  Therapy/Group: Individual Therapy  Alacia Rehmann OTR/L 10/12/2013, 12:15 PM

## 2013-10-12 NOTE — Progress Notes (Signed)
RT Note: Pt states she does not want breathing treatment. Pt says it makes her "nervous" & she will let RT know if she needs treatment.

## 2013-10-12 NOTE — Progress Notes (Signed)
Occupational Therapy Session Note  Patient Details  Name: Candace Monroe MRN: [id number] Date of Birth: [birth year]  Today's Date: 10/12/2013 OT Individual Time: 1300-1346 OT Individual Time Calculation (min): 46 min    Skilled Therapeutic Interventions/Progress Updates:    Pt worked on Brewing technologist for her balance, trunk alignment, and RLE strength.  Had pt work mostly in standing with pre-gait activities such as stepping forward with the LLE while maintaining stance phase on the right side.  Provided bedside table for pt to place her LUE on while standing as to help with weightshifting over to the left since pt pushes.  She continues to demonstrate decreased sustained active hip and knee extension in the RLE.  Pt also with noted motor planning difficulty as well when attempting to step up on a small step with the LLE while maintaining right knee extension.  Frequent LOB posteriorly and to the left requiring mod facilitation to correct.  Pt with no attempt to correct standing balance without verbal and physical cueing.  Integrated squat to stand as well to help increased proprioception in the RLE.  When UE support is taken away in standing pt falls into more trunk flexion and cannot keep her balance without constant mod assist.  Finished session with short walk to the mat for integration of what was practiced.  Pt with mod assist and mod demonstrational cueing for taking larger step on the left side and not falling into trunk and hip flexion.   Therapy Documentation Precautions:  Precautions Precautions: Fall Precaution Comments: Pushes to R side.; midline disorientation Restrictions Weight Bearing Restrictions: No  Pain: Pain Assessment Pain Assessment: No/denies pain Pain Score: 0-No pain ADL: See FIM for current functional status  Therapy/Group: Individual Therapy  Perri Lamagna OTR/L 10/12/2013, 2:18 PM

## 2013-10-12 NOTE — Plan of Care (Signed)
Problem: RH SKIN INTEGRITY Goal: RH STG SKIN FREE OF INFECTION/BREAKDOWN Remain free from infection/breakdown while on rehab with min assist  Outcome: Progressing Protective sleeve on RUE to prevent further skin tears.

## 2013-10-12 NOTE — Plan of Care (Signed)
Problem: RH BLADDER ELIMINATION Goal: RH STG MANAGE BLADDER WITH ASSISTANCE STG Manage Bladder With min Assistance  Outcome: Progressing Occasional episodes of urgency incontinence.

## 2013-10-12 NOTE — Progress Notes (Signed)
Speech Language Pathology Weekly Progress and Session Note  Patient Details  Name: Candace Monroe MRN: [id number] Date of Birth: 1940/04/12  Beginning of progress report period: October 05, 2013 End of progress report period: October 12, 2013  Today's Date: 10/12/2013 SLP Individual Time: 1005-1035 SLP Individual Time Calculation (min): 30 min  Short Term Goals: Week 1: SLP Short Term Goal 1 (Week 1): Pt will label 2 physical and 1 cognitive change post CVA with Min question cues  SLP Short Term Goal 1 - Progress (Week 1): Partly met SLP Short Term Goal 2 (Week 1): Pt will request help as needed with Mod question cues SLP Short Term Goal 2 - Progress (Week 1): Met SLP Short Term Goal 3 (Week 1): Pt will demonstrate complex problem solving with Min assist to self-monitor and correct errors SLP Short Term Goal 3 - Progress (Week 1): Met SLP Short Term Goal 4 (Week 1): Pt will utilize external aids to assist with recal of new information with Min cues SLP Short Term Goal 4 - Progress (Week 1): Met    New Short Term Goals: Week 2: SLP Short Term Goal 1 (Week 2): Pt will label 2 physical and 1 cognitive change post CVA with Min question cues  SLP Short Term Goal 2 (Week 2): Pt will request help as needed with Min question cues SLP Short Term Goal 3 (Week 2): Pt will demonstrate complex problem solving with supervision to self-monitor and correct errors SLP Short Term Goal 4 (Week 2): Pt will utilize external aids to assist with recal of new information with supervision.  Weekly Progress Updates:  Pt made functional gains this reporting period and has met 3 out of 4 short term goals due to improved problem solving, use of external aids for memory, and asking for assistance as needed.  Pt currently requires overall min assist for semi-complex cognitive tasks due to decreased awareness of deficits.  Pt and family education is ongoing.  Pt would continue to benefit from ST while inpatient in order  to maximize functional independence and reduce burden of care upon discharge.     Intensity: Minumum of 1-2 x/day, 30 to 90 minutes Frequency: 3 out of 7 days Duration/Length of Stay: 17-19 days Treatment/Interventions: Cognitive remediation/compensation;Cueing hierarchy;Functional tasks;Medication managment;Internal/external aids;Patient/family education   Daily Session  Skilled Therapeutic Interventions: Pt was seen for skilled speech therapy targeting cognitive goals.  Upon arrival, pt was seated upright in wheelchair with family present.  Family excused themselves with initiation of structured ST activities; however, SLP was able to complete basic education with pt's husband at the end of today's session regarding current progress and plan of care.  SLP facilitated the session with a structured task targeting semi-complex problem solving, abstract reasoning and mental flexibility with pt requiring overall min assist cues to complete task due to decreased working memory.  Additionally, pt was able to complete basic functional math calculations related to the abovementioned tasks with 90% accuracy and supervision cues for organization and error awareness.  Recommend that pt be decreased to 3x/week as pt and pt's family continue to endorse that pt is near her cognitive baseline.  Pt and family in agreement with changes to current plan of care.         FIM:  Comprehension Comprehension Mode: Auditory Comprehension: 5-Follows basic conversation/direction: With extra time/assistive device Expression Expression Mode: Verbal Expression: 5-Expresses basic needs/ideas: With extra time/assistive device Social Interaction Social Interaction: 5-Interacts appropriately 90% of the time - Needs monitoring  or encouragement for participation or interaction. Problem Solving Problem Solving: 4-Solves basic 75 - 89% of the time/requires cueing 10 - 24% of the time Memory Memory: 4-Recognizes or recalls 75 -  89% of the time/requires cueing 10 - 24% of the time  Pain Pain Assessment Pain Assessment: No/denies pain  Therapy/Group: Individual Therapy  Windell Moulding, M.A. CCC-SLP  Viviann Broyles, Selinda Orion 10/12/2013, 12:59 PM

## 2013-10-12 NOTE — Progress Notes (Signed)
[facility] PHYSICAL MEDICINE & REHABILITATION     PROGRESS NOTE 73 year old female with history of HTN, diabetes (off medications), right breast mass, ongoing tobacco use who was admitted to [facility] on 09/27/13 with right sided weakness. Blood pressure 240/100 at admission. CT head without acute changes. MRI brain done revealing acute lacunar infarct left frontal lobe peri-Rolandic white matter, moderate to severe underlying chronic small vessel disease. 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 Cardiac echo with EF 50-60% with no wall abnormality, trace MVR. She was evaluated by Dr. 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  A  review of systems has been performed and if not noted above is otherwise negative.   Objective: Vital Signs: Blood pressure 146/60, pulse 40, temperature 98.2 F (36.8 C), temperature source Oral, resp. rate 18, height 5' (1.524 m), weight 55.838 kg (123 lb 1.6 oz), SpO2 100.00%. No results found. No results found for this basename: WBC, HGB, HCT, PLT,  in the last 72 hours  Recent Labs  10/11/13 0930  NA 137  K 4.0  CL 101  GLUCOSE 173*  BUN 39*  CREATININE 1.47*  CALCIUM 9.2   CBG (last 3)  No results found for this basename: GLUCAP,  in the last 72 hours  Wt Readings from Last 3 Encounters:  10/07/13 55.838 kg (123 lb 1.6 oz)  10/02/13 57.607 kg (127 lb)    Physical Exam:  Nursing note and vitals reviewed.  Constitutional: She is oriented to person, place, and time. She appears well-developed and well-nourished.   HENT: Oral mucosa pink and moist  Head: Normocephalic and atraumatic.  Eyes: Conjunctivae are normal. Pupils are equal, round, and reactive to light.  Neck: Normal range of motion. Neck supple.  Cardiovascular:  Normal rate and regular rhythm.  Respiratory: Effort normal. . She has no rales. She exhibits no tenderness.  GI: Soft. Bowel sounds are normal. She exhibits no distension. There is no tenderness.  Musculoskeletal: She exhibits no edema and no tenderness.  Neurological: She is alert and oriented to person, place, and time. Sensory intake Speech clear. Able to follow two step commands without difficulty. Right hemiparesis--deltoid 3+/5, biceps/triceps 4/5, intrinsics 4/5, HF 3+/4, KE 3-/5, PF 3-/5, DF trace. Skin: Skin is warm and dry. On ext, no evidence of stasis dermatitis or heel breakdown Psychiatric: She has a normal mood and affect. Her behavior is normal. Judgment and thought content normal   Assessment/Plan: 1. Functional deficits secondary to left frontal infarct which require 3+ hours per day of interdisciplinary therapy in a comprehensive inpatient rehab setting. Physiatrist is providing close team supervision and 24 hour management of active medical problems listed below. Physiatrist and rehab team continue to assess barriers to discharge/monitor patient progress toward functional and medical goals.  FIM: FIM - Bathing Bathing Steps Patient Completed: Chest;Right Arm;Left Arm;Abdomen;Front perineal area;Buttocks;Right upper leg;Left upper leg;Right lower leg (including foot);Left lower leg (including foot) Bathing: 4: Steadying assist  FIM - Upper Body Dressing/Undressing Upper body dressing/undressing steps patient completed: Thread/unthread right sleeve of pullover shirt/dresss;Thread/unthread left sleeve of pullover shirt/dress;Put head through opening of pull over shirt/dress;Pull shirt over trunk;Thread/unthread left bra strap;Thread/unthread right bra strap Upper body dressing/undressing: 4: Min-Patient completed 75 plus % of tasks FIM - Lower Body Dressing/Undressing Lower body dressing/undressing steps patient  completed: Thread/unthread right underwear leg;Thread/unthread  left underwear leg;Thread/unthread right pants leg;Thread/unthread left pants leg;Pull pants up/down;Don/Doff left sock;Don/Doff right sock;Don/Doff right shoe;Don/Doff left shoe;Fasten/unfasten right shoe;Pull underwear up/down;Fasten/unfasten left shoe Lower body dressing/undressing: 4: Min-Patient completed 75 plus % of tasks (mod A to steady balance in standing)  FIM - Toileting Toileting steps completed by patient: Performs perineal hygiene Toileting Assistive Devices: Grab bar or rail for support Toileting: 2: Max-Patient completed 1 of 3 steps  FIM - Radio producer Devices: Grab bars Toilet Transfers: 3-To toilet/BSC: Mod A (lift or lower assist);3-From toilet/BSC: Mod A (lift or lower assist)  FIM - Bed/Chair Transfer Bed/Chair Transfer Assistive Devices: Arm rests Bed/Chair Transfer: 3: Bed > Chair or W/C: Mod A (lift or lower assist);3: Chair or W/C > Bed: Mod A (lift or lower assist)  FIM - Locomotion: Wheelchair Distance: 75 Locomotion: Wheelchair: 2: Travels 50 - 149 ft with supervision, cueing or coaxing FIM - Locomotion: Ambulation Locomotion: Ambulation Assistive Devices: Nutritional therapist Ambulation/Gait Assistance: 3: Mod assist Locomotion: Ambulation: 1: Travels less than 50 ft with moderate assistance (Pt: 50 - 74%)  Comprehension Comprehension Mode: Auditory Comprehension: 5-Understands basic 90% of the time/requires cueing < 10% of the time  Expression Expression Mode: Verbal Expression: 5-Expresses basic needs/ideas: With extra time/assistive device  Social Interaction Social Interaction Mode: Asleep Social Interaction: 5-Interacts appropriately 90% of the time - Needs monitoring or encouragement for participation or interaction.  Problem Solving Problem Solving Mode: Asleep Problem Solving: 4-Solves basic 75 - 89% of the time/requires cueing 10 - 24% of the time  Memory Memory Mode: Asleep Memory: 5-Recognizes or recalls 90% of  the time/requires cueing < 10% of the time  Medical Problem List and Plan:  1. 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 DVT Prophylaxis/Anticoagulation: Pharmaceutical: Lovenox  3. Pain Management: N/A  4. Depression/Mood: continue lexapro  5. Neuropsych: This patient is capable of making decisions on her own behalf.  6. Skin/Wound Care: Routine pressure relief measures , 7. Fluids/Electrolytes/Nutrition: Monitor I/O. Offer supplements if intake poor. GFR lower will hydrate with IVF 8. HTN: Blood pressures trending upward and medications resumed today. Permissive hypertension to allow perfusion. Will monitor every 8 hours and titrate medication to home dose.  9. Diabetes: No medications for past 1-2 years--reports she lost a lot of weight and last hgb A1c around 5. Will check hgb A1c. Fasting BS 108-135 range.  -dietary ed  10. Dyslipidemia: Continue lipitor.  11.  Reflux symptoms appear to be improving on Pepcid 12.  Constipated, pt will wait until after therapy for laxative today   LOS (Days) 10 A FACE TO FACE EVALUATION WAS PERFORMED  Charlett Blake 10/12/2013 7:36 AM

## 2013-10-13 ENCOUNTER — Inpatient Hospital Stay (HOSPITAL_COMMUNITY): Payer: Medicare Other | Admitting: Occupational Therapy

## 2013-10-13 ENCOUNTER — Inpatient Hospital Stay (HOSPITAL_COMMUNITY): Payer: Medicare Other | Admitting: *Deleted

## 2013-10-13 LAB — GLUCOSE, CAPILLARY
Glucose-Capillary: 129 mg/dL — ABNORMAL HIGH (ref 70–99)
Glucose-Capillary: 118 mg/dL — ABNORMAL HIGH (ref 70–99)
Glucose-Capillary: 159 mg/dL — ABNORMAL HIGH (ref 70–99)
Glucose-Capillary: 91 mg/dL (ref 70–99)

## 2013-10-13 NOTE — Progress Notes (Signed)
Orthopedic Tech Progress Note Patient Details:  Candace Monroe 12/04/1940 [id number] Called order in to [facility]. Patient ID: Candace Monroe, female   DOB: 25-Mar-1940, 73 y.o.   MRN: [id number]   Darrol Poke 10/13/2013, 1:44 PM

## 2013-10-13 NOTE — Progress Notes (Signed)
[facility] PHYSICAL MEDICINE & REHABILITATION     PROGRESS NOTE 73 year old female with history of HTN, diabetes (off medications), right breast mass, ongoing tobacco use who was admitted to [facility] on 09/27/13 with right sided weakness. Blood pressure 240/100 at admission. CT head without acute changes. MRI brain done revealing acute lacunar infarct left frontal lobe peri-Rolandic white matter, moderate to severe underlying chronic small vessel disease. 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 Cardiac echo with EF 50-60% with no wall abnormality, trace MVR. She was evaluated by Dr. Irish Elders who recommended full strength ASA for thrombotic stroke due to uncontrolled HTN, tobacco use and diabetes    Subjective/Complaints: Pt without foot pain No CP, SOB, no bowel or bladder concerns  A  review of systems has been performed and if not noted above is otherwise negative.   Objective: Vital Signs: Blood pressure 121/65, pulse 72, temperature 98 F (36.7 C), temperature source Oral, resp. rate 18, height 5' (1.524 m), weight 55.838 kg (123 lb 1.6 oz), SpO2 98.00%. No results found. No results found for this basename: WBC, HGB, HCT, PLT,  in the last 72 hours  Recent Labs  10/11/13 0930 10/12/13 0833  NA 137 139  K 4.0 4.4  CL 101 102  GLUCOSE 173* 142*  BUN 39* 38*  CREATININE 1.47* 1.44*  CALCIUM 9.2 9.1   CBG (last 3)   Recent Labs  10/12/13 1752 10/12/13 2134 10/13/13 0651  GLUCAP 158* 104* 129*    Wt Readings from Last 3 Encounters:  10/07/13 55.838 kg (123 lb 1.6 oz)  10/02/13 57.607 kg (127 lb)    Physical Exam:  Nursing note and vitals reviewed.  Constitutional: She is oriented to person, place, and time. She appears well-developed and well-nourished.   HENT: Oral mucosa pink and moist  Head: Normocephalic and atraumatic.  Eyes: Conjunctivae are normal. Pupils are equal, round, and reactive to light.  Neck: Normal  range of motion. Neck supple.  Cardiovascular: Normal rate and regular rhythm.  Respiratory: Effort normal. . She has no rales. She exhibits no tenderness.  GI: Soft. Bowel sounds are normal. She exhibits no distension. There is no tenderness.  Musculoskeletal: She exhibits no edema and no tenderness.  Neurological: She is alert and oriented to person, place, and time. Sensory intake Speech clear. Able to follow two step commands without difficulty. Right hemiparesis--deltoid 3+/5, biceps/triceps 4/5, intrinsics 4/5, HF 4-/5, KE 4-/5, PF 3-/5, DF 2-. Skin: Skin is warm and dry. On ext, no evidence of stasis dermatitis or heel breakdown Psychiatric: She has a normal mood and affect. Her behavior is normal. Judgment and thought content normal   Assessment/Plan: 1. Functional deficits secondary to left frontal infarct which require 3+ hours per day of interdisciplinary therapy in a comprehensive inpatient rehab setting. Physiatrist is providing close team supervision and 24 hour management of active medical problems listed below. Physiatrist and rehab team continue to assess barriers to discharge/monitor patient progress toward functional and medical goals.  FIM: FIM - Bathing Bathing Steps Patient Completed: Chest;Right Arm;Left Arm;Abdomen;Front perineal area;Buttocks;Right upper leg;Left upper leg;Right lower leg (including foot);Left lower leg (including foot) Bathing: 4: Steadying assist  FIM - Upper Body Dressing/Undressing Upper body dressing/undressing steps patient completed: Thread/unthread right sleeve of pullover shirt/dresss;Thread/unthread left sleeve of pullover shirt/dress;Put head through opening of pull over shirt/dress;Pull shirt over trunk;Thread/unthread left bra strap;Thread/unthread right bra strap Upper body dressing/undressing: 4: Min-Patient completed 75 plus % of tasks  FIM - Lower Body Dressing/Undressing Lower body dressing/undressing steps patient completed:  Thread/unthread right underwear leg;Thread/unthread left underwear leg;Thread/unthread right pants leg;Thread/unthread left pants leg;Pull pants up/down;Don/Doff left sock;Don/Doff right sock;Don/Doff right shoe;Don/Doff left shoe;Fasten/unfasten right shoe;Pull underwear up/down;Fasten/unfasten left shoe Lower body dressing/undressing: 4: Min-Patient completed 75 plus % of tasks (mod A to steady balance in standing)  FIM - Toileting Toileting steps completed by patient: Performs perineal hygiene Toileting Assistive Devices: Grab bar or rail for support Toileting: 2: Max-Patient completed 1 of 3 steps  FIM - Radio producer Devices: Grab bars Toilet Transfers: 3-To toilet/BSC: Mod A (lift or lower assist);3-From toilet/BSC: Mod A (lift or lower assist)  FIM - Bed/Chair Transfer Bed/Chair Transfer Assistive Devices: Arm rests Bed/Chair Transfer: 3: Bed > Chair or W/C: Mod A (lift or lower assist);3: Chair or W/C > Bed: Mod A (lift or lower assist)  FIM - Locomotion: Wheelchair Distance: 150 Locomotion: Wheelchair: 5: Travels 150 ft or more: maneuvers on rugs and over door sills with supervision, cueing or coaxing FIM - Locomotion: Ambulation Locomotion: Ambulation Assistive Devices: Administrator Ambulation/Gait Assistance: 2: Max assist Locomotion: Ambulation: 1: Travels less than 50 ft with maximal assistance (Pt: 25 - 49%)  Comprehension Comprehension Mode: Auditory Comprehension: 5-Follows basic conversation/direction: With extra time/assistive device  Expression Expression Mode: Verbal Expression: 5-Expresses basic needs/ideas: With extra time/assistive device  Social Interaction Social Interaction Mode: Asleep Social Interaction: 5-Interacts appropriately 90% of the time - Needs monitoring or encouragement for participation or interaction.  Problem Solving Problem Solving Mode: Asleep Problem Solving: 4-Solves basic 75 - 89% of the  time/requires cueing 10 - 24% of the time  Memory Memory Mode: Asleep Memory: 4-Recognizes or recalls 75 - 89% of the time/requires cueing 10 - 24% of the time  Medical Problem List and Plan:  1. 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 DVT Prophylaxis/Anticoagulation: Pharmaceutical: Lovenox  3. Pain Management: N/A  4. Depression/Mood: continue lexapro  5. Neuropsych: This patient is capable of making decisions on her own behalf.  6. Skin/Wound Care: Routine pressure relief measures , 7. Fluids/Electrolytes/Nutrition: Monitor I/O. Offer supplements if intake poor. GFR lower will hydrate with IVF 8. HTN: Blood pressures trending upward and medications resumed today. Permissive hypertension to allow perfusion. Will monitor every 8 hours and titrate medication to home dose.  9. Diabetes: No medications for past 1-2 years--reports she lost a lot of weight and last hgb A1c around 5. Will check hgb A1c. Fasting BS 108-135 range.  -dietary ed  10. Dyslipidemia: Continue lipitor.  11.  Reflux symptoms appear to be improving on Pepcid    LOS (Days) 11 A FACE TO FACE EVALUATION WAS PERFORMED  Arletha Marschke E 10/13/2013 7:54 AM

## 2013-10-13 NOTE — Progress Notes (Signed)
Physical Therapy Session Note  Patient Details  Name: Candace Monroe MRN: [id number] Date of Birth: 07-Aug-1940  Today's Date: 10/13/2013 PT Individual Time: [id number] (Co-tx with rec therapist) PT Individual Time Calculation (min): 60 min   Short Term Goals: Week 2:  PT Short Term Goal 1 (Week 2): Pt will perform basic transfers wtih S to R/L consistently with cues for technique PT Short Term Goal 2 (Week 2): Pt will perform gait training with Min A x50' with LRAD PT Short Term Goal 3 (Week 2): Pt will perform stairs x5 with 2 rails and Min A  Skilled Therapeutic Interventions/Progress Updates:    Co-treatment with rec therapist for initial 30 minutes; individual treatment for final 30 minutes. Pt received seated in w/c accompanied by daughter; agreeable to therapy. Session focused on increasing pt independence with functional transfers, dynamic standing balance, gait, and stair negotiation. Per daughter, pt's family adding modifying stairs to enable pt to be able to reach bilat rails. Pt performed w/c mobility x150' in controlled environment with bilat UE's and supervision, min cueing for obstacle negotiation. See below for detailed description of NMR/balance interventions and righting reactions.  Pt performed blocked practice of squat pivot transfers from w/c<>mat table with min guard-min A, 50% cueing for setup with focus on awareness of foot positioning. Performed gait x60' and 90' in controlled environment with rolling walker and min to mod A, verbal and tactile cueing for lateral weight shift to L side, increased R hip/knee flexion (reinforcement required throughout) for RLE clearance, and for RLE stance stability. Negotiated 3 stairs x2 trials (seated rest break required between) with step-to pattern and mod A. Pt performed initial trial forward-facing with bilat rails and verbal cueing for technique, sequencing. Subsequent trial performed laterally with bilat UE support at R rail with manual  stabilization of R knee during descent to prevent R genu varum/recurvatum. Returned to pt room, where pt was left seated on toilet with daughter present in room. Pt instructed to pull call bell tab in bathroom when finished. Nurse tech notified of pt location, need of assistance.  Therapy Documentation Precautions:  Precautions Precautions: Fall Precaution Comments: Pushes to R side.; midline disorientation Restrictions Weight Bearing Restrictions: No Vital Signs: Therapy Vitals Temp: 97.8 F (36.6 C) Temp Source: Oral Pulse Rate: 79 Resp: 17 BP: 150/58 mmHg Patient Position (if appropriate): Sitting Oxygen Therapy SpO2: 99 % O2 Device: None (Room air) Pain: Pain Assessment Pain Assessment: No/denies pain Locomotion : Ambulation Ambulation/Gait Assistance: 4: Min assist;3: Mod assist Wheelchair Mobility Distance: 150  Trunk/Postural Assessment : Postural Control Righting Reactions: With anterior and posterior LOB in standing, pt demonstrates intact ankle strategy but delayed/ineffective hip strategy and no stepping strategy.  Balance: Static Standing Balance Static Standing - Balance Support: Bilateral upper extremity supported Static Standing - Level of Assistance: 3: Mod assist;4: Min assist Static Standing - Comment/# of Minutes: Performed multiple trials of static standing on Airex foam with bilat UE support at rolling walker; frequent cueing required for pt awareness of postural stability due to frequent LOB posterior and to R side. Dynamic Standing Balance Dynamic Standing - Balance Support: Bilateral upper extremity supported;No upper extremity supported Dynamic Standing - Level of Assistance: 3: Mod assist;4: Min assist Dynamic Standing - Balance Activities: Ball toss Dynamic Standing - Comments: Tossed large ball initially while standing on Airex foam with bilat UE support at rolling walker between tosses; mod A to recover balance after catching/throwing ball. Due to  pt difficulty with activity, transitioned to ball  toss whle standing on noncompliant surface with intermittent bilat UE support and min guard-min A for stability/balance.  See FIM for current functional status  Therapy/Group: Individual Therapy and Co-Treatment  Hobble, Malva Cogan 10/13/2013, 4:41 PM

## 2013-10-13 NOTE — Progress Notes (Signed)
Occupational Therapy Session Note  Patient Details  Name: Candace Monroe MRN: [id number] Date of Birth: 1940/03/01  Today's Date: 10/13/2013 OT Individual Time: 1105-1155 OT Individual Time Calculation (min): 50 min    Short Term Goals: Week 2:  OT Short Term Goal 1 (Week 2): Pt will be able to walk with RW into bathroom to access toilet with min A. OT Short Term Goal 2 (Week 2): Pt will be able to stand with Rw while donning pants with min A. OT Short Term Goal 3 (Week 2): Pt will complete tub bench transfers with min A using RW. OT Short Term Goal 4 (Week 2): Pt will demonstrate improved R FMC to fasten her bra without A.   Skilled Therapeutic Interventions/Progress Updates:    Engaged in ADL retraining with focus on transfers, sit <> stand, dynamic sitting balance, and increased independence with self-care tasks of bathing and dressing.  W/c > tub bench transfer in room shower with min assist with heavy use of grab bars.  Pt with 1 LOB to Rt in sitting when doffing pants requiring max assist to correct.  Educated pt on alternative methods to fasten bra.  Pt stood at sink with UE support when pulling pants over hips, requiring steady assist to pull up pants.  In standing pt continues to require min assist due to posterior lean.  After completing grooming seated at sink, pt requested to return to bed due to fatigue.  Pt required mod assist stand pivot transfer to bed due to fatigue and required physical assist to position BLE into bed.  Therapy Documentation Precautions:  Precautions Precautions: Fall Precaution Comments: Pushes to R side.; midline disorientation Restrictions Weight Bearing Restrictions: No General: General OT Amount of Missed Time: 10 Minutes Pain: Pain Assessment Pain Assessment: No/denies pain  See FIM for current functional status  Therapy/Group: Individual Therapy  Simonne Come 10/13/2013, 12:10 PM

## 2013-10-13 NOTE — Progress Notes (Signed)
Physical Therapy Session Note  Patient Details  Name: Candace Monroe MRN: [id number] Date of Birth: 11/23/1940  Today's Date: 10/13/2013 PT Individual Time : 10:05-11:05 (66min)    Short Term Goals: Week 2:  PT Short Term Goal 1 (Week 2): Pt will perform basic transfers wtih S to R/L consistently with cues for technique PT Short Term Goal 2 (Week 2): Pt will perform gait training with Min A x50' with LRAD PT Short Term Goal 3 (Week 2): Pt will perform stairs x5 with 2 rails and Min A  Skilled Therapeutic Interventions/Progress Updates:  Tx focused on functional mobility training, gait training, and NMR via forced use, manual facilitation, and multi-modal cues. Daughter present, but leaving; encouraged to begin participating in all therapies to increase training.   Therapeutic activity Pt up in [facility], ready to go. Pt propelled WC 2x150' with S and cues for increasing stroke length. Pt tends to hug R wall, but no collisions.  Performed blocked transfers squat-pivot mat<>standard chair and WC<>mat. Provided demonstration and cues for technique. Pt continues to perform her own way unless provided with step-by-step cues. With encouragement on independence, pt can perform Min A with proper set-up. Self-selected technique, she needs Mod A to complete turn. Pt has limited internal error correction.  Encouraged pt to increase her independence with all mobility during this next week, and she was understanding.   NMR Instructed pt in serial sit<>stands with minimal/no UE use for increased RLE forced use.  Performed static and dynamic standing balance at RW to increase familiarity with device and LE control in double and SLS phases. Performed one-sided and reciprocal marching with cues for L weight shift and R extensor activation.  Instructed pt in seated NMR during rests for R marching, LAQ and DF 2x10 each.  NMR throughout static standing and gait for balance training, L-sided weight shifting, and manual  facilitation at R hip and knee.   Gait training with RW 2x70' with Mod A overall for balance; ACE wrap for DF assist. Pt needs step-by-step cues to reduce anterior momentum, and instead focus on lateral shifts, especially to L during R swing. Pt also needs constant cues for R extensor activation in stance on R. Pt needed solid Mod A during turns and retro stepping for transfers. Therapist allowed pt to safely loose balance throughout and self-recover rather than increase assistance.   Pt left up in [facility] with all needs in reach.        Therapy Documentation Precautions:  Precautions Precautions: Fall Precaution Comments: Pushes to R side.; midline disorientation Restrictions Weight Bearing Restrictions: No    Pain: none  See FIM for current functional status  Therapy/Group: Individual Therapy Kennieth Rad, PT, DPT  10/13/2013, 10:29 AM

## 2013-10-13 NOTE — Progress Notes (Signed)
Recreational Therapy Session Note  Patient Details  Name: Candace Monroe MRN: [id number] Date of Birth: 1940/08/13 Today's Date: 10/13/2013  Pain: no c/o Skilled Therapeutic Interventions/Progress Updates: Session focused on activity tolerance, dynamic standing balance, BUE use for ball toss on tile floor & then on foam mat with Min-mod assist.  Pt with LOB to right both posteriorly & anteriorly.    Therapy/Group: Co-Treatment  Momodou Consiglio 10/13/2013, 4:46 PM

## 2013-10-14 ENCOUNTER — Inpatient Hospital Stay (HOSPITAL_COMMUNITY): Payer: Medicare Other | Admitting: Speech Pathology

## 2013-10-14 ENCOUNTER — Inpatient Hospital Stay (HOSPITAL_COMMUNITY): Payer: Medicare Other | Admitting: *Deleted

## 2013-10-14 ENCOUNTER — Ambulatory Visit: Payer: Self-pay | Admitting: General Surgery

## 2013-10-14 ENCOUNTER — Inpatient Hospital Stay (HOSPITAL_COMMUNITY): Payer: Medicare Other

## 2013-10-14 ENCOUNTER — Inpatient Hospital Stay (HOSPITAL_COMMUNITY): Payer: Medicare Other | Admitting: Occupational Therapy

## 2013-10-14 DIAGNOSIS — G8190 Hemiplegia, unspecified affecting unspecified side: Secondary | ICD-10-CM

## 2013-10-14 DIAGNOSIS — M109 Gout, unspecified: Secondary | ICD-10-CM | POA: Diagnosis not present

## 2013-10-14 DIAGNOSIS — I10 Essential (primary) hypertension: Secondary | ICD-10-CM

## 2013-10-14 DIAGNOSIS — I6330 Cerebral infarction due to thrombosis of unspecified cerebral artery: Secondary | ICD-10-CM

## 2013-10-14 LAB — GLUCOSE, CAPILLARY
GLUCOSE-CAPILLARY: 95 mg/dL (ref 70–99)
GLUCOSE-CAPILLARY: 105 mg/dL — AB (ref 70–99)
Glucose-Capillary: 109 mg/dL — ABNORMAL HIGH (ref 70–99)
Glucose-Capillary: 154 mg/dL — ABNORMAL HIGH (ref 70–99)

## 2013-10-14 MED ORDER — AMLODIPINE BESYLATE 5 MG PO TABS
5.0000 mg | ORAL_TABLET | Freq: Every day | ORAL | Status: DC
  Administered 2013-10-14 – 2013-10-17 (×4): 5 mg via ORAL
  Filled 2013-10-14 (×4): qty 1

## 2013-10-14 MED ORDER — SENNOSIDES-DOCUSATE SODIUM 8.6-50 MG PO TABS
2.0000 | ORAL_TABLET | Freq: Every day | ORAL | Status: DC
  Administered 2013-10-14 – 2013-10-19 (×5): 2 via ORAL
  Filled 2013-10-14 (×6): qty 2

## 2013-10-14 MED ORDER — AMLODIPINE BESYLATE 5 MG PO TABS
5.0000 mg | ORAL_TABLET | Freq: Once | ORAL | Status: AC
  Administered 2013-10-14: 5 mg via ORAL
  Filled 2013-10-14: qty 1

## 2013-10-14 NOTE — Progress Notes (Signed)
Speech Language Pathology Daily Session Note  Patient Details  Name: Candace Monroe MRN: [id number] Date of Birth: [birth year]  Today's Date: 10/14/2013 SLP Individual Time: 1005-1035 SLP Individual Time Calculation (min): 30 min  Short Term Goals: Week 2: SLP Short Term Goal 1 (Week 2): Pt will label 2 physical and 1 cognitive change post CVA with Min question cues  SLP Short Term Goal 2 (Week 2): Pt will request help as needed with Min question cues SLP Short Term Goal 3 (Week 2): Pt will demonstrate complex problem solving with supervision assist to self-monitor and correct errors SLP Short Term Goal 4 (Week 2): Pt will utilize external aids to assist with recal of new information with supervision.  Skilled Therapeutic Interventions:  Pt was seen for skilled speech therapy targeting cognitive goals.  Upon arrival, pt was seated upright in wheelchair, awake, alert,and agreeable to participate in [place].  SLP facilitated the session with a semi-complex functional problem solving task targeting organization.  Pt completed the abovementioned task with initial mod assist instructional cues which SLP faded to supervision as pt became more familiar with task.   SLP will follow for task expansion activity at next available appointment to target executive function skills for planning and summarizing information.  Pt made aware of plan to carry over task across 2 therapy sessions and SLP encouraged pt to use compensatory strategies for memory to facilitate improved delayed recall of information between therapy sessions.  Continue per current plan of care.    FIM:  Comprehension Comprehension Mode: Auditory Comprehension: 5-Follows basic conversation/direction: With extra time/assistive device Expression Expression Mode: Verbal Expression: 5-Expresses basic needs/ideas: With no assist Social Interaction Social Interaction: 5-Interacts appropriately 90% of the time - Needs monitoring or encouragement for  participation or interaction. Problem Solving Problem Solving: 4-Solves basic 75 - 89% of the time/requires cueing 10 - 24% of the time Memory Memory: 2-Recognizes or recalls 25 - 49% of the time/requires cueing 51 - 75% of the time  Pain Pain Assessment Pain Assessment: No/denies pain  Therapy/Group: Individual Therapy  Windell Moulding, M.A. CCC-SLP  Taheerah Guldin, Selinda Orion 10/14/2013, 1:08 PM

## 2013-10-14 NOTE — Progress Notes (Signed)
Occupational Therapy Session Note  Patient Details  Name: Candace Monroe MRN: [id number] Date of Birth: 04/17/1940  Today's Date: 10/14/2013 OT Individual Time: [id number] OT Individual Time Calculation (min): 60 min    Short Term Goals: Week 2:  OT Short Term Goal 1 (Week 2): Pt will be able to walk with RW into bathroom to access toilet with min A. OT Short Term Goal 2 (Week 2): Pt will be able to stand with Rw while donning pants with min A. OT Short Term Goal 3 (Week 2): Pt will complete tub bench transfers with min A using RW. OT Short Term Goal 4 (Week 2): Pt will demonstrate improved R FMC to fasten her bra without A.   Skilled Therapeutic Interventions/Progress Updates:  Pt supine in bed upon entering the room with daughter present. Pt with no c/o pain this session. OT session with focus on ADL retraining, safety awareness, insights into deficits - goal progress education, patient and family education, STS, funcational transfers. Pt transferred Mod A stand pivot from bed to w/c. Pt propelled self into bathroom with assistance to set up wheelchair for transfers. W/c <> TTB with Mod A and pt relying heavily on grab bar. Once bathing, pt required steady assist for balance and safety while seated on EOB. Pt with 1 LOB towards R side while sitting with Max A from therapist to correct. Dressing performed seated in wheelchair at sink side. STS x 4 reps with Mod A to maintain bathing during clothing management. Pt demonstrating the ability to lock breaks independently on wheelchair for safety but unable to verbalize deficits. Pt stating only issues , "My walking". OT discussed situations that occurred during session and goal working towards deficits with pt. Pt verbalizing understanding. Pt seated in wheelchair with call bell and all other needed items within reach upon exiting the room. Daughter remains in room.   Therapy Documentation Precautions:  Precautions Precautions: Fall Precaution  Comments: Pushes to R side.; midline disorientation Restrictions Weight Bearing Restrictions: No Pain: Pain Assessment Pain Assessment: No/denies pain  See FIM for current functional status  Therapy/Group: Individual Therapy  Phineas Semen 10/14/2013, 11:40 AM

## 2013-10-14 NOTE — Patient Care Conference (Signed)
Inpatient RehabilitationTeam Conference and Plan of Care Update Date: 10/14/2013   Time: 10;30 AM    Patient Name: Candace Monroe      Medical Record Number: [id number]  Date of Birth: 1940-09-07 Sex: Female         Room/Bed: 4W24C/4W24C-01 Payor Info: Payor: [place] / Plan: AARP MEDICARE COMPLETE / Product Type: *No Product type* /    Admitting Diagnosis: l cva  Admit Date/Time:  10/02/2013  4:22 PM Admission Comments: No comment available   Primary Diagnosis:  CVA (cerebral infarction) Principal Problem: CVA (cerebral infarction)  Patient Active Problem List   Diagnosis Date Noted  . Gout flare 10/14/2013  . HTN (hypertension) 10/07/2013  . Depression 10/07/2013  . Spastic hemiparesis affecting dominant side 10/06/2013  . CVA (cerebral infarction) 10/02/2013    Expected Discharge Date: Expected Discharge Date: 10/21/13  Team Members Present: Physician leading conference: Dr. Alysia Penna Social Worker Present: Ovidio Kin, LCSW Nurse Present: Heather Roberts, RN PT Present: Raylene Everts, PT;Caroline Lacinda Axon, PT;Blair Hobble, PT OT Present: Simonne Come, OT SLP Present: Windell Moulding, SLP PPS Coordinator present : Ileana Ladd, PT     Current Status/Progress Goal Weekly Team Focus  Medical   intermittent foot pain  maintain med stab for home d/c  improve R sided awareness   Bowel/Bladder   remains on schedule toilenting program  remains conient of bowel and bladder  remain on scheduled toilening program to remain conienet of bowel and bladder   Swallow/Nutrition/ Hydration     [facility]        ADL's   min/steady assist bathing, dressing, walk-in shower transfer stand pivot  min standing balance, min bathing, set up UB dressing, min LB dressing, min toileting and toilet transfers, min tub bench transfers  ADL retraining, RUE NMR, postural control, balance, cognition, family education   Mobility   min transfers, min bed mobility, w/c x 150' with S/min assist,  gait with RW x 90' with min/mod assist  S transfers and bed mobility, Min A gait x25' and 4 steps, S WC x50'  standing balance, gait, transfers, family ed, awareness   Communication     [facility]        Safety/Cognition/ Behavioral Observations  min assist-supervision   supervision   awareness, memory, semi-complex problem solving    Pain   Denies pain  Patient will remain less than 5  Reasses pain daily   Skin   Skiin tear to left arm  Thin skin protected to Aliign dressing  Daily skin check to protect skin breakdown      *See Care Plan and progress notes for long and short-term goals.  Barriers to Discharge: inconsitent family training, unable to get up steps at home    Possible Resolutions to Barriers:  needs railing    Discharge Planning/Teaching Needs:  Home with family providing 24 hr care-daughter stays here with her.      Team Discussion:  Timed tolieting seems to be working.  Hydrating better-adjusting meds for creatine-foot pain better.  Will need two rails to get up the steps at home-husband to install.  Need to begin family training with husband and daughter. AFO ordered.  Poor awareness of right side and somewhat impulsive  Revisions to Treatment Plan:  None   Continued Need for Acute Rehabilitation Level of Care: The patient requires daily medical management by a physician with specialized training in physical medicine and rehabilitation for the following conditions: Daily direction of a multidisciplinary physical rehabilitation program  to ensure safe treatment while eliciting the highest outcome that is of practical value to the patient.: Yes Daily medical management of patient stability for increased activity during participation in an intensive rehabilitation regime.: Yes Daily analysis of laboratory values and/or radiology reports with any subsequent need for medication adjustment of medical intervention for : Neurological problems;Other  DupreeGardiner Rhyme 10/15/2013,  9:14 AM

## 2013-10-14 NOTE — Progress Notes (Signed)
Social Work Patient ID: Candace Monroe, female   DOB: [birth year], 73 y.o.   MRN: [id number] Met with pt, husband and their daughter to discuss team conference progression toward goals and discharge 10/21.  Team recommends two rails for their steps at home. Husband to talk with son's and get started on this.  Asked both to begin attending therapies with pt, to begin learning her care.  Both agreed to this and will instead of leaving when it begins. Aware of her need for 24 hr care due to lack of awareness and impulsivity. Work toward discharge next week.

## 2013-10-14 NOTE — Progress Notes (Signed)
[address] PHYSICAL MEDICINE & REHABILITATION     PROGRESS NOTE 73 year old female with history of HTN, diabetes (off medications), right breast mass, ongoing tobacco use who was admitted to [facility] on 09/27/13 with right sided weakness. Blood pressure 240/100 at admission. CT head without acute changes. MRI brain done revealing acute lacunar infarct left frontal lobe peri-Rolandic white matter, moderate to severe underlying chronic small vessel disease. 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 Cardiac echo with EF 50-60% with no wall abnormality, trace MVR. She was evaluated by Dr. Irish Elders who recommended full strength ASA for thrombotic stroke due to uncontrolled HTN, tobacco use and diabetes    Subjective/Complaints: Constipated requiring disimpaction Discussed BMET results  A  review of systems has been performed and if not noted above is otherwise negative.   Objective: Vital Signs: Blood pressure 182/70, pulse 65, temperature 98.7 F (37.1 C), temperature source Oral, resp. rate 18, height 5' (1.524 m), weight 55.838 kg (123 lb 1.6 oz), SpO2 95.00%. No results found. No results found for this basename: WBC, HGB, HCT, PLT,  in the last 72 hours  Recent Labs  10/11/13 0930 10/12/13 0833  NA 137 139  K 4.0 4.4  CL 101 102  GLUCOSE 173* 142*  BUN 39* 38*  CREATININE 1.47* 1.44*  CALCIUM 9.2 9.1   CBG (last 3)   Recent Labs  10/13/13 1635 10/13/13 2104 10/14/13 0717  GLUCAP 159* 91 109*    Wt Readings from Last 3 Encounters:  10/07/13 55.838 kg (123 lb 1.6 oz)  10/02/13 57.607 kg (127 lb)    Physical Exam:  Nursing note and vitals reviewed.  Constitutional: She is oriented to person, place, and time. She appears well-developed and well-nourished.   HENT: Oral mucosa pink and moist  Head: Normocephalic and atraumatic.  Eyes: Conjunctivae are normal. Pupils are equal, round, and reactive to light.  Neck: Normal  range of motion. Neck supple.  Cardiovascular: Normal rate and regular rhythm.  Respiratory: Effort normal. . She has no rales. She exhibits no tenderness.  GI: Soft. Bowel sounds are normal. She exhibits no distension. There is no tenderness.  Musculoskeletal: She exhibits no edema and no tenderness.  Neurological: She is alert and oriented to person, place, and time. Sensory intake Speech clear. Able to follow two step commands without difficulty. Right hemiparesis--deltoid 3+/5, biceps/triceps 4/5, intrinsics 4/5, HF 4-/5, KE 4-/5, PF 3-/5, DF 2-. Skin: Skin is warm and dry. On ext, no evidence of stasis dermatitis or heel breakdown Psychiatric: She has a normal mood and affect. Her behavior is normal. Judgment and thought content normal   Assessment/Plan: 1. Functional deficits secondary to left frontal infarct which require 3+ hours per day of interdisciplinary therapy in a comprehensive inpatient rehab setting. Physiatrist is providing close team supervision and 24 hour management of active medical problems listed below. Physiatrist and rehab team continue to assess barriers to discharge/monitor patient progress toward functional and medical goals.  FIM: FIM - Bathing Bathing Steps Patient Completed: Chest;Right Arm;Left Arm;Abdomen;Front perineal area;Buttocks;Right upper leg;Left upper leg;Right lower leg (including foot);Left lower leg (including foot) Bathing: 4: Steadying assist  FIM - Upper Body Dressing/Undressing Upper body dressing/undressing steps patient completed: Thread/unthread right sleeve of pullover shirt/dresss;Thread/unthread left sleeve of pullover shirt/dress;Put head through opening of pull over shirt/dress;Pull shirt over trunk;Thread/unthread left bra strap;Thread/unthread right bra strap Upper body dressing/undressing: 4: Min-Patient completed 75 plus % of tasks FIM - Lower Body Dressing/Undressing Lower  body dressing/undressing steps patient completed:  Thread/unthread right underwear leg;Thread/unthread left underwear leg;Thread/unthread right pants leg;Thread/unthread left pants leg;Pull pants up/down;Don/Doff left sock;Don/Doff right sock;Pull underwear up/down Lower body dressing/undressing: 4: Steadying Assist  FIM - Toileting Toileting steps completed by patient: Performs perineal hygiene Toileting Assistive Devices: Grab bar or rail for support Toileting: 2: Max-Patient completed 1 of 3 steps  FIM - Radio producer Devices: Product manager Transfers: 3-To toilet/BSC: Mod A (lift or lower assist)  FIM - Control and instrumentation engineer Devices: Arm rests Bed/Chair Transfer: 4: Bed > Chair or W/C: Min A (steadying Pt. > 75%);4: Chair or W/C > Bed: Min A (steadying Pt. > 75%)  FIM - Locomotion: Wheelchair Distance: 150 Locomotion: Wheelchair: 5: Travels 150 ft or more: maneuvers on rugs and over door sills with supervision, cueing or coaxing FIM - Locomotion: Ambulation Locomotion: Ambulation Assistive Devices: Walker - Rolling;Other (comment) (ACE bandage for R ankle dorsiflexion) Ambulation/Gait Assistance: 4: Min assist;3: Mod assist Locomotion: Ambulation: 3: Travels 150 ft or more with moderate assistance (Pt: 50 - 74%)  Comprehension Comprehension Mode: Auditory Comprehension: 5-Follows basic conversation/direction: With no assist  Expression Expression Mode: Verbal Expression: 5-Expresses basic needs/ideas: With no assist  Social Interaction Social Interaction Mode: Asleep Social Interaction: 5-Interacts appropriately 90% of the time - Needs monitoring or encouragement for participation or interaction.  Problem Solving Problem Solving Mode: Asleep Problem Solving: 4-Solves basic 75 - 89% of the time/requires cueing 10 - 24% of the time  Memory Memory Mode: Asleep Memory: 4-Recognizes or recalls 75 - 89% of the time/requires cueing 10 - 24% of the time  Medical Problem  List and Plan:  1. 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 DVT Prophylaxis/Anticoagulation: Pharmaceutical: Lovenox  3. Pain Management: N/A  4. Depression/Mood: continue lexapro  5. Neuropsych: This patient is capable of making decisions on her own behalf.  6. Skin/Wound Care: Routine pressure relief measures , 7. Fluids/Electrolytes/Nutrition: Monitor I/O. Offer supplements if intake poor. GFR lower will stop ACE, add CCB 8. HTN: Blood pressures trending upward and medications resumed today. Permissive hypertension to allow perfusion. Will monitor every 8 hours and titrate medication to home dose.  9. Diabetes: No medications for past 1-2 years--reports she lost a lot of weight and last hgb A1c around 5. Will check hgb A1c. Fasting BS 108-135 range.  -dietary ed  10. Dyslipidemia: Continue lipitor.  11.  Reflux symptoms appear to be improving on Pepcid    LOS (Days) 12 A FACE TO FACE EVALUATION WAS PERFORMED  Alysia Penna E 10/14/2013 8:56 AM

## 2013-10-14 NOTE — Progress Notes (Signed)
Recreational Therapy Session Note  Patient Details  Name: Candace Monroe MRN: [id number] Date of Birth: 07-19-1940 Today's Date: 10/14/2013  Pain: no c/o Skilled Therapeutic Interventions/Progress Updates: Session focused on activity tolerance, dynamic standing balance, R knee control & extension.  Pt stood leaning R hip against raised mat while kicking a ball or reaching for horseshoes outside BOS to facilitate RLE extension & force WBing through RLE with +2 assist & max cues.  Therapy/Group: Co-Treatment   Najla Aughenbaugh 10/14/2013, 3:54 PM

## 2013-10-14 NOTE — Progress Notes (Signed)
Physical Therapy Session Note  Patient Details  Name: Candace Monroe MRN: [id number] Date of Birth: 1940-10-19  Today's Date: 10/14/2013 PT Individual Time: 0905-1005; [id number] PT Individual Time Calculation (min): 60 min , 35 min  Short Term Goals: Week 2:  PT Short Term Goal 1 (Week 2): Pt will perform basic transfers wtih S to R/L consistently with cues for technique PT Short Term Goal 2 (Week 2): Pt will perform gait training with Min A x50' with LRAD PT Short Term Goal 3 (Week 2): Pt will perform stairs x5 with 2 rails and Min A  Skilled Therapeutic Interventions/Progress Updates:  tx 1: 10 minutes make up time for missed minutes  First 30 min co-tx with Rec.Therapist for skilled +2 standing balance activities/neuromuscular re-education.  Standing with R side leaning against mat, ACE on R foot for foot drop, kicking ball with L foot for forced use of RLE for stance stability.  Pt had inconsistent R knee ext, minimal hip extensor activation, with R hip remaining retracted. Also, reaching with L hand out of BOS for objects and replacing them across midline to R. Sit>< stand with bias toward R and to bil LEs, without use of UEs; mini squats in standing with +2 assist, no UEs.  2nd 30 min- neuromuscular re-education via forced use, tactile and VCs for NuStep x 8 min with bil Ues and bil LEs and x 2 minutes with LEs only to bias toward LEs extension. Gait with Rw, ACE R foot, x 55' with mod assist/max cues for R foot placement, upright trunk, wt shifting R hip activation.  Pt has fast gait, PTA per pt, and balance is tenuous due to incomplete wt shifts/inattention with R toes catching floor at times. Up/down 5 stairs 2 rails with min assist with 2 cue for sequencing and bil hand placement/mod assist briefly when descending for wt shifting as pt initially attempted to descend leading with R foot.  Stand pivot transfer w/c>< NuStep with min guard assist to R and L, mod cues.  Tx  2: neuromuscular re-education via forced use, mirror feedback, tactile cues for midline orientation, R stance stability on Kinetron in sitting> standing focusing on R stance stability and trunk righting.  Pt consistently over shifts R and does not respond with R trunk shortening. Gait in hallway using L rail, no ACE on R foot, mod assist x 20' forward and backward, focusing on clearance of R foot, wt shifting, R stance stability with upright trunk.    Brooke Pace, CO arrived at end of session.  Toe Off R AFO should be in tomorrow; he will return then with it.     Therapy Documentation Precautions:  Precautions Precautions: Fall Precaution Comments: Pushes to R side.; midline disorientation Restrictions Weight Bearing Restrictions: No   Pain: Pain Assessment Pain Assessment: No/denies pain   Locomotion : Ambulation Ambulation/Gait Assistance: 4: Min assist;3: Mod assist     See FIM for current functional status  Therapy/Group: Individual Therapy and Co-Treatment  Trystian Crisanto 10/14/2013, 11:59 AM

## 2013-10-15 ENCOUNTER — Inpatient Hospital Stay (HOSPITAL_COMMUNITY): Payer: Medicare Other | Admitting: Occupational Therapy

## 2013-10-15 ENCOUNTER — Inpatient Hospital Stay (HOSPITAL_COMMUNITY): Payer: Medicare Other

## 2013-10-15 LAB — BASIC METABOLIC PANEL
Anion gap: 13 (ref 5–15)
BUN: 31 mg/dL — ABNORMAL HIGH (ref 6–23)
CO2: 21 meq/L (ref 19–32)
Calcium: 8.9 mg/dL (ref 8.4–10.5)
Chloride: 105 mEq/L (ref 96–112)
Creatinine, Ser: 1.25 mg/dL — ABNORMAL HIGH (ref 0.50–1.10)
GFR calc Af Amer: 48 mL/min — ABNORMAL LOW (ref >90)
GFR, EST NON AFRICAN AMERICAN: 41 mL/min — AB (ref >90)
Glucose, Bld: 115 mg/dL — ABNORMAL HIGH (ref 70–99)
POTASSIUM: 4.3 meq/L (ref 3.7–5.3)
Sodium: 139 mEq/L (ref 137–147)

## 2013-10-15 LAB — GLUCOSE, CAPILLARY
GLUCOSE-CAPILLARY: 157 mg/dL — AB (ref 70–99)
Glucose-Capillary: 134 mg/dL — ABNORMAL HIGH (ref 70–99)
Glucose-Capillary: 120 mg/dL — ABNORMAL HIGH (ref 70–99)
Glucose-Capillary: 96 mg/dL (ref 70–99)

## 2013-10-15 NOTE — Progress Notes (Signed)
Physical Therapy Session Note  Patient Details  Name: Candace Monroe MRN: [id number] Date of Birth: April 10, 1940  Today's Date: 10/15/2013 PT Individual Time: 0800-0905; 1400-1500 PT Individual Time Calculation (min): 65 min , 60 min  Short Term Goals: Week 2:  PT Short Term Goal 1 (Week 2): Pt will perform basic transfers wtih S to R/L consistently with cues for technique PT Short Term Goal 2 (Week 2): Pt will perform gait training with Min A x50' with LRAD PT Short Term Goal 3 (Week 2): Pt will perform stairs x5 with 2 rails and Min A  Skilled Therapeutic Interventions/Progress Updates:  Tx 1: Bed mobility with extra time, modified independent with minimal use of rail.  Bed> w/c stand pivot to R with min assist for controlled descent.  Pt tends to sit "early".  neuromuscular re-education via forced use, VCs, tactile cues, mirror feedback for Kinetron at 20 cm/sec in sitting > standing with bil Ues > without bil UEs; tall kneeling for terminal hip extension 2 x 10, and reaching R hand.  Gait with RW, ACE R foot, x 60' including turns to L and R, with mod assist for wt shifting, upright trunk, step lengths, R foot clearance.  Up/down 5 steps 2 rails, with min guard assist except for max assist once when ascending 7" high step, when she had difficulty wt shifting fully to L foot and extending knee and hip. Pt needed max cues for sequencing on steps today.  Tx 2:  Brooke Pace, CO delivered size small R Toe Off AFO, and fitted it to pt.   Family ed with daughter [name] for simulated car transfer, basic transfer, bed mobility, gait with RW, pusher tendencies, donning/doffing AFO, HEP of bil bridging, R unilateral bridging, bil lower trunk rotation. Discussed whether or not pt would need a rental w/c at d/c; currently not recommended by PT.  [name] needs more practice with supine> sit, car transfer, gait, and needs to start training on stairs.  Basic and car transfers with min assist.  Gait with RW, R  Toe Off AFO x 50' x 2 on level tile and carpet with min/mod assist.  Narrow BOS/overshifting to R with difficulty clearing R foot remain her greatest safety concerns during gait. Brooke Pace may need to place a leather toe cap on R shoe; TBD closer to d/c.  neuromuscular re-education via set-up, manual cues for bil lower trunk rotation, bil bridging and R unilateral bridging x 10 each with bil UEs on chest to bias effort in trunk and LEs.  On regular bed, sit> supine modified independent.  Supine> sit through L sidelying x 2 with cues for RLE, safety as she was very close to edge of bed.     Therapy Documentation Precautions:  Precautions Precautions: Fall Precaution Comments: Pushes to R side.; midline disorientation Restrictions Weight Bearing Restrictions: No       See FIM for current functional status  Therapy/Group: Individual Therapy  Minaal Struckman 10/15/2013, 12:35 PM

## 2013-10-15 NOTE — Progress Notes (Signed)
Occupational Therapy Session Note  Patient Details  Name: Candace Monroe MRN: [id number] Date of Birth: Dec 22, 1940  Today's Date: 10/15/2013 OT Individual Time: 0930-1030 OT Individual Time Calculation (min): 60 min    Short Term Goals: Week 2:  OT Short Term Goal 1 (Week 2): Pt will be able to walk with RW into bathroom to access toilet with min A. OT Short Term Goal 2 (Week 2): Pt will be able to stand with Rw while donning pants with min A. OT Short Term Goal 3 (Week 2): Pt will complete tub bench transfers with min A using RW. OT Short Term Goal 4 (Week 2): Pt will demonstrate improved R FMC to fasten her bra without A.   Skilled Therapeutic Interventions/Progress Updates:    Engaged in ADL retraining with focus on family education with daughter and functional transfers with RW.  Upon arrival, pt's daughter pushing pt out of bathroom reporting having just transfered pt to toilet.  Engaged in education regarding safety with transfers and had daughter and pt demonstrate transfer technique w/c <> toilet, pt utilizing grab bars requiring min/steady assist at hips.  Pt's daughter reports comfortable with this transfer, however home bathroom is not w/c accessible.  Engaged in toilet transfer with RW, pt required mod assist secondary to decreased RLE advancement with ambulation increasing difficulty when turning to complete transfer, do not recommend daughter transfer pt with RW at this time.  Discussed tub/shower transfer and need for tub transfer bench upon d/c.   Engaged in bathing at sit > stand level in room shower.  W/c > tub bench transfer in room shower with min assist with heavy use of grab bars. Pt with bra that zips in front, attempted to zip prior to donning with pt unable to pull bra down in back therefore attempted to zip closure after donning with pt unable to setup zipper and pull zipper close.  Recommend attempting regular bar with fastening in front prior to donning.  Pt stood at  sink with UE support when pulling pants over hips, requiring steady assist to pull up pants. In standing pt continues to require min assist due to posterior lean. After completing grooming seated at sink, pt requested to return to bed due to fatigue. Pt required mod assist when turning with RW and mod verbal cues for step length.  Therapy Documentation Precautions:  Precautions Precautions: Fall Precaution Comments: Pushes to R side.; midline disorientation Restrictions Weight Bearing Restrictions: No Pain: Pain Assessment Pain Assessment: No/denies pain  See FIM for current functional status  Therapy/Group: Individual Therapy  Simonne Come 10/15/2013, 10:55 AM

## 2013-10-15 NOTE — Progress Notes (Signed)
Candace Monroe PHYSICAL MEDICINE & REHABILITATION     PROGRESS NOTE 73 year old female with history of HTN, diabetes (off medications), right breast mass, ongoing tobacco use who was admitted to [facility] on 09/27/13 with right sided weakness. Blood pressure 240/100 at admission. CT head without acute changes. MRI brain done revealing acute lacunar infarct left frontal lobe peri-Rolandic white matter, moderate to severe underlying chronic small vessel disease. 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 Cardiac echo with EF 50-60% with no wall abnormality, trace MVR. She was evaluated by Dr. Irish Elders who recommended full strength ASA for thrombotic stroke due to uncontrolled HTN, tobacco use and diabetes    Subjective/Complaints: No pain c/os breathing ok Slept ok   A  review of systems has been performed and if not noted above is otherwise negative.   Objective: Vital Signs: Blood pressure 149/81, pulse 78, temperature 98.8 F (37.1 C), temperature source Oral, resp. rate 17, height 5' (1.524 m), weight 55.248 kg (121 lb 12.8 oz), SpO2 99.00%. No results found. No results found for this basename: WBC, HGB, HCT, PLT,  in the last 72 hours  Recent Labs  10/15/13 0615  NA 139  K 4.3  CL 105  GLUCOSE 115*  BUN 31*  CREATININE 1.25*  CALCIUM 8.9   CBG (last 3)   Recent Labs  10/14/13 1637 10/14/13 2120 10/15/13 0730  GLUCAP 95 105* 134*    Wt Readings from Last 3 Encounters:  10/14/13 55.248 kg (121 lb 12.8 oz)  10/02/13 57.607 kg (127 lb)    Physical Exam:  Nursing note and vitals reviewed.  Constitutional: She is oriented to person, place, and time. She appears well-developed and well-nourished.   HENT: Oral mucosa pink and moist  Head: Normocephalic and atraumatic.  Eyes: Conjunctivae are normal. Pupils are equal, round, and reactive to light.  Neck: Normal range of motion. Neck supple.  Cardiovascular: Normal rate and  regular rhythm.  Respiratory: Effort normal. . She has no rales. She exhibits no tenderness.  GI: Soft. Bowel sounds are normal. She exhibits no distension. There is no tenderness.  Musculoskeletal: She exhibits no edema and no tenderness.  Neurological: She is alert and oriented to person, place, and time. Sensory intake Speech clear. Able to follow two step commands without difficulty. Right hemiparesis--deltoid 4/5, biceps/triceps 4/5, intrinsics 4/5, HF 4-/5, KE 4-/5, PF 3-/5, DF 2-. Skin: Skin is warm and dry. On ext, no evidence of stasis dermatitis or heel breakdown Psychiatric: She has a normal mood and affect. Her behavior is normal. Judgment and thought content normal   Assessment/Plan: 1. Functional deficits secondary to left frontal infarct which require 3+ hours per day of interdisciplinary therapy in a comprehensive inpatient rehab setting. Physiatrist is providing close team supervision and 24 hour management of active medical problems listed below. Physiatrist and rehab team continue to assess barriers to discharge/monitor patient progress toward functional and medical goals.  FIM: FIM - Bathing Bathing Steps Patient Completed: Chest;Right Arm;Left Arm;Abdomen;Front perineal area;Buttocks;Right upper leg;Left upper leg;Right lower leg (including foot);Left lower leg (including foot) Bathing: 4: Steadying assist  FIM - Upper Body Dressing/Undressing Upper body dressing/undressing steps patient completed: Thread/unthread right sleeve of pullover shirt/dresss;Thread/unthread left sleeve of pullover shirt/dress;Put head through opening of pull over shirt/dress;Pull shirt over trunk;Thread/unthread right bra strap;Thread/unthread left bra strap Upper body dressing/undressing: 4: Min-Patient completed 75 plus % of tasks FIM - Lower Body Dressing/Undressing Lower body dressing/undressing steps patient completed: Thread/unthread right  underwear leg;Thread/unthread left underwear  leg;Thread/unthread right pants leg;Thread/unthread left pants leg;Pull pants up/down;Don/Doff left sock;Don/Doff right sock;Pull underwear up/down Lower body dressing/undressing: 4: Steadying Assist  FIM - Toileting Toileting steps completed by patient: Adjust clothing prior to toileting;Performs perineal hygiene;Adjust clothing after toileting Toileting Assistive Devices: Grab bar or rail for support Toileting: 4: Steadying assist  FIM - Radio producer Devices: Grab bars Toilet Transfers: 3-To toilet/BSC: Mod A (lift or lower assist);3-From toilet/BSC: Mod A (lift or lower assist)  FIM - Bed/Chair Transfer Bed/Chair Transfer Assistive Devices: Arm rests Bed/Chair Transfer: 0: Activity did not occur  FIM - Locomotion: Wheelchair Distance: 150 Locomotion: Wheelchair: 1: Travels less than 50 ft with supervision, cueing or coaxing FIM - Locomotion: Ambulation Locomotion: Ambulation Assistive Devices: Walker - Rolling;Other (comment) Ambulation/Gait Assistance: 4: Min assist;3: Mod assist Locomotion: Ambulation: 2: Travels 50 - 149 ft with moderate assistance (Pt: 50 - 74%)  Comprehension Comprehension Mode: Auditory Comprehension: 5-Follows basic conversation/direction: With no assist  Expression Expression Mode: Verbal Expression: 5-Expresses basic needs/ideas: With no assist  Social Interaction Social Interaction Mode: Asleep Social Interaction: 5-Interacts appropriately 90% of the time - Needs monitoring or encouragement for participation or interaction.  Problem Solving Problem Solving Mode: Asleep Problem Solving: 4-Solves basic 75 - 89% of the time/requires cueing 10 - 24% of the time  Memory Memory Mode: Asleep Memory: 2-Recognizes or recalls 25 - 49% of the time/requires cueing 51 - 75% of the time  Medical Problem List and Plan:  1. 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 DVT  Prophylaxis/Anticoagulation: Pharmaceutical: Lovenox  3. Pain Management: N/A  4. Depression/Mood: continue lexapro  5. Neuropsych: This patient is capable of making decisions on her own behalf.  6. Skin/Wound Care: Routine pressure relief measures , 7. Fluids/Electrolytes/Nutrition: Monitor I/O. Offer supplements if intake poor. GFR lower will stop ACE, add CCB 8. HTN: Blood pressures trending upward and medications resumed today. Permissive hypertension to allow perfusion. Will monitor every 8 hours and titrate medication to home dose.  9. Diabetes: No medications for past 1-2 years--reports she lost a lot of weight and last hgb A1c around 5. Will check hgb A1c. Fasting BS 108-135 range.  -dietary ed  10. Dyslipidemia: Continue lipitor.  11.  Reflux symptoms appear to be improving on Pepcid    LOS (Days) 13 A FACE TO FACE EVALUATION WAS PERFORMED  Candace Monroe 10/15/2013 9:16 AM

## 2013-10-16 ENCOUNTER — Inpatient Hospital Stay (HOSPITAL_COMMUNITY): Payer: Medicare Other | Admitting: Occupational Therapy

## 2013-10-16 ENCOUNTER — Inpatient Hospital Stay (HOSPITAL_COMMUNITY): Payer: Medicare Other | Admitting: Speech Pathology

## 2013-10-16 ENCOUNTER — Inpatient Hospital Stay (HOSPITAL_COMMUNITY): Payer: Medicare Other | Admitting: *Deleted

## 2013-10-16 DIAGNOSIS — N289 Disorder of kidney and ureter, unspecified: Secondary | ICD-10-CM

## 2013-10-16 LAB — GLUCOSE, CAPILLARY
GLUCOSE-CAPILLARY: 124 mg/dL — AB (ref 70–99)
Glucose-Capillary: 98 mg/dL (ref 70–99)
Glucose-Capillary: 119 mg/dL — ABNORMAL HIGH (ref 70–99)
Glucose-Capillary: 84 mg/dL (ref 70–99)

## 2013-10-16 NOTE — Progress Notes (Signed)
Occupational Therapy Weekly Progress Note  Patient Details  Name: Candace Monroe MRN: [id number] Date of Birth: 08-Jul-1940  Beginning of progress report period: October 09, 2013 End of progress report period: October 16, 2013  Today's Date: 10/16/2013 OT Individual Time: 1100-1200 and 1330-1400 OT Individual Time Calculation (min): 60 min and 30 min   Patient has met 4 of 4 short term goals.  Pt is making excellent progress towards goals.  Her sitting and standing balance have improved to min guard - close supervision during dynamic sitting and standing activities.  Pt has demonstrated improved [place] with ability to fasten zip front bra and don Rt shoe with AFO and fasten AFO.  Pt's daughter has begun hands on family education with transfers at w/c level and have begun education with use of RW.  Pt and family will require additional education on transfers and self-care tasks as pt's bathroom is not w/c accessible.   Patient continues to demonstrate the following deficits: decreased postural awareness, trunk control, Rt sided weakness, RLE weakness and decreased control with mobility and transfers and therefore will continue to benefit from skilled OT intervention to enhance overall performance with BADL, iADL and Reduce care partner burden.  Patient progressing toward long term goals..  Plan of care revisions: upgraded bathing and dressing tasks to supervision, transfers still at min assist.  OT Short Term Goals Week 2:  OT Short Term Goal 1 (Week 2): Pt will be able to walk with RW into bathroom to access toilet with min A. OT Short Term Goal 1 - Progress (Week 2): Met OT Short Term Goal 2 (Week 2): Pt will be able to stand with Rw while donning pants with min A. OT Short Term Goal 2 - Progress (Week 2): Met OT Short Term Goal 3 (Week 2): Pt will complete tub bench transfers with min A using RW. OT Short Term Goal 3 - Progress (Week 2): Met OT Short Term Goal 4 (Week 2): Pt will demonstrate  improved R FMC to fasten her bra without A.  OT Short Term Goal 4 - Progress (Week 2): Met Week 3:  OT Short Term Goal 1 (Week 3): STG = LTGs due to remaining LOS  Skilled Therapeutic Interventions/Progress Updates:    1) Engaged in ADL retraining with focus on functional mobility and transfers with RW and increased safety and independence with self-care tasks of bathing and dressing.  Pt received seated on toilet, ambulated to walk-in shower with RW with min assist requiring min verbal cues when turning to sit on tub bench.  Pt overall close supervision with bathing and dressing this session demonstrating increased standing balance.  Utilized zip front bra with pt able to fasten without assist this session.  Pt able to don Rt shoe with brace and fasten without assist.  Discussed upgrading goals from min assist to supervision with bathing and dressing, with pt and daughter in agreement.  Ambulated in ADL apt with RW to complete laundry task of hanging clothes in closet.  Pt required min-mod assist with ambulation on carpet and standing balance while hanging clothes.  Pt reports would typically stand to fold laundry and put on hangers, recommending pt sit during laundry tasks and educated on use of RW while transporting clothes.  2) Engaged in hands on education with pt and pt's daughter regarding bathroom transfers in ADL tub room with focus on tub/shower transfers utilizing tub transfer bench and toilet transfers both using RW.  Educated on transfer technique with tub  bench with pt demonstrating understanding.  Had pt and daughter return demonstrate, educated daughter to provide min assist with all mobility with RW at this time and provide verbal cues as needed for LLE clearance with walking especially with turns.  Toilet transfers min assist with increased time for sit > stand due to low toilet. Discussed possibility of utilizing 3 in 1 over toilet to provide slight elevation and hand rails to push up from  as she may have difficulty with standard height toilet.  Pt and daughter report understanding and plan to discuss with primary OT prior to d/c.  Pt returned to bed via stand pivot with RW at end of session.  Therapy Documentation Precautions:  Precautions Precautions: Fall Precaution Comments: Pushes to R side.; midline disorientation Restrictions Weight Bearing Restrictions: No Pain: Pain Assessment Pain Assessment: No/denies pain  See FIM for current functional status  Therapy/Group: Individual Therapy  Simonne Come 10/16/2013, 12:04 PM

## 2013-10-16 NOTE — Progress Notes (Signed)
Physical Therapy Weekly Progress Note  Patient Details  Name: Candace Monroe MRN: [id number] Date of Birth: [birth year]  Beginning of progress report period: October 09, 2013 End of progress report period: October 16, 2013  Today's Date: 10/16/2013 PT Individual Time: 1545-1630 PT Individual Time Calculation (min): 45 min   Patient has met 2 of 3 short term goals.    Patient continues to demonstrate the following deficits: R hemiplegia, decreased activity tolerance, balance, postural control, ability to compensate for deficits, functional use of R UE/LE, attention, awareness, coordination, balance strategies, and therefore will continue to benefit from skilled PT intervention to enhance overall performance with activity tolerance, balance, postural control, ability to compensate for deficits, functional use of  right upper extremity and right lower extremity, attention, awareness and coordination.  Patient progressing toward long term goals..  Continue plan of care.  PT Short Term Goals Week 1:  PT Short Term Goal 1 (Week 1): Pt will consistently perform supine<>sit with min  A with HOB flat using rail. PT Short Term Goal 1 - Progress (Week 1): Met PT Short Term Goal 2 (Week 1): Pt will perform supine<>sit with min A with rails and HOB flat. PT Short Term Goal 2 - Progress (Week 1): Met PT Short Term Goal 3 (Week 1): Pt will consistently perform bed<>w/c transfer (both directions) with mod A. PT Short Term Goal 3 - Progress (Week 1): Met PT Short Term Goal 4 (Week 1): Pt will perform w/c mobility x50' with mod A and 50% cueing. PT Short Term Goal 4 - Progress (Week 1): Met PT Short Term Goal 5 (Week 1): Pt will perform gait x30' in controlled environment with max A of single therapist. PT Short Term Goal 5 - Progress (Week 1): Met Week 2:  PT Short Term Goal 1 (Week 2): Pt will perform basic transfers wtih S to R/L consistently with cues for technique PT Short Term Goal 1 - Progress (Week  2): Progressing toward goal PT Short Term Goal 2 (Week 2): Pt will perform gait training with Min A x50' with LRAD PT Short Term Goal 2 - Progress (Week 2): Met PT Short Term Goal 3 (Week 2): Pt will perform stairs x5 with 2 rails and Min A PT Short Term Goal 3 - Progress (Week 2): Met Week 3:  PT Short Term Goal 1 (Week 3): STGs=LTGs  Skilled Therapeutic Interventions/Progress Updates:    Patient received sitting in wheelchair, reports daughter has left for the day. Additional family ed unable to be completed. Session focused on addressing short term mobility goals. Patient performing functional transfers at min guard-minA level overall for wheelchair<>mat, bed mobility with supervision on flat bed without rails, gait training in controlled environment x50' with RW and R AFO and minA, and negotiation of 5 steps with B handrails and minA (moderate cues for proper sequencing). Patient returned to room and left sitting in wheelchair with all needs within reach.  Therapy Documentation Precautions:  Precautions Precautions: Fall Precaution Comments: Pushes to R side.; midline disorientation Restrictions Weight Bearing Restrictions: No Pain: Pain Assessment Pain Assessment: No/denies pain Pain Score: 0-No pain Locomotion : Ambulation Ambulation/Gait Assistance: 4: Min assist   See FIM for current functional status  Therapy/Group: Individual Therapy  Lillia Abed. Doil Kamara, PT, DPT 10/16/2013, 4:36 PM

## 2013-10-16 NOTE — Progress Notes (Signed)
Speech Language Pathology Discharge Summary  Patient Details  Name: Candace Monroe MRN: [id number] Date of Birth: 11-21-1940  Today's Date: 10/16/2013 SLP Individual Time: 7829-5621 SLP Individual Time Calculation (min): 45 min   Skilled Therapeutic Interventions:  Pt was seen for skilled ST targeting cognitive goals.  Upon arrival, pt was seated upright in wheelchair, awake, alert, and agreeable to participate in [place].  SLP provided supervision question cues to facilitate pt's recall of previous therapeutic activity and target carryover of information between therapy sessions.  SLP continued previously targeted sorting and organizing task and expanded upon task to increase cognitive challenge to include skills for planning and summarizing.  Pt picked up where she left off during previous therapy session and completed the abovementioned task for ~80% accuracy with min assist faded to supervision level verbal cues.  Pt was able to identify and correct errors with supervision cues.  SLP also facilitated the session with a functional recall task targeting use of written aids to facilitated improved recall of PT recommendations.  Per report, pt with decreased recall for sequencing going up and down steps.  SLP provided pt with a functional phrase ("Up with the good and down with the bad.") and posted a written visual aid in pt's room of said phrase to facilitate improved recall of new information.  Reviewed and reinforced recommendations with pt regarding recommendation for assistance for medication and financial management upon discharge.  No further ST needs indicated at this time.     Patient has met 3 of 3 long term goals.  Patient to discharge at overall Supervision level.  Reasons goals not met: n/a   Clinical Impression/Discharge Summary:  Pt made functional gains while inpatient and is discharging from [facility] having met 3 out of 3 long term goals due to improved problem solving for semi-complex daily  situations, use of compensatory strategies for recall of daily information, and emergent awareness of her physical and cognitive deficits.  Pt is currently overall supervision level assist for cognitive tasks due to decreased safety awareness and mental flexibility as well as poor frustration tolerances and demonstrates the most functional independence during tasks when given extra processing time.  Suspect pt is at her cognitive baseline given report from family.  Pt is to discharge home with 24/7 supervision from her family.  SLP also recommends that family provide assistance for medication and financial management at home.  Pt and family education is complete.  No further ST needs are indicated at this time.    Care Partner:  Caregiver Able to Provide Assistance: Yes  Type of Caregiver Assistance: Physical;Cognitive  Recommendation:  24 hour supervision/assistance;None     Equipment: none recommended by SLP    Reasons for discharge: Treatment goals met   Patient/Family Agrees with Progress Made and Goals Achieved: Yes   See FIM for current functional status  Candace Monroe, M.A. CCC-SLP  Candace Monroe, Selinda Orion 10/16/2013, 1:09 PM

## 2013-10-16 NOTE — Progress Notes (Signed)
Candace Monroe PHYSICAL MEDICINE & REHABILITATION     PROGRESS NOTE 73 year old female with history of HTN, diabetes (off medications), right breast mass, ongoing tobacco use who was admitted to [facility] on 09/27/13 with right sided weakness. Blood pressure 240/100 at admission. CT head without acute changes. MRI brain done revealing acute lacunar infarct left frontal lobe peri-Rolandic white matter, moderate to severe underlying chronic small vessel disease. 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 Cardiac echo with EF 50-60% with no wall abnormality, trace MVR. She was evaluated by Dr. Irish Elders who recommended full strength ASA for thrombotic stroke due to uncontrolled HTN, tobacco use and diabetes    Subjective/Complaints: No pain c/os, pt without new issues Pt not having issues with breathing   A  review of systems has been performed and if not noted above is otherwise negative.   Objective: Vital Signs: Blood pressure 179/69, pulse 72, temperature 98.3 F (36.8 C), temperature source Oral, resp. rate 18, height 5' (1.524 m), weight 55.248 kg (121 lb 12.8 oz), SpO2 98.00%. No results found. No results found for this basename: WBC, HGB, HCT, PLT,  in the last 72 hours  Recent Labs  10/15/13 0615  NA 139  K 4.3  CL 105  GLUCOSE 115*  BUN 31*  CREATININE 1.25*  CALCIUM 8.9   CBG (last 3)   Recent Labs  10/15/13 1641 10/15/13 2154 10/16/13 0713  GLUCAP 120* 96 124*    Wt Readings from Last 3 Encounters:  10/14/13 55.248 kg (121 lb 12.8 oz)  10/02/13 57.607 kg (127 lb)    Physical Exam:  Nursing note and vitals reviewed.  Constitutional: She is oriented to person, place, and time. She appears well-developed and well-nourished.   HENT: Oral mucosa pink and moist  Head: Normocephalic and atraumatic.  Eyes: Conjunctivae are normal. Pupils are equal, round, and reactive to light.  Neck: Normal range of motion. Neck supple.   Cardiovascular: Normal rate and regular rhythm.  Respiratory: Effort normal. . She has no rales. She exhibits no tenderness.  GI: Soft. Bowel sounds are normal. She exhibits no distension. There is no tenderness.  Musculoskeletal: She exhibits no edema and no tenderness.  Neurological: She is alert and oriented to person, place, and time. Sensory intake Speech clear. Able to follow two step commands without difficulty. Right hemiparesis--deltoid 4/5, biceps/triceps 4/5, intrinsics 4/5, HF 4-/5, KE 4-/5, PF 3-/5, DF 2-. Skin: Skin is warm and dry. On ext, no evidence of stasis dermatitis or heel breakdown Psychiatric: She has a normal mood and affect. Her behavior is normal. Judgment and thought content normal   Assessment/Plan: 1. Functional deficits secondary to left frontal infarct which require 3+ hours per day of interdisciplinary therapy in a comprehensive inpatient rehab setting. Physiatrist is providing close team supervision and 24 hour management of active medical problems listed below. Physiatrist and rehab team continue to assess barriers to discharge/monitor patient progress toward functional and medical goals. Discussed blood work with pt , recheck BMET , in am and if BUN<30 consider D/C IVF FIM: FIM - Bathing Bathing Steps Patient Completed: Chest;Right Arm;Left Arm;Abdomen;Front perineal area;Buttocks;Right upper leg;Left upper leg;Right lower leg (including foot);Left lower leg (including foot) Bathing: 4: Steadying assist  FIM - Upper Body Dressing/Undressing Upper body dressing/undressing steps patient completed: Thread/unthread right bra strap;Thread/unthread left bra strap;Thread/unthread right sleeve of pullover shirt/dresss;Thread/unthread left sleeve of pullover shirt/dress;Put head through opening of pull over shirt/dress;Pull shirt over trunk Upper body  dressing/undressing: 4: Min-Patient completed 75 plus % of tasks FIM - Lower Body Dressing/Undressing Lower body  dressing/undressing steps patient completed: Thread/unthread right underwear leg;Thread/unthread left underwear leg;Thread/unthread right pants leg;Thread/unthread left pants leg;Pull pants up/down;Don/Doff left sock;Don/Doff right sock;Pull underwear up/down;Don/Doff right shoe;Don/Doff left shoe;Fasten/unfasten right shoe;Fasten/unfasten left shoe Lower body dressing/undressing: 4: Steadying Assist  FIM - Toileting Toileting steps completed by patient: Adjust clothing prior to toileting;Performs perineal hygiene;Adjust clothing after toileting Toileting Assistive Devices: Grab bar or rail for support Toileting: 4: Steadying assist  FIM - Radio producer Devices: Grab bars;Walker Toilet Transfers: 3-To toilet/BSC: Mod A (lift or lower assist);3-From toilet/BSC: Mod A (lift or lower assist)  FIM - Bed/Chair Transfer Bed/Chair Transfer Assistive Devices: Copy: 4: Chair or W/C > Bed: Min A (steadying Pt. > 75%);4: Bed > Chair or W/C: Min A (steadying Pt. > 75%)  FIM - Locomotion: Wheelchair Distance: 150 Locomotion: Wheelchair: 1: Travels less than 50 ft with supervision, cueing or coaxing FIM - Locomotion: Ambulation Locomotion: Ambulation Assistive Devices: Walker - Rolling;Orthosis Ambulation/Gait Assistance: 4: Min assist;3: Mod assist Locomotion: Ambulation: 2: Travels 50 - 149 ft with moderate assistance (Pt: 50 - 74%)  Comprehension Comprehension Mode: Auditory Comprehension: 5-Follows basic conversation/direction: With extra time/assistive device  Expression Expression Mode: Verbal Expression: 5-Expresses basic needs/ideas: With no assist  Social Interaction Social Interaction Mode: Asleep Social Interaction: 5-Interacts appropriately 90% of the time - Needs monitoring or encouragement for participation or interaction.  Problem Solving Problem Solving Mode: Asleep Problem Solving: 4-Solves basic 75 - 89% of the time/requires  cueing 10 - 24% of the time  Memory Memory Mode: Asleep Memory: 2-Recognizes or recalls 25 - 49% of the time/requires cueing 51 - 75% of the time  Medical Problem List and Plan:  1. 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 DVT Prophylaxis/Anticoagulation: Pharmaceutical: Lovenox  3. Pain Management: N/A  4. Depression/Mood: continue lexapro  5. Neuropsych: This patient is capable of making decisions on her own behalf.  6. Skin/Wound Care: Routine pressure relief measures , 7. Fluids/Electrolytes/Nutrition: Monitor I/O. Offer supplements if intake poor. GFR lower will stop ACE, add CCB 8. HTN: Blood pressures trending upward and medications resumed today. Permissive hypertension to allow perfusion. Will monitor every 8 hours and titrate medication to home dose.  9. Diabetes: No medications for past 1-2 years--reports she lost a lot of weight and last hgb A1c around 5. Will check hgb A1c. Fasting BS 108-135 range.  -dietary ed  10. Dyslipidemia: Continue lipitor.  11.  Reflux symptoms appear to be improving on Pepcid 12.  Acute renal insufficiency-GFR lower will stop ACE, add CCB    LOS (Days) 14 A FACE TO FACE EVALUATION WAS PERFORMED  Kareem Cathey E 10/16/2013 8:13 AM

## 2013-10-17 ENCOUNTER — Inpatient Hospital Stay (HOSPITAL_COMMUNITY): Payer: Medicare Other | Admitting: Physical Therapy

## 2013-10-17 LAB — BASIC METABOLIC PANEL
Anion gap: 12 (ref 5–15)
BUN: 27 mg/dL — AB (ref 6–23)
CALCIUM: 8.9 mg/dL (ref 8.4–10.5)
CO2: 22 mEq/L (ref 19–32)
CREATININE: 1.2 mg/dL — AB (ref 0.50–1.10)
Chloride: 106 mEq/L (ref 96–112)
GFR calc Af Amer: 50 mL/min — ABNORMAL LOW (ref >90)
GFR calc non Af Amer: 43 mL/min — ABNORMAL LOW (ref >90)
GLUCOSE: 117 mg/dL — AB (ref 70–99)
Potassium: 4.5 mEq/L (ref 3.7–5.3)
Sodium: 140 mEq/L (ref 137–147)

## 2013-10-17 LAB — GLUCOSE, CAPILLARY
GLUCOSE-CAPILLARY: 72 mg/dL (ref 70–99)
GLUCOSE-CAPILLARY: 99 mg/dL (ref 70–99)
Glucose-Capillary: 132 mg/dL — ABNORMAL HIGH (ref 70–99)
Glucose-Capillary: 112 mg/dL — ABNORMAL HIGH (ref 70–99)

## 2013-10-17 MED ORDER — AMLODIPINE BESYLATE 10 MG PO TABS
10.0000 mg | ORAL_TABLET | Freq: Every day | ORAL | Status: DC
  Administered 2013-10-18 – 2013-10-21 (×4): 10 mg via ORAL
  Filled 2013-10-17 (×5): qty 1

## 2013-10-17 NOTE — Progress Notes (Signed)
Physical Therapy Session Note  Patient Details  Name: Candace Monroe MRN: [id number] Date of Birth: 1940-09-03  Today's Date: 10/17/2013 PT Individual Time: 0900-1000 PT Individual Time Calculation (min): 60 min   Short Term Goals: Week 1:  PT Short Term Goal 1 (Week 1): Pt will consistently perform supine<>sit with min  A with HOB flat using rail. PT Short Term Goal 1 - Progress (Week 1): Met PT Short Term Goal 2 (Week 1): Pt will perform supine<>sit with min A with rails and HOB flat. PT Short Term Goal 2 - Progress (Week 1): Met PT Short Term Goal 3 (Week 1): Pt will consistently perform bed<>w/c transfer (both directions) with mod A. PT Short Term Goal 3 - Progress (Week 1): Met PT Short Term Goal 4 (Week 1): Pt will perform w/c mobility x50' with mod A and 50% cueing. PT Short Term Goal 4 - Progress (Week 1): Met PT Short Term Goal 5 (Week 1): Pt will perform gait x30' in controlled environment with max A of single therapist. PT Short Term Goal 5 - Progress (Week 1): Met  Skilled Therapeutic Interventions/Progress Updates:  Pt was seen bedside in the am. Pt propelled w/c about 150 feet with B UEs and oocasional verbal cues for safety. Once in gym treatment focused on LE strengthening, NMR and balance. Pt ambulated about 83 feet with rolling walker, R toe off brace and min guard to min A with occasional verbal cues. Pt performed sit to stand transfers 2 sets x 5 reps each without hands. Pt performed multiple stand pivot transfers with rolling walker and min guard to min A with verbal cues for safety. Pt transferred edge of mat to supine and supine to edge of mat with S and verbal cues. While supine on mat treatment focused on unilateral bridging, bridging and lower body trunk rotation. Pt propelled w/c about 50 feet with B UEs. Pt returned to room and left sitting up in w/c with call bell within reach and quick release belt in place.   Therapy Documentation Precautions:   Precautions Precautions: Fall Precaution Comments: Pushes to R side.; midline disorientation Restrictions Weight Bearing Restrictions: No General:   Pain: No c/o pain.    Locomotion : Ambulation Ambulation/Gait Assistance: 4: Min guard;4: Min assist   See FIM for current functional status  Therapy/Group: Individual Therapy  Dub Amis 10/17/2013, 12:45 PM

## 2013-10-17 NOTE — Progress Notes (Signed)
[facility] PHYSICAL MEDICINE & REHABILITATION     PROGRESS NOTE 73 year old female with history of HTN, diabetes (off medications), right breast mass, ongoing tobacco use who was admitted to [facility] on 09/27/13 with right sided weakness. Blood pressure 240/100 at admission. CT head without acute changes. MRI brain done revealing acute lacunar infarct left frontal lobe peri-Rolandic white matter, moderate to severe underlying chronic small vessel disease. 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 Cardiac echo with EF 50-60% with no wall abnormality, trace MVR. She was evaluated by Dr. Irish Elders who recommended full strength ASA for thrombotic stroke due to uncontrolled HTN, tobacco use and diabetes    Subjective/Complaints: No pain c/os, pt without new issues Pt not having issues with breathing   A  review of systems has been performed and if not noted above is otherwise negative.   Objective: Vital Signs: Blood pressure 161/83, pulse 69, temperature 97.9 F (36.6 C), temperature source Oral, resp. rate 16, height 5' (1.524 m), weight 55.248 kg (121 lb 12.8 oz), SpO2 98.00%. No results found. No results found for this basename: WBC, HGB, HCT, PLT,  in the last 72 hours  Recent Labs  10/15/13 0615 10/17/13 0510  NA 139 140  K 4.3 4.5  CL 105 106  GLUCOSE 115* 117*  BUN 31* 27*  CREATININE 1.25* 1.20*  CALCIUM 8.9 8.9   CBG (last 3)   Recent Labs  10/16/13 1640 10/16/13 2056 10/17/13 0725  GLUCAP 119* 84 132*    Wt Readings from Last 3 Encounters:  10/14/13 55.248 kg (121 lb 12.8 oz)  10/02/13 57.607 kg (127 lb)    Physical Exam:  Nursing note and vitals reviewed.  Constitutional: She is oriented to person, place, and time. She appears well-developed and well-nourished.   HENT: Oral mucosa pink and moist  Head: Normocephalic and atraumatic.  Eyes: Conjunctivae are normal. Pupils are equal, round, and reactive to light.   Neck: Normal range of motion. Neck supple.  Cardiovascular: Normal rate and regular rhythm.  Respiratory: Effort normal. . She has no rales. She exhibits no tenderness.  GI: Soft. Bowel sounds are normal. She exhibits no distension. There is no tenderness.  Musculoskeletal: She exhibits no edema and no tenderness.  Neurological: She is alert and oriented to person, place, and time. Sensory intake Speech clear. Able to follow two step commands without difficulty. Right hemiparesis--deltoid 4/5, biceps/triceps 4/5, intrinsics 4/5, HF 4-/5, KE 4-/5, PF 3-/5, DF 2-. Skin: Skin is warm and dry. On ext, no evidence of stasis dermatitis or heel breakdown Psychiatric: She has a normal mood and affect. Her behavior is normal. Judgment and thought content normal   Assessment/Plan: 1. Functional deficits secondary to left frontal infarct which require 3+ hours per day of interdisciplinary therapy in a comprehensive inpatient rehab setting. Physiatrist is providing close team supervision and 24 hour management of active medical problems listed below. Physiatrist and rehab team continue to assess barriers to discharge/monitor patient progress toward functional and medical goals. Discussed blood work with pt , recheck BMET with BUN <30 will d/c IVF FIM: FIM - Bathing Bathing Steps Patient Completed: Chest;Right Arm;Left Arm;Abdomen;Front perineal area;Buttocks;Right upper leg;Left upper leg;Right lower leg (including foot);Left lower leg (including foot) Bathing: 5: Supervision: Safety issues/verbal cues  FIM - Upper Body Dressing/Undressing Upper body dressing/undressing steps patient completed: Thread/unthread right bra strap;Thread/unthread left bra strap;Thread/unthread right sleeve of pullover shirt/dresss;Thread/unthread left sleeve of pullover shirt/dress;Put head through opening of  pull over shirt/dress;Pull shirt over trunk;Hook/unhook bra Upper body dressing/undressing: 5: Set-up assist to:  Obtain clothing/put away FIM - Lower Body Dressing/Undressing Lower body dressing/undressing steps patient completed: Thread/unthread right underwear leg;Thread/unthread left underwear leg;Thread/unthread right pants leg;Thread/unthread left pants leg;Pull pants up/down;Don/Doff left sock;Don/Doff right sock;Pull underwear up/down;Don/Doff right shoe;Don/Doff left shoe;Fasten/unfasten right shoe;Fasten/unfasten left shoe Lower body dressing/undressing: 5: Set-up assist to: Obtain clothing  FIM - Toileting Toileting steps completed by patient: Adjust clothing prior to toileting;Performs perineal hygiene;Adjust clothing after toileting Toileting Assistive Devices: Grab bar or rail for support Toileting: 4: Steadying assist  FIM - Radio producer Devices: Grab bars;Walker Toilet Transfers: 4-To toilet/BSC: Min A (steadying Pt. > 75%);4-From toilet/BSC: Min A (steadying Pt. > 75%)  FIM - Control and instrumentation engineer Devices: Walker;Arm rests Bed/Chair Transfer: 4: Bed > Chair or W/C: Min A (steadying Pt. > 75%);4: Chair or W/C > Bed: Min A (steadying Pt. > 75%)  FIM - Locomotion: Wheelchair Distance: 150 Locomotion: Wheelchair: 1: Total Assistance/staff pushes wheelchair (Pt<25%) FIM - Locomotion: Ambulation Locomotion: Ambulation Assistive Devices: Walker - Rolling;Orthosis Ambulation/Gait Assistance: 4: Min assist Locomotion: Ambulation: 2: Travels 50 - 149 ft with minimal assistance (Pt.>75%)  Comprehension Comprehension Mode: Auditory Comprehension: 5-Follows basic conversation/direction: With no assist  Expression Expression Mode: Verbal Expression: 5-Expresses basic needs/ideas: With no assist  Social Interaction Social Interaction Mode: Asleep Social Interaction: 5-Interacts appropriately 90% of the time - Needs monitoring or encouragement for participation or interaction.  Problem Solving Problem Solving Mode: Asleep Problem  Solving: 5-Solves basic 90% of the time/requires cueing < 10% of the time  Memory Memory Mode: Asleep Memory: 5-Recognizes or recalls 90% of the time/requires cueing < 10% of the time  Medical Problem List and Plan:  1. 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 DVT Prophylaxis/Anticoagulation: Pharmaceutical: Lovenox  3. Pain Management: N/A  4. Depression/Mood: continue lexapro  5. Neuropsych: This patient is capable of making decisions on her own behalf.  6. Skin/Wound Care: Routine pressure relief measures , 7. Fluids/Electrolytes/Nutrition: Monitor I/O. Offer supplements if intake poor. GFR lower will stop ACE, add CCB 8. HTN: Blood pressures trending upward and medications resumed today. Permissive hypertension to allow perfusion. Will monitor every 8 hours and titrate medication to home dose.  9. Diabetes: No medications for past 1-2 years--reports she lost a lot of weight and last hgb A1c around 5. Will check hgb A1c. Fasting BS 108-135 range.  -dietary ed  10. Dyslipidemia: Continue lipitor.  11.  Reflux symptoms appear to be improving on Pepcid 12.  Acute renal insufficiency-GFR lower will stop ACE, add CCB, increase dose    LOS (Days) 15 A FACE TO FACE EVALUATION WAS PERFORMED  Windsor Goeken E 10/17/2013 9:07 AM

## 2013-10-18 ENCOUNTER — Inpatient Hospital Stay (HOSPITAL_COMMUNITY): Payer: Medicare Other | Admitting: Rehabilitation

## 2013-10-18 LAB — GLUCOSE, CAPILLARY
GLUCOSE-CAPILLARY: 96 mg/dL (ref 70–99)
Glucose-Capillary: 115 mg/dL — ABNORMAL HIGH (ref 70–99)
Glucose-Capillary: 87 mg/dL (ref 70–99)
Glucose-Capillary: 106 mg/dL — ABNORMAL HIGH (ref 70–99)

## 2013-10-18 NOTE — Progress Notes (Signed)
Physical Therapy Session Note  Patient Details  Name: Candace Monroe MRN: [id number] Date of Birth: 1940/06/28  Today's Date: 10/18/2013 PT Individual Time: 1100-1200 PT Individual Time Calculation (min): 60 min   Short Term Goals: Week 2:  PT Short Term Goal 1 (Week 2): Pt will perform basic transfers wtih S to R/L consistently with cues for technique PT Short Term Goal 1 - Progress (Week 2): Progressing toward goal PT Short Term Goal 2 (Week 2): Pt will perform gait training with Min A x50' with LRAD PT Short Term Goal 2 - Progress (Week 2): Met PT Short Term Goal 3 (Week 2): Pt will perform stairs x5 with 2 rails and Min A PT Short Term Goal 3 - Progress (Week 2): Met  Skilled Therapeutic Interventions/Progress Updates:   Pt received sitting in w/c in room, agreeable to therapy session.  Several family members in room, therefore discussed hands on family training with son.  He states he will be there to assist some what daughter and husband aren't or have stepped out.   PT assisted pt with gait x 80' x 1 with RW at min A level with R AFO.  Provided cues for upright posture, increased weight shift L and increased R foot clearance.  Note that she is to have leather shoe cap placed on R shoe Monday or Tuesday to assist with foot clearance.  Then had son ambulate with pt x another 72' with hand over hand cues to son for how to assist pt on her R side and at hips/chest to prevent forwards/backwards LOB.  Provided cues to pt and son throughout for upright posture, increased R foot clearance and max verbal cues to turn completely around and reach back for chair prior to sitting.  Then assisted pt into therapy gym to work on stair negotiation.  Son and daughter in law wanting to leave and state that daughter and husband will be the ones mainly assisting.  Discussed that it would be beneficial for them to see her perform stairs so that whomever will be assisting her will be safe and know how to cue pt.   They stayed briefly but then left.  Pt performed 5, 4" and 6" steps with B handrails to simulate home entry.  Cues for increased R foot clearance during stairs and where to assist pt for safety.  Added surgical shoe cover to R shoe for clearance until shoe cap can be added.  Note marked improvement with gait x another 57' into ADL apt and better clearance on carpet.  She continues to require MAX verbal cues for safety when turning and tends to not retro step enough then "plops" into chair/bed.  Pt resistant to education and states "I know" or "My chairs won't slide at home."  Then performed car transfer to simulate home vehicle height at min A level, again cues for safety when turning.  Ended session with seated nustep x 9 mins with BLEs only for NMR through RLE with focus on keeping R knee adducted during task (resistance at level 3).  Pt tolerated well.   Assisted back to room via w/c and left in w/c with all needs in reach.    Therapy Documentation Precautions:  Precautions Precautions: Fall Precaution Comments: Pushes to R side.; midline disorientation Restrictions Weight Bearing Restrictions: No   Pain: Pain Assessment Pain Assessment: No/denies pain Pain Score: 0-No pain Faces Pain Scale: No hurt PAINAD (Pain Assessment in Advanced Dementia) Breathing: normal  See FIM for current  functional status  Therapy/Group: Individual Therapy  Denice Bors 10/18/2013, 11:44 AM

## 2013-10-18 NOTE — Progress Notes (Signed)
[place] PHYSICAL MEDICINE & REHABILITATION     PROGRESS NOTE 73 year old female with history of HTN, diabetes (off medications), right breast mass, ongoing tobacco use who was admitted to [place] on 09/27/13 with right sided weakness. Blood pressure 240/100 at admission. CT head without acute changes. MRI brain done revealing acute lacunar infarct left frontal lobe peri-Rolandic white matter, moderate to severe underlying chronic small vessel disease. 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 Cardiac echo with EF 50-60% with no wall abnormality, trace MVR. She was evaluated by Dr. Irish Elders who recommended full strength ASA for thrombotic stroke due to uncontrolled HTN, tobacco use and diabetes    Subjective/Complaints: Off IVF Intake 75-100%   A  review of systems has been performed and if not noted above is otherwise negative.   Objective: Vital Signs: Blood pressure 180/89, pulse 67, temperature 98.7 F (37.1 C), temperature source Oral, resp. rate 17, height 5' (1.524 m), weight 55.248 kg (121 lb 12.8 oz), SpO2 97.00%. No results found. No results found for this basename: WBC, HGB, HCT, PLT,  in the last 72 hours  Recent Labs  10/17/13 0510  NA 140  K 4.5  CL 106  GLUCOSE 117*  BUN 27*  CREATININE 1.20*  CALCIUM 8.9   CBG (last 3)   Recent Labs  10/17/13 1646 10/17/13 2114 10/18/13 0710  GLUCAP 99 112* 115*    Wt Readings from Last 3 Encounters:  10/14/13 55.248 kg (121 lb 12.8 oz)  10/02/13 57.607 kg (127 lb)    Physical Exam:  Nursing note and vitals reviewed.  Constitutional: She is oriented to person, place, and time. She appears well-developed and well-nourished.   HENT: Oral mucosa pink and moist  Head: Normocephalic and atraumatic.  Eyes: Conjunctivae are normal. Pupils are equal, round, and reactive to light.  Neck: Normal range of motion. Neck supple.  Cardiovascular: Normal rate and regular rhythm.   Respiratory: Effort normal. . She has no rales. She exhibits no tenderness.  GI: Soft. Bowel sounds are normal. She exhibits no distension. There is no tenderness.  Musculoskeletal: She exhibits no edema and no tenderness.  Neurological: She is alert and oriented to person, place, and time. Sensory intake Speech clear. Able to follow two step commands without difficulty. Right hemiparesis--deltoid 4/5, biceps/triceps 4/5, intrinsics 4/5, HF 4-/5, KE 4-/5, PF 3-/5, DF 2-. Skin: Skin is warm and dry. On ext, no evidence of stasis dermatitis or heel breakdown Psychiatric: She has a normal mood and affect. Her behavior is normal. Judgment and thought content normal   Assessment/Plan: 1. Functional deficits secondary to left frontal infarct which require 3+ hours per day of interdisciplinary therapy in a comprehensive inpatient rehab setting. Physiatrist is providing close team supervision and 24 hour management of active medical problems listed below. Physiatrist and rehab team continue to assess barriers to discharge/monitor patient progress toward functional and medical goals.  FIM: FIM - Bathing Bathing Steps Patient Completed: Chest;Right Arm;Left Arm;Abdomen;Front perineal area;Buttocks;Right upper leg;Left upper leg;Right lower leg (including foot);Left lower leg (including foot) Bathing: 5: Supervision: Safety issues/verbal cues  FIM - Upper Body Dressing/Undressing Upper body dressing/undressing steps patient completed: Thread/unthread right bra strap;Thread/unthread left bra strap;Thread/unthread right sleeve of pullover shirt/dresss;Thread/unthread left sleeve of pullover shirt/dress;Put head through opening of pull over shirt/dress;Pull shirt over trunk;Hook/unhook bra Upper body dressing/undressing: 5: Set-up assist to: Obtain clothing/put away FIM - Lower Body Dressing/Undressing Lower body dressing/undressing steps patient completed: Thread/unthread right underwear  leg;Thread/unthread left underwear leg;Thread/unthread right pants leg;Thread/unthread left pants leg;Pull pants up/down;Don/Doff left sock;Don/Doff right sock;Pull underwear up/down;Don/Doff right shoe;Don/Doff left shoe;Fasten/unfasten right shoe;Fasten/unfasten left shoe Lower body dressing/undressing: 5: Set-up assist to: Obtain clothing  FIM - Toileting Toileting steps completed by patient: Adjust clothing prior to toileting;Performs perineal hygiene;Adjust clothing after toileting Toileting Assistive Devices: Grab bar or rail for support Toileting: 4: Steadying assist  FIM - Radio producer Devices: Grab bars;Walker Toilet Transfers: 4-To toilet/BSC: Min A (steadying Pt. > 75%);4-From toilet/BSC: Min A (steadying Pt. > 75%)  FIM - Control and instrumentation engineer Devices: Walker;Arm rests Bed/Chair Transfer: 5: Supine > Sit: Supervision (verbal cues/safety issues);5: Sit > Supine: Supervision (verbal cues/safety issues);4: Chair or W/C > Bed: Min A (steadying Pt. > 75%);4: Bed > Chair or W/C: Min A (steadying Pt. > 75%)  FIM - Locomotion: Wheelchair Distance: 150 Locomotion: Wheelchair: 5: Travels 150 ft or more: maneuvers on rugs and over door sills with supervision, cueing or coaxing FIM - Locomotion: Ambulation Locomotion: Ambulation Assistive Devices: Administrator Ambulation/Gait Assistance: 4: Min guard;4: Min assist Locomotion: Ambulation: 2: Travels 50 - 149 ft with minimal assistance (Pt.>75%)  Comprehension Comprehension Mode: Auditory Comprehension: 5-Follows basic conversation/direction: With no assist  Expression Expression Mode: Verbal Expression: 5-Expresses basic needs/ideas: With no assist  Social Interaction Social Interaction Mode: Asleep Social Interaction: 5-Interacts appropriately 90% of the time - Needs monitoring or encouragement for participation or interaction.  Problem Solving Problem Solving Mode:  Asleep Problem Solving: 5-Solves basic 90% of the time/requires cueing < 10% of the time  Memory Memory Mode: Asleep Memory: 5-Recognizes or recalls 90% of the time/requires cueing < 10% of the time  Medical Problem List and Plan:  1. 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 DVT Prophylaxis/Anticoagulation: Pharmaceutical: Lovenox  3. Pain Management: N/A  4. Depression/Mood: continue lexapro  5. Neuropsych: This patient is capable of making decisions on her own behalf.  6. Skin/Wound Care: Routine pressure relief measures , 7. Fluids/Electrolytes/Nutrition: Monitor I/O. Offer supplements if intake poor. GFR lower will stop ACE, add CCB 8. HTN: Blood pressures trending upward and medications resumed today.allowaround 160 sys but starting to increase meds worse in am, Norvasc just increased Will monitor every 8 hours and titrate medication to home dose.  9. Diabetes: No medications for past 1-2 years--reports she lost a lot of weight and last hgb A1c around 5. Will check hgb A1c. Fasting BS 108-135 range.  -dietary ed  10. Dyslipidemia: Continue lipitor.  11.  Reflux symptoms appear to be improving on Pepcid 12.  Acute renal insufficiency-GFR lower will stop ACE, add CCB, increase dose    LOS (Days) 16 A FACE TO FACE EVALUATION WAS PERFORMED  KIRSTEINS,ANDREW E 10/18/2013 8:55 AM

## 2013-10-19 ENCOUNTER — Inpatient Hospital Stay (HOSPITAL_COMMUNITY): Payer: Medicare Other | Admitting: Occupational Therapy

## 2013-10-19 ENCOUNTER — Inpatient Hospital Stay (HOSPITAL_COMMUNITY): Payer: Medicare Other | Admitting: Physical Therapy

## 2013-10-19 ENCOUNTER — Inpatient Hospital Stay (HOSPITAL_COMMUNITY): Payer: Medicare Other

## 2013-10-19 LAB — GLUCOSE, CAPILLARY
GLUCOSE-CAPILLARY: 117 mg/dL — AB (ref 70–99)
Glucose-Capillary: 79 mg/dL (ref 70–99)
Glucose-Capillary: 108 mg/dL — ABNORMAL HIGH (ref 70–99)

## 2013-10-19 MED ORDER — DOXAZOSIN MESYLATE 2 MG PO TABS
2.0000 mg | ORAL_TABLET | Freq: Every day | ORAL | Status: DC
  Administered 2013-10-19 – 2013-10-20 (×2): 2 mg via ORAL
  Filled 2013-10-19 (×3): qty 1

## 2013-10-19 NOTE — Progress Notes (Signed)
Physical Therapy Session Note  Patient Details  Name: Candace Monroe MRN: [id number] Date of Birth: 16-Aug-1940  Today's Date: 10/19/2013 PT Individual Time: 1000-1100 PT Individual Time Calculation (min): 60 min   Short Term Goals: Week 3:  PT Short Term Goal 1 (Week 3): STGs=LTGs  Skilled Therapeutic Interventions/Progress Updates:  1:1. Pt received sitting in w/c, ready for therapy with daughter at side. Focus this session on family training, d/c planning and home safety. Extensive education with pt and daughter regarding home safety including, recommendation for 24hr supervision with hands on assist from ambulation and stair negotiation, fall prevention, safety at night, d/c process and goals/benefits of HH PT. Both verbalized understanding.   Pt req min A for completion of floor transfer with min cues for seq and safety. Following demonstration by therapist, pt's daughter assisting pt with negotiation up/down 4 steps with B rail as well as ambulation with RW and min A over modified compliant surface to simulate pt's walkway from driveway to stairs.   Pt req supervision for w/c propulsion 100'x1 with B UE and min guard A provided by daughter for ambulation 100'x1 with RW. Emphasis on emergent awareness of R step length due to progressive toe drag with fatigue. Pt would greatly benefit from addition of toe cap modification, orthotist made aware.   Pt left sitting in w/c at end of session w/ all needs in reach and daughter in room. Pt's daughter cleared to assist pt with all mobility in room. RN and nurse tech made aware, safety sheet updated.   Therapy Documentation Precautions:  Precautions Precautions: Fall Precaution Comments: Pushes to R side.; midline disorientation Restrictions Weight Bearing Restrictions: No   Pain: Pain Assessment Pain Assessment: No/denies pain Pain Score: 0-No pain  See FIM for current functional status  Therapy/Group: Individual Therapy  Gilmore Laroche 10/19/2013, 12:32 PM

## 2013-10-19 NOTE — Progress Notes (Addendum)
[place] PHYSICAL MEDICINE & REHABILITATION     PROGRESS NOTE 73 year old female with history of HTN, diabetes (off medications), right breast mass, ongoing tobacco use who was admitted to [facility] on 09/27/13 with right sided weakness. Blood pressure 240/100 at admission. CT head without acute changes. MRI brain done revealing acute lacunar infarct left frontal lobe peri-Rolandic white matter, moderate to severe underlying chronic small vessel disease. 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 Cardiac echo with EF 50-60% with no wall abnormality, trace MVR. She was evaluated by Dr. Irish Elders who recommended full strength ASA for thrombotic stroke due to uncontrolled HTN, tobacco use and diabetes    Subjective/Complaints: Off IVF Intake 75-100% Discussed BP, am systolic elevations   A  review of systems has been performed and if not noted above is otherwise negative.   Objective: Vital Signs: Blood pressure 179/64, pulse 69, temperature 98.5 F (36.9 C), temperature source Oral, resp. rate 18, height 5' (1.524 m), weight 55.248 kg (121 lb 12.8 oz), SpO2 96.00%. No results found. No results found for this basename: WBC, HGB, HCT, PLT,  in the last 72 hours  Recent Labs  10/17/13 0510  NA 140  K 4.5  CL 106  GLUCOSE 117*  BUN 27*  CREATININE 1.20*  CALCIUM 8.9   CBG (last 3)   Recent Labs  10/18/13 1622 10/18/13 2029 10/19/13 0647  GLUCAP 106* 96 117*    Wt Readings from Last 3 Encounters:  10/14/13 55.248 kg (121 lb 12.8 oz)  10/02/13 57.607 kg (127 lb)    Physical Exam:  Nursing note and vitals reviewed.  Constitutional: She is oriented to person, place, and time. She appears well-developed and well-nourished.   HENT: Oral mucosa pink and moist  Head: Normocephalic and atraumatic.  Eyes: Conjunctivae are normal. Pupils are equal, round, and reactive to light.  Neck: Normal range of motion. Neck supple.   Cardiovascular: Normal rate and regular rhythm.  Respiratory: Effort normal. . She has no rales. She exhibits no tenderness.  GI: Soft. Bowel sounds are normal. She exhibits no distension. There is no tenderness.  Musculoskeletal: She exhibits no edema and no tenderness.  Neurological: She is alert and oriented to person, place, and time. Sensory intake Speech clear. Able to follow two step commands without difficulty. Right hemiparesis--deltoid 4/5, biceps/triceps 4/5, intrinsics 4/5, HF 4-/5, KE 4-/5, PF 3-/5, DF 2-. Skin: Skin is warm and dry. On ext, no evidence of stasis dermatitis or heel breakdown Psychiatric: She has a normal mood and affect. Her behavior is normal. Judgment and thought content normal   Assessment/Plan: 1. Functional deficits secondary to left frontal infarct which require 3+ hours per day of interdisciplinary therapy in a comprehensive inpatient rehab setting. Physiatrist is providing close team supervision and 24 hour management of active medical problems listed below. Physiatrist and rehab team continue to assess barriers to discharge/monitor patient progress toward functional and medical goals.  FIM: FIM - Bathing Bathing Steps Patient Completed: Chest;Right Arm;Left Arm;Abdomen;Front perineal area;Buttocks;Right upper leg;Left upper leg;Right lower leg (including foot);Left lower leg (including foot) Bathing: 5: Supervision: Safety issues/verbal cues  FIM - Upper Body Dressing/Undressing Upper body dressing/undressing steps patient completed: Thread/unthread right bra strap;Thread/unthread left bra strap;Thread/unthread right sleeve of pullover shirt/dresss;Thread/unthread left sleeve of pullover shirt/dress;Put head through opening of pull over shirt/dress;Pull shirt over trunk;Hook/unhook bra Upper body dressing/undressing: 5: Set-up assist to: Obtain clothing/put away FIM - Lower Body Dressing/Undressing Lower body dressing/undressing steps  patient completed:  Thread/unthread right underwear leg;Thread/unthread left underwear leg;Thread/unthread right pants leg;Thread/unthread left pants leg;Pull pants up/down;Don/Doff left sock;Don/Doff right sock;Pull underwear up/down;Don/Doff right shoe;Don/Doff left shoe;Fasten/unfasten right shoe;Fasten/unfasten left shoe Lower body dressing/undressing: 5: Set-up assist to: Obtain clothing  FIM - Toileting Toileting steps completed by patient: Adjust clothing prior to toileting;Performs perineal hygiene;Adjust clothing after toileting Toileting Assistive Devices: Grab bar or rail for support Toileting: 4: Steadying assist  FIM - Radio producer Devices: Grab bars;Walker Toilet Transfers: 4-To toilet/BSC: Min A (steadying Pt. > 75%);4-From toilet/BSC: Min A (steadying Pt. > 75%)  FIM - Control and instrumentation engineer Devices: Walker;Arm rests Bed/Chair Transfer: 5: Sit > Supine: Supervision (verbal cues/safety issues);5: Supine > Sit: Supervision (verbal cues/safety issues);4: Bed > Chair or W/C: Min A (steadying Pt. > 75%);4: Chair or W/C > Bed: Min A (steadying Pt. > 75%)  FIM - Locomotion: Wheelchair Distance: 150 Locomotion: Wheelchair: 0: Activity did not occur FIM - Locomotion: Ambulation Locomotion: Ambulation Assistive Devices: Administrator Ambulation/Gait Assistance: 4: Min guard;4: Min assist Locomotion: Ambulation: 2: Travels 50 - 149 ft with minimal assistance (Pt.>75%)  Comprehension Comprehension Mode: Auditory Comprehension: 5-Follows basic conversation/direction: With no assist  Expression Expression Mode: Verbal Expression: 5-Expresses basic needs/ideas: With no assist  Social Interaction Social Interaction Mode: Asleep Social Interaction: 5-Interacts appropriately 90% of the time - Needs monitoring or encouragement for participation or interaction.  Problem Solving Problem Solving Mode: Asleep Problem Solving: 5-Solves basic 90% of  the time/requires cueing < 10% of the time  Memory Memory Mode: Asleep Memory: 5-Recognizes or recalls 90% of the time/requires cueing < 10% of the time  Medical Problem List and Plan:  1. 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 DVT Prophylaxis/Anticoagulation: Pharmaceutical: Lovenox  3. Pain Management: N/A  4. Depression/Mood: continue lexapro  5. Neuropsych: This patient is capable of making decisions on her own behalf.  6. Skin/Wound Care: Routine pressure relief measures 8. HTN: Blood pressures trending upward and medications resumed today.allow around 160 sys but starting to increase meds worse in am, Norvasc just increased Will monitor every 8 hours and titrate medication to home dose.  9. Diabetes: No medications for past 1-2 years--reports she lost a lot of weight and last hgb A1c around 5. Will check hgb A1c. Fasting BS 108-135 range.  -dietary ed  10. Dyslipidemia: Continue lipitor.  11.  Reflux symptoms appear to be improving on Pepcid 12.  Acute renal insufficiency-GFR lower will stop ACE, recheck BMET now that pt off IVF    LOS (Days) Felsenthal E 10/19/2013 7:31 AM

## 2013-10-19 NOTE — Progress Notes (Signed)
Physical Therapy Session Note  Patient Details  Name: Candace Monroe MRN: [id number] Date of Birth: August 24, 1940  Today's Date: 10/19/2013 PT Individual Time: 1300-1400 PT Individual Time Calculation (min): 60 min  Short Term Goals: Week 3:  PT Short Term Goal 1 (Week 3): STGs=LTGs  Skilled Therapeutic Interventions/Progress Updates:    Pt received in bathroom with daughter transferring pt from tiolet>w/c with only one shoe on. Per pt/daughter, pt wearing only one shoe due to orthotist making shoe modification at this time. Educated pt and daughter on risk of wearing socks without grips for standing and gait. Pt/daughter verbalized understanding. Assisted pt in doffing L shoe and donning bilat socks with grips. Session focused on continued hands-on family training with pt and daughter, [name].   In ortho gym, pt performed simulated car transfer with rolling walker and min A of daughter, who provided appropriate cueing for technique and hand placement. Per daughter, R stair railing has been "reinforced" and is now more structurally sound. Therefore, pt negotiated 3 stairs laterally with bilat UE support at R rail to ascend with step-to pattern and min A of daughter. Explained, demonstrated negotiation of standard curb with rolling walker and min guard, manual stabilization of rolling walker with effective return demonstration of pt and daughter.  In rehab apartment, pt performed short-distance ambulation trials in home environment with rolling walker and min guard-min A of daughter; daughter effectively provided cueing for safe technique with transfers. Performed multiple sit<>stand transfers from apartment couch and bed with min guard. Supine<>sit with supervision of daughter. In supine, pt's daughter cued pt to reposition self in bed; daughter provided tactile cueing at bilat LE's for weightbearing. Performed gait x75' in controlled environment with rolling walker and min guard-min A of daughter. Session  ended in pt room with pt semi reclined in bed with daughter present and all needs within reach.  Therapy Documentation Precautions:  Precautions Precautions: Fall Precaution Comments: Pushes to R side.; midline disorientation Restrictions Weight Bearing Restrictions: No Vital Signs: Therapy Vitals Temp: 97.9 F (36.6 C) Temp Source: Oral Pulse Rate: 64 Resp: 18 BP: 129/73 mmHg Patient Position (if appropriate): Lying Oxygen Therapy SpO2: 100 % O2 Device: None (Room air) Pain: Pain Assessment Pain Assessment: No/denies pain Locomotion : Ambulation Ambulation/Gait Assistance: 4: Min guard;4: Min assist   See FIM for current functional status  Therapy/Group: Individual Therapy  Hobble, Malva Cogan 10/19/2013, 4:09 PM

## 2013-10-19 NOTE — Progress Notes (Signed)
Occupational Therapy Session Note  Patient Details  Name: Candace Monroe MRN: [id number] Date of Birth: [birth year]  Today's Date: 10/19/2013 OT Individual Time: 0803-0903 OT Individual Time Calculation (min): 60 min    Short Term Goals: Week 1:  OT Short Term Goal 1 (Week 1): Pt will perform toilet transfer with Mod A in order to increase I in functional transfers.  OT Short Term Goal 1 - Progress (Week 1): Met OT Short Term Goal 2 (Week 1): Pt will perform bathing with Mod A in order to increase I in self care. OT Short Term Goal 2 - Progress (Week 1): Met OT Short Term Goal 3 (Week 1): Pt will perform toileting with Mod A in order to increase I in functional transfers.  OT Short Term Goal 3 - Progress (Week 1): Met OT Short Term Goal 4 (Week 1): Pt will performed seated laundry task , folding clothes, with Mod A and mod cues for seated balance.  OT Short Term Goal 4 - Progress (Week 1): Met Week 2:  OT Short Term Goal 1 (Week 2): Pt will be able to walk with RW into bathroom to access toilet with min A. OT Short Term Goal 1 - Progress (Week 2): Met OT Short Term Goal 2 (Week 2): Pt will be able to stand with Rw while donning pants with min A. OT Short Term Goal 2 - Progress (Week 2): Met OT Short Term Goal 3 (Week 2): Pt will complete tub bench transfers with min A using RW. OT Short Term Goal 3 - Progress (Week 2): Met OT Short Term Goal 4 (Week 2): Pt will demonstrate improved R FMC to fasten her bra without A.  OT Short Term Goal 4 - Progress (Week 2): Met Week 3:  OT Short Term Goal 1 (Week 3): STG = LTGs due to remaining LOS  Skilled Therapeutic Interventions/Progress Updates:    Pt seen for BADL retraining with family education with her daughter with a main focus on safe functional mobility with RW and awareness of R foot placement along with postural awareness. Pt only wanted to sponge bathe as she will shower tomorrow for her grad day. Pt is now close supervision with her  bathing and dressing and needs that close supervision as she sometimes drifts to the R in standing and needs cues to correct her posture. Discussed where pt would be most comfortable dressing at home and pt feels that the EOB will be the easiest location. We will plan to work on this in the ADL apartment tomorrow. Pt worked on bed to toilet transfers 2x, one with therapist and one with her daughter using RW. Pt continues to need min A as she has difficulty with R foot placement. Pt worked on standing and transferring activities with cues to attend to her foot placement and pt was able to step through and back with no A, only very close supervision. Pt states that she can feel her foot on the floor. Pt resting in w/c at end of session with daughter in the room.   Therapy Documentation Precautions:  Precautions Precautions: Fall Precaution Comments: Pushes to R side.; midline disorientation Restrictions Weight Bearing Restrictions: No   Pain: Pain Assessment Pain Assessment: No/denies pain Pain Score: 0-No pain ADL:  See FIM for current functional status  Therapy/Group: Individual Therapy  Terrell 10/19/2013, 11:34 AM

## 2013-10-20 ENCOUNTER — Inpatient Hospital Stay (HOSPITAL_COMMUNITY): Payer: Medicare Other | Admitting: *Deleted

## 2013-10-20 ENCOUNTER — Inpatient Hospital Stay (HOSPITAL_COMMUNITY): Payer: Medicare Other | Admitting: Occupational Therapy

## 2013-10-20 ENCOUNTER — Inpatient Hospital Stay (HOSPITAL_COMMUNITY): Payer: Medicare Other | Admitting: Speech Pathology

## 2013-10-20 DIAGNOSIS — I10 Essential (primary) hypertension: Secondary | ICD-10-CM

## 2013-10-20 DIAGNOSIS — G8110 Spastic hemiplegia affecting unspecified side: Secondary | ICD-10-CM

## 2013-10-20 DIAGNOSIS — I6330 Cerebral infarction due to thrombosis of unspecified cerebral artery: Secondary | ICD-10-CM

## 2013-10-20 LAB — GLUCOSE, CAPILLARY
GLUCOSE-CAPILLARY: 112 mg/dL — AB (ref 70–99)
Glucose-Capillary: 132 mg/dL — ABNORMAL HIGH (ref 70–99)
Glucose-Capillary: 101 mg/dL — ABNORMAL HIGH (ref 70–99)
Glucose-Capillary: 122 mg/dL — ABNORMAL HIGH (ref 70–99)
Glucose-Capillary: 122 mg/dL — ABNORMAL HIGH (ref 70–99)

## 2013-10-20 NOTE — Progress Notes (Signed)
Social Work Patient ID: Candace Monroe, female   DOB: 21-Jan-1940, 73 y.o.   MRN: [id number] Met with daughter to discuss discharge tomorrow.  She feels comfortable with Mom's care and discharge. Will begin home health due to co-pays and transition to OP.  Set for discharge tomorrow.

## 2013-10-20 NOTE — Progress Notes (Signed)
Occupational Therapy Session Note  Patient Details  Name: MARGEE TRENTHAM MRN: [id number] Date of Birth: [birth year]  Today's Date: 10/20/2013 OT Individual Time: 0802-0903 OT Individual Time Calculation (min): 61 min    Short Term Goals: Week 1:  OT Short Term Goal 1 (Week 1): Pt will perform toilet transfer with Mod A in order to increase I in functional transfers.  OT Short Term Goal 1 - Progress (Week 1): Met OT Short Term Goal 2 (Week 1): Pt will perform bathing with Mod A in order to increase I in self care. OT Short Term Goal 2 - Progress (Week 1): Met OT Short Term Goal 3 (Week 1): Pt will perform toileting with Mod A in order to increase I in functional transfers.  OT Short Term Goal 3 - Progress (Week 1): Met OT Short Term Goal 4 (Week 1): Pt will performed seated laundry task , folding clothes, with Mod A and mod cues for seated balance.  OT Short Term Goal 4 - Progress (Week 1): Met Week 2:  OT Short Term Goal 1 (Week 2): Pt will be able to walk with RW into bathroom to access toilet with min A. OT Short Term Goal 1 - Progress (Week 2): Met OT Short Term Goal 2 (Week 2): Pt will be able to stand with Rw while donning pants with min A. OT Short Term Goal 2 - Progress (Week 2): Met OT Short Term Goal 3 (Week 2): Pt will complete tub bench transfers with min A using RW. OT Short Term Goal 3 - Progress (Week 2): Met OT Short Term Goal 4 (Week 2): Pt will demonstrate improved R FMC to fasten her bra without A.  OT Short Term Goal 4 - Progress (Week 2): Met Week 3:  OT Short Term Goal 1 (Week 3): STG = LTGs due to remaining LOS  Skilled Therapeutic Interventions/Progress Updates:    Pt seen for BADL retraining of B/D in ADL apartment to allow pt to use tub bench with a focus on safe transfers with her RW. Pt's daughter asleep in the room, she was informed we were leaving and did not come to observe her therapy.  Pt worked with using RW to enter bathroom and exit without shoes on and  did very well with full step throughs forward and back.She only required steady A on/off tub bench. Pt dressed from w/c in bathroom and then Pt donned socks and shoes from bed, but would lean to R too far when crossing her legs. Reviewed with pt that she will have to dress from the w/c or an arm chair at home to maintain safe posture. Pt taken back to her room to complete her grooming. Daughter in room with pt.  Therapy Documentation Precautions:  Precautions Precautions: Fall Precaution Comments: Pushes to R side.; midline disorientation Restrictions Weight Bearing Restrictions: No    Vital Signs: Therapy Vitals Temp: 97.8 F (36.6 C) Temp Source: Oral Pulse Rate: 73 Resp: 18 BP: 141/51 mmHg Patient Position (if appropriate): Lying Oxygen Therapy SpO2: 97 % O2 Device: None (Room air) Pain: Pain Assessment Pain Assessment: No/denies pain ADL:   See FIM for current functional status  Therapy/Group: Individual Therapy  [place] 10/20/2013, 9:40 AM

## 2013-10-20 NOTE — Progress Notes (Signed)
Occupational Therapy Discharge Summary  Patient Details  Name: Candace Monroe MRN: [id number] Date of Birth: 03-Jan-1940   Patient has met 12 of 12 long term goals due to improved activity tolerance, improved balance, postural control, ability to compensate for deficits, functional use of  RIGHT upper extremity, improved attention, improved awareness and improved coordination.  Patient to discharge at [facility] Assist level.  Patient's care partner is independent to provide the necessary physical and cognitive assistance at discharge.    Reasons goals not met: n/a  Recommendation:  Patient will benefit from ongoing skilled OT services in home health setting to continue to advance functional skills in the area of BADL and iADL.  Equipment: tub bench, BSC  Reasons for discharge: treatment goals met  Patient/family agrees with progress made and goals achieved: Yes  OT Discharge Precautions/Restrictions  Precautions Precautions: Fall Precaution Comments: R-sided weakness LE.UE Restrictions Weight Bearing Restrictions: No  ADL ADL ADL Comments: Refer to FIM Vision/Perception  Vision- History Patient Visual Report: No change from baseline Vision- Assessment Eye Alignment: Within Functional Limits Perception Perception: Impaired Inattention/Neglect: Does not attend to right side of body;Other (comment) (Improved since eval, and pt is functional, but still present) Comments: WFL Praxis Praxis: Intact  Cognition Overall Cognitive Status: History of cognitive impairments - at baseline Arousal/Alertness: Awake/alert Orientation Level: Oriented X4 Attention: Selective Sustained Attention: Appears intact Sustained Attention Impairment: Verbal basic;Functional basic Selective Attention: Appears intact Selective Attention Impairment: Verbal basic;Functional basic Awareness: Impaired Awareness Impairment: Emergent impairment Problem Solving: Impaired Safety/Judgment: Appears  intact Comments: Pt continues to need S for Attention to complex tasks, memory, and awareness, but is functional and safe at this level.  Sensation Sensation Light Touch: Appears Intact Stereognosis: Appears Intact Hot/Cold: Appears Intact Proprioception: Appears Intact Coordination Gross Motor Movements are Fluid and Coordinated: No Fine Motor Movements are Fluid and Coordinated: Yes Coordination and Movement Description: Pt with decreased RLE coordination due to decreased motor control and weakness Finger Nose Finger Test: 10x in ten seconds on BUEs Motor  Motor Motor: Hemiplegia;Motor impersistence Motor - Skilled Clinical Observations: Pt with decreased activation of RLE extensor groups, especally with fatigue.  Motor - Discharge Observations: Pt has made good progress with motor recovery, but is still limited by decreased timing and accuracy of coordination Mobility  Bed Mobility Bed Mobility: Supine to Sit;Sit to Supine Supine to Sit: 6: Modified independent (Device/Increase time) Sit to Supine: 6: Modified independent (Device/Increase time)  Trunk/Postural Assessment  Cervical Assessment Cervical Assessment: Exceptions to [facility] (forward head tilt) Thoracic Assessment Thoracic Assessment:  (kyphosis) Lumbar Assessment Lumbar Assessment:  (posterior pelvic tilt) Postural Control Postural Control: Within Functional Limits Trunk Control: Trunk control is functional in sitting and standing Righting Reactions: Continues to have deficits, but the speed in reaction time is improving Postural Limitations: Pt continues to lean to the R and continues to need support 20% of the time. She is improving with with postural reaction times.  Balance Balance Balance Assessed: Yes Static Standing Balance Static Standing - Balance Support: No upper extremity supported;During functional activity Static Standing - Level of Assistance: 5: Stand by assistance Dynamic Standing Balance Dynamic  Standing - Balance Support: Right upper extremity supported;Left upper extremity supported;During functional activity Dynamic Standing - Level of Assistance: 4: Min assist Dynamic Standing - Balance Activities: Lateral lean/weight shifting;Reaching for objects Extremity/Trunk Assessment RUE Assessment RUE Assessment: Exceptions to WFL (4-/5 overall) LUE Assessment LUE Assessment: Within Functional Limits  See FIM for current functional status  Jasalyn Frysinger 10/20/2013, 12:16  PM

## 2013-10-20 NOTE — Progress Notes (Signed)
Recreational Therapy Discharge Summary Patient Details  Name: Candace Monroe MRN: [id number] Date of Birth: June 03, 1940 Today's Date: 10/20/2013  Long term goals set: 1  Long term goals met: 1  Comments on progress toward goals: Pt has made great progress toward goal and is ready for discharge home with family to provide/coordinate 24 hours supervision/assist.  Pt requires set up assist, extra time & occasional cuing to complete simple TR tasks seated.    Reasons for discharge: discharge from hospital Patient/family agrees with progress made and goals achieved: Yes  [name] 10/20/2013, 4:18 PM

## 2013-10-20 NOTE — Progress Notes (Signed)
Physical Therapy Discharge Summary  Patient Details  Name: Candace Monroe MRN: [id number] Date of Birth: [birth year]  Today's Date: 10/20/2013 PT Individual Time: 2458-0998 and 14:03-15:03  PT Individual Time Calculation (min): 60 min and 32mn    Patient has met 12 of 12 long term goals due to improved activity tolerance, improved balance, improved postural control, increased strength, ability to compensate for deficits, functional use of  right upper extremity and right lower extremity, improved attention, improved awareness and improved coordination.  Patient to discharge at an ambulatory level MSteinauer   Patient's care partner is independent to provide the necessary physical and cognitive assistance at discharge.  Recommendation:  Patient will benefit from ongoing skilled PT services in home health setting to continue to advance safe functional mobility, address ongoing impairments in RLE motor control, static and dynamic balance, coordination, and minimize fall risk.  Equipment: RW with 5" wheels  Reasons for discharge: treatment goals met and discharge from hospital  Patient/family agrees with progress made and goals achieved: Yes  First skilled PT tx focused on functional mobility training, gait and stairs, and NMR. Family training reinforced with JLeveda Annafor gait and stairs. Discussed CVA risk reduction with pt and fall risk reduction with pt/family, who verbalized understanding.  Pt propelled WC x150' with S and increased time required, cues for stroke efficiency.  Performed basic and furniture transfers in apartment, squat-pivot x5 with close S and cues for safety/hand placement.  Pt able to perform bed mobility Mod I with increased time required, rolling technique.   Gait training in home/carptered setting x30' with min A and in controlled setting x160' with Min A and cues for R foot clearance and step height. Pt able to adjust gait pattern with cues, but R foot continues scuffing  with fatigue; toe cap helps greatly. Pt needs cues for posture and to slow during turns. Daughter demonstrated good guarding technique 2x25' with cues as well.   Performed NMR in sitting for LAQ and marching to increase motor activation for increased R step in gait. NMR performed throughout for increased extensor activation during transfers, pt choosing to place UEs on LEs for increased LE demand. NMR on Nustep x10 min with buil IE and LE, level 4   Stair training x5 with R rail sideways with min A for steadying, especially with descent. Daughter able to retrun-demo as well with min cues for optimal positioning. [facility]   _______________________________ Second tx focused on advanced NMR, functional mobility, and gait training. Pt VERY fatigued after day of therapies, so needed extra rest breaks; discussed progress towards LTGs, follow-up PT and home safety.   NMR Pt challenged to propell WC x100' with bil LEs only (cushion removed). Pt needed cues for RLE attention and pattern, and was limited by fatigue.   Performed the following tasks in standing for balance training and NMR:  -Static stance x262m without UE support - Static stance with eyes closed x10 sec - lateral weight shifts without support (pt had 1 uncontrolled descent to [facility] to posterior LOB)  - mini-squats x10 - lateral walking at hall rail to R side x10' - [place] for weight shifting and RLE ext x5m50mat 60cm/sec  Gait training  - advanced gait with bil UE support on therapist UEs x30' with Min>>up to Max A due to fatigue.  - gait with RW and mIn A x150' in controlled setting for steadying, cues for foot clearance.  - Curb  step training with pt challenged to teach back with Min A and min cues needed for optimal positioning.   Performed multiple sit<>stand and stand-step transfers with close S and cues for technique from low surfaces (increased anterior translation and needing to use UEs at  times. Bed mobility Mod I with rails.   Pt reports having no further questions or concerns before DC. Handoff to rec therapy   PT Discharge Precautions/Restrictions Precautions Precautions: Fall Precaution Comments: R-sided weakness LE.UE Restrictions Weight Bearing Restrictions: No Pain Pain Assessment Pain Assessment: No/denies pain Vision/Perception  Vision - History Baseline Vision: Wears glasses only for reading Visual History: Other (comment) (CAteracts removed) Patient Visual Report: No change from baseline Vision - Assessment Eye Alignment: Within Functional Limits Perception Perception: Impaired Inattention/Neglect: Does not attend to right side of body;Other (comment) (Improved since eval, and pt is functional, but still present) Comments: WFL Praxis Praxis: Intact  Cognition Overall Cognitive Status: History of cognitive impairments - at baseline Arousal/Alertness: Awake/alert Orientation Level: Oriented X4 Attention: Selective Sustained Attention: Appears intact Sustained Attention Impairment: Verbal basic;Functional basic Selective Attention: Appears intact Selective Attention Impairment: Verbal basic;Functional basic Awareness: Impaired Awareness Impairment: Emergent impairment Problem Solving: Impaired Safety/Judgment: Appears intact Comments: Pt continues to need S for Attention to complex tasks, memory, and awareness, but is functional and safe at this level.  Sensation Sensation Light Touch: Appears Intact Stereognosis: Appears Intact Hot/Cold: Appears Intact Proprioception: Appears Intact Coordination Gross Motor Movements are Fluid and Coordinated: No Fine Motor Movements are Fluid and Coordinated: Yes Coordination and Movement Description: Pt with decreased RLE coordination due to decreased motor control and weakness Finger Nose Finger Test: 10x in ten seconds on BUEs Motor  Motor Motor: Hemiplegia;Motor impersistence Motor - Skilled  Clinical Observations: Pt with decreased activation of RLE extensor groups, especally with fatigue.  Motor - Discharge Observations: Pt has made good progress with motor recovery, but is still limited by decreased timing and accuracy of coordination  Mobility Bed Mobility Bed Mobility: Supine to Sit;Sit to Supine Supine to Sit: 6: Modified independent (Device/Increase time) Sit to Supine: 6: Modified independent (Device/Increase time) Transfers Transfers: Yes Stand Pivot Transfers: 5: Supervision Stand Pivot Transfer Details (indicate cue type and reason): S cues for safety  Locomotion  Ambulation Ambulation: Yes Ambulation/Gait Assistance: 4: Min guard Ambulation Distance (Feet): 160 Feet Assistive device: Rolling walker Ambulation/Gait Assistance Details: Pt continues to need min-guard A during turns, distracting environments, and due to R toe drag with fatigue. Pt responds well to verbal cues in the moment.  Stairs / Additional Locomotion Stairs: Yes Stairs Assistance: 4: Min assist Stairs Assistance Details (indicate cue type and reason): Steadying assist and cues for safe foot placement Stair Management Technique: One rail Left;Step to pattern;Sideways Number of Stairs: 5 Height of Stairs: 6 Wheelchair Mobility Wheelchair Mobility: Yes Wheelchair Assistance: 5: Careers information officer: Both upper extremities Wheelchair Parts Management: Needs assistance Distance: 160  Trunk/Postural Assessment  Cervical Assessment Cervical Assessment: Within Functional Limits Thoracic Assessment Thoracic Assessment: Within Functional Limits Lumbar Assessment Lumbar Assessment: Within Functional Limits Postural Control Postural Control: Within Functional Limits Trunk Control: Trunk control is functional in sitting and standing Righting Reactions: Improved, but still delayed righting reactions Postural Limitations: No longer leans in sitting/standing  Balance Balance Balance  Assessed: Yes Static Standing Balance Static Standing - Balance Support: No upper extremity supported;During functional activity Static Standing - Level of Assistance: 5: Stand by assistance Dynamic Standing Balance Dynamic Standing - Balance Support: Right upper extremity supported;Left upper extremity  supported;During functional activity Dynamic Standing - Level of Assistance: 4: Min assist Dynamic Standing - Balance Activities: Lateral lean/weight shifting;Reaching for objects Extremity Assessment      RLE Assessment RLE Assessment: Exceptions to [facility] RLE Strength RLE Overall Strength: Deficits Right Hip Flexion: 3+/5 Right Knee Flexion: 3-/5 Right Knee Extension: 3+/5 Right Ankle Dorsiflexion: 3/5 Right Ankle Plantar Flexion: 2/5 RLE Tone RLE Tone: Within Functional Limits LLE Assessment LLE Assessment: Within Functional Limits  See FIM for current functional status Kennieth Rad, PT, DPT  10/20/2013, 12:15 PM

## 2013-10-20 NOTE — Progress Notes (Signed)
[place] PHYSICAL MEDICINE & REHABILITATION     PROGRESS NOTE 73 year old female with history of HTN, diabetes (off medications), right breast mass, ongoing tobacco use who was admitted to [facility] on 09/27/13 with right sided weakness. Blood pressure 240/100 at admission. CT head without acute changes. MRI brain done revealing acute lacunar infarct left frontal lobe peri-Rolandic white matter, moderate to severe underlying chronic small vessel disease. 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 Cardiac echo with EF 50-60% with no wall abnormality, trace MVR. She was evaluated by Dr. Irish Elders who recommended full strength ASA for thrombotic stroke due to uncontrolled HTN, tobacco use and diabetes    Subjective/Complaints: Off IVF, denies HA or dizziness Intake100% Discussed BP,improved   A  review of systems has been performed and if not noted above is otherwise negative.   Objective: Vital Signs: Blood pressure 141/51, pulse 73, temperature 97.8 F (36.6 C), temperature source Oral, resp. rate 18, height 5' (1.524 m), weight 55.248 kg (121 lb 12.8 oz), SpO2 97.00%. No results found. No results found for this basename: WBC, HGB, HCT, PLT,  in the last 72 hours No results found for this basename: NA, K, CL, CO, GLUCOSE, BUN, CREATININE, CALCIUM,  in the last 72 hours CBG (last 3)   Recent Labs  10/19/13 1646 10/20/13 0023 10/20/13 0723  GLUCAP 108* 112* 132*    Wt Readings from Last 3 Encounters:  10/14/13 55.248 kg (121 lb 12.8 oz)  10/02/13 57.607 kg (127 lb)    Physical Exam:  Nursing note and vitals reviewed.  Constitutional: She is oriented to person, place, and time. She appears well-developed and well-nourished.   HENT: Oral mucosa pink and moist  Head: Normocephalic and atraumatic.  Eyes: Conjunctivae are normal. Pupils are equal, round, and reactive to light.  Neck: Normal range of motion. Neck supple.   Cardiovascular: Normal rate and regular rhythm.  Respiratory: Effort normal. . She has no rales. She exhibits no tenderness.  GI: Soft. Bowel sounds are normal. She exhibits no distension. There is no tenderness.  Musculoskeletal: She exhibits no edema and no tenderness.  Neurological: She is alert and oriented to person, place, and time. Sensory intake Speech clear. Able to follow two step commands without difficulty. Right hemiparesis--deltoid 4/5, biceps/triceps 4/5, intrinsics 4/5, HF 4-/5, KE 4-/5, PF 3-/5, DF 2-. Skin: Skin is warm and dry. On ext, no evidence of stasis dermatitis or heel breakdown Psychiatric: She has a normal mood and affect. Her behavior is normal. Judgment and thought content normal   Assessment/Plan: 1. Functional deficits secondary to left frontal infarct which require 3+ hours per day of interdisciplinary therapy in a comprehensive inpatient rehab setting. Physiatrist is providing close team supervision and 24 hour management of active medical problems listed below. Physiatrist and rehab team continue to assess barriers to discharge/monitor patient progress toward functional and medical goals.  FIM: FIM - Bathing Bathing Steps Patient Completed: Chest;Right Arm;Left Arm;Abdomen;Front perineal area;Buttocks;Right upper leg;Left upper leg;Right lower leg (including foot);Left lower leg (including foot) Bathing: 5: Supervision: Safety issues/verbal cues  FIM - Upper Body Dressing/Undressing Upper body dressing/undressing steps patient completed: Thread/unthread right bra strap;Thread/unthread left bra strap;Thread/unthread right sleeve of pullover shirt/dresss;Thread/unthread left sleeve of pullover shirt/dress;Put head through opening of pull over shirt/dress;Pull shirt over trunk;Hook/unhook bra Upper body dressing/undressing: 5: Set-up assist to: Obtain clothing/put away FIM - Lower Body Dressing/Undressing Lower body dressing/undressing steps patient completed:  Thread/unthread right underwear leg;Thread/unthread left underwear  leg;Thread/unthread right pants leg;Thread/unthread left pants leg;Pull pants up/down;Don/Doff left sock;Don/Doff right sock;Pull underwear up/down;Don/Doff right shoe;Don/Doff left shoe;Fasten/unfasten right shoe;Fasten/unfasten left shoe Lower body dressing/undressing: 5: Supervision: Safety issues/verbal cues  FIM - Toileting Toileting steps completed by patient: Adjust clothing prior to toileting;Performs perineal hygiene;Adjust clothing after toileting Toileting Assistive Devices: Grab bar or rail for support Toileting: 4: Steadying assist  FIM - Radio producer Devices: Grab bars;Walker Toilet Transfers: 4-To toilet/BSC: Min A (steadying Pt. > 75%);4-From toilet/BSC: Min A (steadying Pt. > 75%)  FIM - Control and instrumentation engineer Devices: Walker;Arm rests Bed/Chair Transfer: 5: Supine > Sit: Supervision (verbal cues/safety issues);5: Sit > Supine: Supervision (verbal cues/safety issues);4: Bed > Chair or W/C: Min A (steadying Pt. > 75%);4: Chair or W/C > Bed: Min A (steadying Pt. > 75%)  FIM - Locomotion: Wheelchair Distance: 150 Locomotion: Wheelchair: 1: Total Assistance/staff pushes wheelchair (Pt<25%) FIM - Locomotion: Ambulation Locomotion: Ambulation Assistive Devices: Administrator Ambulation/Gait Assistance: 4: Min guard;4: Min assist Locomotion: Ambulation: 2: Travels 50 - 149 ft with minimal assistance (Pt.>75%)  Comprehension Comprehension Mode: Auditory Comprehension: 5-Understands complex 90% of the time/Cues < 10% of the time  Expression Expression Mode: Verbal Expression: 5-Expresses complex 90% of the time/cues < 10% of the time  Social Interaction Social Interaction Mode: Asleep Social Interaction: 5-Interacts appropriately 90% of the time - Needs monitoring or encouragement for participation or interaction.  Problem Solving Problem Solving  Mode: Asleep Problem Solving: 5-Solves basic 90% of the time/requires cueing < 10% of the time  Memory Memory Mode: Asleep Memory: 5-Recognizes or recalls 90% of the time/requires cueing < 10% of the time  Medical Problem List and Plan:  1. 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 DVT Prophylaxis/Anticoagulation: Pharmaceutical: Lovenox  3. Pain Management: N/A  4. Depression/Mood: continue lexapro  5. Neuropsych: This patient is capable of making decisions on her own behalf.  6. Skin/Wound Care: Routine pressure relief measures                              7. HTN: Blood pressures trending upward and medications resumed today.allow around 160 sys but starting to increase meds worse in am, Norvasc just increased Will monitor every 8 hours cardura added at hs to address am elevated SBP 8. Diabetes: No medications for past 1-2 years--reports she lost a lot of weight and last hgb A1c around 5. Will check hgb A1c. Fasting BS 108-135 range.  -dietary ed  9. Dyslipidemia: Continue lipitor.  10.  Reflux symptoms appear to be improving on Pepcid 11.  Acute renal insufficiency-off ACE, recheck BMET improving     LOS (Days) 18 A FACE TO FACE EVALUATION WAS PERFORMED  Candace Monroe 10/20/2013 7:49 AM

## 2013-10-20 NOTE — Progress Notes (Signed)
Recreational Therapy Session Note  Patient Details  Name: Candace Monroe MRN: [id number] Date of Birth: 09/26/1940 Today's Date: 10/20/2013  Pain: no c/o Skilled Therapeutic Interventions/Progress Updates: Pt c/o fatigue after finishing afternoon PT session & in bed upon arrival.  Pt is supervision level for simple tasks at bed/seated level needing set up assist & occasional min cues.  Discharge planning with pt about use of leisure time & any remaining concerns.  Pt states she is excited to return home with her family & feels prepared.  Therapy/Group: Individual Therapy   Emyah Roznowski 10/20/2013, 4:14 PM

## 2013-10-21 LAB — GLUCOSE, CAPILLARY: Glucose-Capillary: 122 mg/dL — ABNORMAL HIGH (ref 70–99)

## 2013-10-21 MED ORDER — AMLODIPINE BESYLATE 10 MG PO TABS: 10.0000 mg | ORAL_TABLET | Freq: Every day | ORAL | Status: DC

## 2013-10-21 MED ORDER — ESCITALOPRAM OXALATE 10 MG PO TABS: 10.0000 mg | ORAL_TABLET | Freq: Every day | ORAL | Status: DC

## 2013-10-21 MED ORDER — SENNOSIDES-DOCUSATE SODIUM 8.6-50 MG PO TABS: 2.0000 | ORAL_TABLET | Freq: Every day | ORAL | Status: DC

## 2013-10-21 MED ORDER — SIMVASTATIN 20 MG PO TABS: 20.0000 mg | ORAL_TABLET | Freq: Every day | ORAL | Status: DC

## 2013-10-21 MED ORDER — MUSCLE RUB 10-15 % EX CREA: 1.0000 "application " | TOPICAL_CREAM | Freq: Two times a day (BID) | CUTANEOUS | Status: DC

## 2013-10-21 MED ORDER — FAMOTIDINE 20 MG PO TABS
20.0000 mg | ORAL_TABLET | Freq: Every day | ORAL | Status: DC
Start: 2013-10-21 — End: 2014-11-12

## 2013-10-21 MED ORDER — DOXAZOSIN MESYLATE 2 MG PO TABS: 2.0000 mg | ORAL_TABLET | Freq: Every day | ORAL | Status: DC

## 2013-10-21 MED ORDER — NICOTINE 14 MG/24HR TD PT24: 14.0000 mg | MEDICATED_PATCH | Freq: Every day | TRANSDERMAL | Status: DC

## 2013-10-21 NOTE — Progress Notes (Signed)
Social Work Discharge Note Discharge Note  The overall goal for the admission was met for:   Discharge location: [place] TO ASSIST-24 HR  Length of Stay: Yes-18 DAYS  Discharge activity level: Yes-MIN LEVEL  Home/community participation: Yes  Services provided included: MD, RD, PT, OT, SLP, RN, CM, TR, Pharmacy and SW  Financial Services: Private Insurance: [facility]  Follow-up services arranged: Home Health: [facility]-PT,OT,RN, DME: [facility], [facility], B[facility] and Patient/Family has no preference for HH/DME agencies  Comments (or additional information):FAMILY Hoskins, [place].  DAUGHTER IS HER MAIN CAREGIVER  Patient/Family verbalized understanding of follow-up arrangements: Yes  Individual responsible for coordination of the follow-up plan: RUSSELL-HUSBAND & DAUGHTER  Confirmed correct DME delivered: Elease Hashimoto 10/21/2013    Elease Hashimoto

## 2013-10-21 NOTE — Discharge Summary (Signed)
Physician Discharge Summary  Patient ID: Candace Monroe MRN: [id number] DOB/AGE: [birth year] 73 y.o.  Admit date: 10/02/2013 Discharge date: 10/21/2013  Discharge Diagnoses:  Principal Problem:   CVA (cerebral infarction) Active Problems:   Spastic hemiparesis affecting dominant side   HTN (hypertension)   Depression   Gout flare   Discharged Condition: Stable  Significant Diagnostic Studies: No results found.  Labs:  Basic Metabolic Panel:  Recent Labs Lab 10/15/13 0615 10/17/13 0510  NA 139 140  K 4.3 4.5  CL 105 106  CO2 21 22  GLUCOSE 115* 117*  BUN 31* 27*  CREATININE 1.25* 1.20*  CALCIUM 8.9 8.9    CBC: CBC Latest Ref Rng 10/05/2013  WBC 4.0 - 10.5 K/uL 9.5  Hemoglobin 12.0 - 15.0 g/dL 14.4  Hematocrit 36.0 - 46.0 % 42.0  Platelets 150 - 400 K/uL 205     CBG:  Recent Labs Lab 10/20/13 0723 10/20/13 1146 10/20/13 1635 10/20/13 2100 10/21/13 0701  GLUCAP 132* 101* 122* 122* 122*    Brief HPI:   Candace Monroe is s 73 year old female with history of HTN, diabetes (off medications), right breast mass, ongoing tobacco use who was admitted to [facility] on 09/27/13 with right sided weakness. Blood pressure 240/100 at admission. CT head without acute changes. MRI brain done revealing acute lacunar infarct left frontal lobe peri-Rolandic white matter, moderate to severe underlying chronic small vessel disease. 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 Cardiac echo with EF 50-60% with no wall abnormality, trace MVR. She was evaluated by Dr. Irish Elders who recommended full strength ASA for thrombotic stroke due to uncontrolled HTN, tobacco use and diabetes. 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 Therapy initiated and CIR recommended by rehab team.    Hospital Course: Candace Monroe was admitted to  rehab 10/02/2013 for inpatient therapies to consist of PT, ST and OT at least three hours five days a week. Past admission physiatrist, therapy team and rehab RN have worked together to provide customized collaborative inpatient rehab. She was maintained on ASA for stroke prophylaxis. Admission labs revealed renal insufficiency with BUN/Cr at 40/1.3. Creatinine continued to rise to 1.44 despite attempts to improve  fluid intake therefore ACE was discontinued. She was treated with IVF briefly and renal status has continued to improve.    Low dose Cardura was added to Norvasc with improvement in blood pressure control. She reported right foot pain due to gout flare. Uric acid levels were elevated at 7.6 and she was treated with short course of colchicine with resolution of symptoms.  Patient reported history of borderline diabetes and blood sugars were monitored on ac/hs basis  due to Hgb A1c-6.2. She shows evidence of impaired glucose tolerance and was educated on carb modification in addition to heart healthy diet. She has made steady progress during her rehab stay but continues to require min assist with cues due to right inattention as well as right hemiparesis. She will continue to receive follow up PT, OT and RN by [facility] past discharge.   Rehab course: During patient's stay in rehab weekly team conferences were held to monitor patient's progress, set goals and discuss barriers to discharge. Patient has had improvement in activity tolerance, balance, postural control, as well as ability to compensate for deficits. She is has had improvement in functional use RUE  and RLE as  well as improvement in awareness, attention and coordination. She requires supervision for bathing and dressing tasks. She is modified independent for bed mobility, requires close supervision for transfers and is ambulating 160 feet at min assist level with cues for foot clearance.    Disposition: 01-Home or Self Care  Diet:   Cardiac diet.   Special Instructions: 1. Have Dr. [name] refer you to neurology in [facility] for follow up past stroke.  2. Drink plenty of fluids daily.      Medication List    STOP taking these medications       benazepril 20 MG tablet  Commonly known as:  LOTENSIN      TAKE these medications       amitriptyline 10 MG tablet  Commonly known as:  ELAVIL  Take 10 mg by mouth at bedtime as needed for sleep.     amLODipine 10 MG tablet  Commonly known as:  NORVASC  Take 1 tablet (10 mg total) by mouth daily.     doxazosin 2 MG tablet  Commonly known as:  CARDURA  Take 1 tablet (2 mg total) by mouth at bedtime.     escitalopram 10 MG tablet  Commonly known as:  LEXAPRO  Take 1 tablet (10 mg total) by mouth daily.     famotidine 20 MG tablet  Commonly known as:  PEPCID  Take 1 tablet (20 mg total) by mouth at bedtime.     fluticasone 50 MCG/ACT nasal spray  Commonly known as:  FLONASE  Place 1 spray into both nostrils daily.     MUSCLE RUB 10-15 % Crea  Apply 1 application topically 2 (two) times daily.     nicotine 14 mg/24hr patch  Commonly known as:  NICODERM CQ - dosed in mg/24 hours  Place 1 patch (14 mg total) onto the skin daily.     senna-docusate 8.6-50 MG per tablet  Commonly known as:  Senokot-S  Take 2 tablets by mouth at bedtime.     simvastatin 20 MG tablet  Commonly known as:  ZOCOR  Take 1 tablet (20 mg total) by mouth daily at 6 PM.           Follow-up Information   Follow up with Charlett Blake, MD On 11/13/2013. (Be there at 3:00  for 3:30 pm   appointment )    Specialty:  Physical Medicine and Rehabilitation   Contact information:   [place] [id number] [phone]       Follow up with Carmon Ginsberg, MD On 10/28/2013. (APPT @ 60;00 AM)    Specialty:  Family Medicine   Contact information:   [address] [place] [address] [phone]       Signed: Bary Leriche 10/21/2013, 9:31  AM

## 2013-10-21 NOTE — Progress Notes (Addendum)
[facility] PHYSICAL MEDICINE & REHABILITATION     PROGRESS NOTE 73 year old female with history of HTN, diabetes (off medications), right breast mass, ongoing tobacco use who was admitted to [facility] on 09/27/13 with right sided weakness. Blood pressure 240/100 at admission. CT head without acute changes. MRI brain done revealing acute lacunar infarct left frontal lobe peri-Rolandic white matter, moderate to severe underlying chronic small vessel disease. 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 Cardiac echo with EF 50-60% with no wall abnormality, trace MVR. She was evaluated by Dr. Irish Elders who recommended full strength ASA for thrombotic stroke due to uncontrolled HTN, tobacco use and diabetes    Subjective/Complaints: Off IVF, denies HA or dizziness Husband at bedside Discussed BP,improved   A  review of systems has been performed and if not noted above is otherwise negative.   Objective: Vital Signs: Blood pressure 155/49, pulse 68, temperature 97.6 F (36.4 C), temperature source Oral, resp. rate 18, height 5' (1.524 m), weight 55.248 kg (121 lb 12.8 oz), SpO2 100.00%. No results found. No results found for this basename: WBC, HGB, HCT, PLT,  in the last 72 hours No results found for this basename: NA, K, CL, CO, GLUCOSE, BUN, CREATININE, CALCIUM,  in the last 72 hours CBG (last 3)   Recent Labs  10/20/13 1635 10/20/13 2100 10/21/13 0701  GLUCAP 122* 122* 122*    Wt Readings from Last 3 Encounters:  10/14/13 55.248 kg (121 lb 12.8 oz)  10/02/13 57.607 kg (127 lb)    Physical Exam:  Nursing note and vitals reviewed.  Constitutional: She is oriented to person, place, and time. She appears well-developed and well-nourished.   HENT: Oral mucosa pink and moist  Head: Normocephalic and atraumatic.  Eyes: Conjunctivae are normal. Pupils are equal, round, and reactive to light.  Neck: Normal range of motion. Neck supple.   Cardiovascular: Normal rate and regular rhythm.  Respiratory: Effort normal. . She has no rales. She exhibits no tenderness.  GI: Soft. Bowel sounds are normal. She exhibits no distension. There is no tenderness.  Musculoskeletal: She exhibits no edema and no tenderness.  Neurological: She is alert and oriented to person, place, and time. Sensory intake Speech clear. Able to follow two step commands without difficulty. Right hemiparesis--deltoid 4/5, biceps/triceps 4/5, intrinsics 4/5, HF 4-/5, KE 4-/5, PF 3-/5, DF 2-. Skin: Skin is warm and dry. On ext, no evidence of stasis dermatitis or heel breakdown Psychiatric: She has a normal mood and affect. Her behavior is normal. Judgment and thought content normal   Assessment/Plan: 1. Functional deficits secondary to left frontal infarct  Stable for D/C today F/u PCP in 1-2 weeks F/u PM&R 3 weeks See D/C summary See D/C instructions  FIM: FIM - Bathing Bathing Steps Patient Completed: Chest;Right Arm;Left Arm;Abdomen;Front perineal area;Buttocks;Right upper leg;Left upper leg;Right lower leg (including foot);Left lower leg (including foot) Bathing: 5: Supervision: Safety issues/verbal cues  FIM - Upper Body Dressing/Undressing Upper body dressing/undressing steps patient completed: Thread/unthread right bra strap;Thread/unthread left bra strap;Thread/unthread right sleeve of pullover shirt/dresss;Thread/unthread left sleeve of pullover shirt/dress;Put head through opening of pull over shirt/dress;Pull shirt over trunk;Hook/unhook bra Upper body dressing/undressing: 5: Set-up assist to: Obtain clothing/put away FIM - Lower Body Dressing/Undressing Lower body dressing/undressing steps patient completed: Thread/unthread right underwear leg;Thread/unthread left underwear leg;Thread/unthread right pants leg;Thread/unthread left pants leg;Pull pants up/down;Don/Doff left sock;Don/Doff right sock;Pull underwear up/down;Don/Doff right shoe;Don/Doff  left shoe;Fasten/unfasten right shoe;Fasten/unfasten left shoe Lower body dressing/undressing:  5: Supervision: Safety issues/verbal cues  FIM - Toileting Toileting steps completed by patient: Adjust clothing prior to toileting;Performs perineal hygiene;Adjust clothing after toileting Toileting Assistive Devices: Grab bar or rail for support Toileting: 4: Steadying assist  FIM - Radio producer Devices: Grab bars;Walker Toilet Transfers: 4-To toilet/BSC: Min A (steadying Pt. > 75%);4-From toilet/BSC: Min A (steadying Pt. > 75%)  FIM - Control and instrumentation engineer Devices: Walker;Arm rests Bed/Chair Transfer: 6: Supine > Sit: No assist;6: Sit > Supine: No assist;5: Bed > Chair or W/C: Supervision (verbal cues/safety issues);5: Chair or W/C > Bed: Supervision (verbal cues/safety issues)  FIM - Locomotion: Wheelchair Distance: 160 Locomotion: Wheelchair: 5: Travels 150 ft or more: maneuvers on rugs and over door sills with supervision, cueing or coaxing FIM - Locomotion: Ambulation Locomotion: Ambulation Assistive Devices: Administrator Ambulation/Gait Assistance: 4: Min guard Locomotion: Ambulation: 4: Travels 150 ft or more with minimal assistance (Pt.>75%)  Comprehension Comprehension Mode: Auditory Comprehension: 5-Follows basic conversation/direction: With no assist  Expression Expression Mode: Verbal Expression: 5-Expresses basic needs/ideas: With no assist  Social Interaction Social Interaction Mode: Asleep Social Interaction: 5-Interacts appropriately 90% of the time - Needs monitoring or encouragement for participation or interaction.  Problem Solving Problem Solving Mode: Asleep Problem Solving: 5-Solves basic 90% of the time/requires cueing < 10% of the time  Memory Memory Mode: Asleep Memory: 5-Recognizes or recalls 90% of the time/requires cueing < 10% of the time  Medical Problem List and Plan:  1. Functional  deficits secondary to thrombotic Left frontal lobe peri-rolandic lacunar infarct  2. DVT Prophylaxis/Anticoagulation: Pharmaceutical: Lovenox  3. Pain Management: N/A  4. Depression/Mood: continue lexapro  5. Neuropsych: This patient is capable of making decisions on her own behalf.  6. Skin/Wound Care: Routine pressure relief measures                              7. HTN: Blood pressures trending upward and medications resumed today.allow around 160 sys but starting to increase meds worse in am, Norvasc just increased Will monitor every 8 hours cardura added at hs to address am elevated SBP 8. Diabetes: No medications for past 1-2 years--reports she lost a lot of weight and last hgb A1c around 5. Will check hgb A1c. Fasting BS 108-135 range.  -dietary ed  9. Dyslipidemia: Continue lipitor.  10.  Reflux symptoms appear to be improving on Pepcid 11.  Acute renal insufficiency-off ACE,      LOS (Days) 19 A FACE TO FACE EVALUATION WAS PERFORMED  KIRSTEINS,ANDREW E 10/21/2013 7:29 AM

## 2013-10-21 NOTE — Progress Notes (Signed)
Pt discharged to home with family at 30. Discharge instructions given to husband and pt with verbal understanding.

## 2013-10-21 NOTE — Discharge Instructions (Signed)
Inpatient Rehab Discharge Instructions  BELLAMIE CAPOTOSTO Discharge date and time:  10/21/13  Activities/Precautions/ Functional Status: Activity: activity as tolerated with supervision Diet: cardiac diet Wound Care: none needed  Functional status:  ___ No restrictions     ___ Walk up steps independently _X__ 24/7 supervision/assistance   ___ Walk up steps with assistance ___ Intermittent supervision/assistance  ___ Bathe/dress independently ___ Walk with walker     _X__ Bathe/dress with assistance ___ Walk Independently    ___ Shower independently _X__ Walk with assistance    ___ Shower with assistance _X__ No alcohol     ___ Return to work/school ________  Special Instructions: 1. Have Dr. [name] refer you to neurology in [facility] for follow up past stroke.  2. Drink plenty of fluids daily.    STROKE/TIA DISCHARGE INSTRUCTIONS SMOKING Cigarette smoking nearly doubles your risk of having a stroke & is the single most alterable risk factor  If you smoke or have smoked in the last 12 months, you are advised to quit smoking for your health.  Most of the excess cardiovascular risk related to smoking disappears within a year of stopping.  Ask you doctor about anti-smoking medications  [place] Quit Line: 1-800-QUIT NOW  Free Smoking Cessation Classes [phone]  CHOLESTEROL Know your levels; limit fat & cholesterol in your diet  Lipid Panel  Chol:  203  HDL:  32 LDL:  133  Trig:   189     Many patients benefit from treatment even if their cholesterol is at goal.  Goal: Total Cholesterol (CHOL) less than 160  Goal:  Triglycerides (TRIG) less than 150  Goal:  HDL greater than 40  Goal:  LDL (LDLCALC) less than 100   BLOOD PRESSURE American Stroke Association blood pressure target is less that 120/80 mm/Hg  Your discharge blood pressure is:  BP: 155/49 mmHg  Monitor your blood pressure  Limit your salt and alcohol intake  Many individuals will require more than one  medication for high blood pressure  DIABETES (A1c is a blood sugar average for last 3 months) Goal HGBA1c is under 7% (HBGA1c is blood sugar average for last 3 months)  Diabetes: borderline diabetes    Lab Results  Component Value Date   HGBA1C 6.2* 10/05/2013     Your HGBA1c can be lowered with medications, healthy diet, and exercise.  Check your blood sugar as directed by your physician  Call your physician if you experience unexplained or low blood sugars.  PHYSICAL ACTIVITY/REHABILITATION Goal is 30 minutes at least 4 days per week  Activity: No driving, Therapies: See above Return to work: N/A  Activity decreases your risk of heart attack and stroke and makes your heart stronger.  It helps control your weight and blood pressure; helps you relax and can improve your mood.  Participate in a regular exercise program.  Talk with your doctor about the best form of exercise for you (dancing, walking, swimming, cycling).  DIET/WEIGHT Goal is to maintain a healthy weight  Your discharge diet is: Heart Healthy Your height is:  Height: 5' (152.4 cm) Your current weight is: Weight: 55.248 kg (121 lb 12.8 oz) Your Body Mass Index (BMI) is:  BMI (Calculated): 23.3  Following the type of diet specifically designed for you will help prevent another stroke.  Your are at goal weight   Your goal Body Mass Index (BMI) is 19-24.  Healthy food habits can help reduce 3 risk factors for stroke:  High cholesterol, hypertension, and excess  weight.  RESOURCES Stroke/Support Group:  Call [phone]   STROKE EDUCATION PROVIDED/REVIEWED AND GIVEN TO PATIENT Stroke warning signs and symptoms How to activate emergency medical system (call 911). Medications prescribed at discharge. Need for follow-up after discharge. Personal risk factors for stroke. Pneumonia vaccine given:  Flu vaccine given:  My questions have been answered, the writing is legible, and I understand these instructions.  I will  adhere to these goals & educational materials that have been provided to me after my discharge from the hospital.      [place]:    Home Health:   PT, OT, [place] [id number] Date of last service:10/21/2013   Medical Equipment/Items Ordered:ROLLING Guy Franco COMMODE, [place] RESOURCES FOR PATIENT/FAMILY: Support Groups:[facility] SUPPORT GROUP  My questions have been answered and I understand these instructions. I will adhere to these goals and the provided educational materials after my discharge from the hospital.  Patient/Caregiver Signature _______________________________ Date __________  Clinician Signature _______________________________________ Date __________  Please bring this form and your medication list with you to all your follow-up doctor's appointments.

## 2013-11-02 LAB — HM MAMMOGRAPHY: HM MAMMO: NORMAL

## 2013-11-03 DIAGNOSIS — F329 Major depressive disorder, single episode, unspecified: Secondary | ICD-10-CM | POA: Diagnosis not present

## 2013-11-03 DIAGNOSIS — I10 Essential (primary) hypertension: Secondary | ICD-10-CM | POA: Diagnosis not present

## 2013-11-03 DIAGNOSIS — I69351 Hemiplegia and hemiparesis following cerebral infarction affecting right dominant side: Secondary | ICD-10-CM | POA: Diagnosis not present

## 2013-11-12 ENCOUNTER — Other Ambulatory Visit: Payer: Medicare Other

## 2013-11-12 ENCOUNTER — Encounter: Payer: Self-pay | Admitting: General Surgery

## 2013-11-12 ENCOUNTER — Ambulatory Visit (INDEPENDENT_AMBULATORY_CARE_PROVIDER_SITE_OTHER): Payer: Medicare Other | Admitting: General Surgery

## 2013-11-12 VITALS — BP 116/72 | HR 68 | Resp 16 | Ht 60.0 in | Wt 123.0 lb

## 2013-11-12 DIAGNOSIS — N63 Unspecified lump in unspecified breast: Secondary | ICD-10-CM

## 2013-11-12 DIAGNOSIS — R928 Other abnormal and inconclusive findings on diagnostic imaging of breast: Secondary | ICD-10-CM

## 2013-11-12 NOTE — Patient Instructions (Signed)
The patient is aware to call back for any questions or concerns.  

## 2013-11-12 NOTE — Progress Notes (Signed)
Patient ID: Candace Monroe, female   DOB: 05/31/1940, 73 y.o.   MRN: [id number]  Chief Complaint  Patient presents with  . Other    mammogram     HPI Candace Monroe is a 73 y.o. female.  who presents for a breast evaluation. The most recent mammogram was done on 09-23-13. She had a left mammogram and ultrasound 09-25-13.  Patient does perform regular self breast checks and gets regular mammograms done.  She denies any problems with her breasts at this time.   There was a delay in the patient's evaluation as she suffered a stroke shortly after her mammograms were completed. She did her rehabilitation in [place]. She's made a good recovery. She is accompanied today by her daughter.  HPI  Past Medical History  Diagnosis Date  . HTN (hypertension)   . Depression   . Breast mass   . Stroke Sept-Oct 2015    Past Surgical History  Procedure Laterality Date  . Tonsillectomy    . Abdominal hysterectomy  1973  . Cholecystectomy  1990    Family History  Problem Relation Age of Onset  . Cancer Sister     breast  . Diabetes Brother   . Stroke Mother   . Seizures Sister     Social History History  Substance Use Topics  . Smoking status: Former Smoker -- 4 years    Quit date: 09/01/2013  . Smokeless tobacco: Never Used  . Alcohol Use: No    Allergies  Allergen Reactions  . Ace Inhibitors     Renal insufficiency  . Augmentin [Amoxicillin-Pot Clavulanate] Itching  . Sulfa Antibiotics Itching    Current Outpatient Prescriptions  Medication Sig Dispense Refill  . amitriptyline (ELAVIL) 10 MG tablet Take 1 tablet by mouth as needed.    Marland Kitchen amLODipine (NORVASC) 10 MG tablet Take 1 tablet (10 mg total) by mouth daily. 30 tablet 0  . aspirin 81 MG tablet Take 81 mg by mouth daily.    Marland Kitchen doxazosin (CARDURA) 2 MG tablet Take 1 tablet (2 mg total) by mouth at bedtime. 30 tablet 0  . escitalopram (LEXAPRO) 10 MG tablet Take 1 tablet (10 mg total) by mouth daily. 30 tablet 0  .  famotidine (PEPCID) 20 MG tablet Take 1 tablet (20 mg total) by mouth at bedtime. 30 tablet 0  . fluticasone (FLONASE) 50 MCG/ACT nasal spray Place 1 spray into both nostrils daily.    Marland Kitchen senna-docusate (SENOKOT-S) 8.6-50 MG per tablet Take 2 tablets by mouth at bedtime. 60 tablet 0  . simvastatin (ZOCOR) 20 MG tablet Take 1 tablet (20 mg total) by mouth daily at 6 PM. 30 tablet 0   No current facility-administered medications for this visit.    Review of Systems Review of Systems  Constitutional: Negative.   Respiratory: Negative.   Cardiovascular: Negative.     Blood pressure 116/72, pulse 68, resp. rate 16, height 5' (1.524 m), weight 123 lb (55.792 kg).  Physical Exam Physical Exam  Constitutional: She is oriented to person, place, and time. She appears well-developed and well-nourished.  Eyes: Conjunctivae are normal. No scleral icterus.  Neck: Neck supple.  Cardiovascular: Normal rate, regular rhythm and normal heart sounds.   Pulmonary/Chest: Effort normal and breath sounds normal. Right breast exhibits no inverted nipple, no mass, no nipple discharge, no skin change and no tenderness. Left breast exhibits no inverted nipple, no nipple discharge, no skin change and no tenderness.  Left breast 1 cup size larger than  right.   Abdominal: Soft. Bowel sounds are normal. There is no tenderness.  Lymphadenopathy:    She has no cervical adenopathy.    She has no axillary adenopathy.  Neurological: She is alert and oriented to person, place, and time.  Skin: Skin is warm and dry.    Data Reviewed PCP notes from Carmon Ginsberg, [place]., 08/26/2013 prior to her stroke. Elevated serum creatinine 1.3 with an estimated GFR 39. Normal electrolytes.  Screening mammograms dated 09/23/2013 suggested a possible mass. By red-0.  Focal spot compression views dated 09/25/2013 showed multiple nodular densities in the upper-outer quadrant of the left breast 2 of which were felt to be consistent with  lymph nodes. 2 smaller lesions measuring 3 and 5 mm were indeterminant. BI-RADS-4 Ultrasound examination of the left breast was completed to assess whether the areas of concern were amenable to biopsy. At the 2:00 position 13 cm from the nipple a 0.2 x 0.3 cm hypoechoic area was identified. Fine-needle aspiration was undertaken of this area as well as a slightly larger 0.3 cm hypoechoic area 12.5 cm from the nipple.  Slides 2 were per his for each area. The procedure was well tolerated. Impression likely multiple small lymph nodes.   Assessment    Impression likely multiple small lymph nodes.    Plan    The family will be contacted when FNA results are available. If benign results are obtained, we'll arrange for a follow up mammogram in 6 months.     PCP:  Lelon Huh Ref: Frances Nickels    Robert Bellow 11/13/2013, 9:00 PM

## 2013-11-13 ENCOUNTER — Ambulatory Visit (HOSPITAL_BASED_OUTPATIENT_CLINIC_OR_DEPARTMENT_OTHER): Payer: Medicare Other | Admitting: Physical Medicine & Rehabilitation

## 2013-11-13 ENCOUNTER — Encounter: Payer: Medicare Other | Attending: Physical Medicine & Rehabilitation

## 2013-11-13 ENCOUNTER — Encounter: Payer: Self-pay | Admitting: Physical Medicine & Rehabilitation

## 2013-11-13 VITALS — BP 159/44 | HR 65 | Resp 14 | Ht 60.0 in | Wt 118.0 lb

## 2013-11-13 DIAGNOSIS — G8110 Spastic hemiplegia affecting unspecified side: Secondary | ICD-10-CM | POA: Diagnosis not present

## 2013-11-13 DIAGNOSIS — M4126 Other idiopathic scoliosis, lumbar region: Secondary | ICD-10-CM | POA: Diagnosis present

## 2013-11-13 DIAGNOSIS — M419 Scoliosis, unspecified: Secondary | ICD-10-CM

## 2013-11-13 DIAGNOSIS — R928 Other abnormal and inconclusive findings on diagnostic imaging of breast: Secondary | ICD-10-CM | POA: Insufficient documentation

## 2013-11-13 LAB — FINE-NEEDLE ASPIRATION

## 2013-11-13 NOTE — Progress Notes (Signed)
Subjective:    Patient ID: Candace Monroe, female    DOB: [birth year], 73 y.o.   MRN: [id number] 73 year old female with history of HTN, diabetes (off medications), right breast mass, ongoing tobacco use who was admitted to [facility] on 09/27/13 with right sided weakness. Blood pressure 240/100 at admission. CT head without acute changes. MRI brain done revealing acute lacunar infarct left frontal lobe peri-Rolandic white matter, moderate to severe underlying chronic small vessel disease. 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 Cardiac echo with EF 50-60% with no wall abnormality, trace MVR. She was evaluated by Dr. Irish Elders who recommended full strength ASA for thrombotic stroke due to uncontrolled HTN, tobacco use and diabetes. Patient with resultant right hemiparesis with sensory deficits, impulsivity with poor safety awareness, difficulty weight bearing thorough RLE with pusher syndrome  CIR  Admit date: 10/02/2013 Discharge date: 10/21/2013    HPI  Has followed up with primary care physician  Also followed up with  Surgeon For breast biopsy.. Staying with husband. Denies falls. Using walker to ambulate.Uses ankle-foot orthosis Receiving home health therapy.Dressing and bathing independently No clumsiness in the right hand. No numbness in the right arm denies numbness in the foot or ankle area. Pain Inventory Average Pain 5 Pain Right Now 5 My pain is intermittent, sharp, stabbing and aching  In the last 24 hours, has pain interfered with the following? General activity 5 Relation with others 5 Enjoyment of life 5 What TIME of day is your pain at its worst? comes and goes Sleep (in general) Good  Pain is worse with: walking, bending, standing and some activites Pain improves with: medication Relief from Meds: 6  Mobility use a walker  Function retired  Neuro/Psych tremor trouble walking  Prior Studies Any changes since last  visit?  no  Physicians involved in your care Any changes since last visit?  no   Family History  Problem Relation Age of Onset  . Cancer Sister     breast  . Diabetes Brother   . Stroke Mother   . Seizures Sister    History   Social History  . Marital Status: Married    Spouse Name: N/A    Number of Children: N/A  . Years of Education: N/A   Social History Main Topics  . Smoking status: Former Smoker -- 25 years    Quit date: 09/01/2013  . Smokeless tobacco: Never Used  . Alcohol Use: No  . Drug Use: No  . Sexual Activity: None   Other Topics Concern  . None   Social History Narrative   Past Surgical History  Procedure Laterality Date  . Tonsillectomy    . Abdominal hysterectomy  1973  . Cholecystectomy  1990   Past Medical History  Diagnosis Date  . HTN (hypertension)   . Depression   . Breast mass   . Stroke Sept-Oct 2015   Ht 5' (1.524 m)  Wt 118 lb (53.524 kg)  BMI 23.05 kg/m2   BP 159/44 P65 R 14 O2 98%  Opioid Risk Score:   Fall Risk Score: Low Fall Risk (0-5 points)   Review of Systems  Constitutional: Negative.   HENT: Negative.   Eyes: Negative.   Respiratory: Negative.   Cardiovascular: Negative.   Gastrointestinal: Negative.   Endocrine: Negative.   Genitourinary: Negative.   Musculoskeletal: Positive for back pain.  Skin: Negative.   Allergic/Immunologic: Negative.   Neurological: Positive for tremors.  Trouble walking  Hematological: Negative.   Psychiatric/Behavioral: Negative.        Objective:   Physical Exam  Constitutional: She is oriented to person, place, and time. She appears well-developed and well-nourished.  HENT:  Head: Normocephalic and atraumatic.  Hard of hearing  Eyes: Conjunctivae and EOM are normal. Pupils are equal, round, and reactive to light.  Neck: Normal range of motion.  Musculoskeletal:       Lumbar back: She exhibits decreased range of motion, tenderness and deformity. She exhibits no  spasm.  Mild tenderness to palpation in the lumbar paraspinal muscles bilaterally. Also tenderness over the right PSIS as well as right gluteus medius  No tenderness over the greater trochanter of the hip  Neurological: She is alert and oriented to person, place, and time.  Psychiatric: Her affect is blunt. Her speech is delayed. She is slowed.  Nursing note and vitals reviewed.  Motor strength is 4/5 in the right deltoid, biceps, triceps, grip, hip flexor, knee extensor 3 minus right ankle dorsiflexor and plantar flexor Left side is 5/5 throughout Sensation intact to light touch on right side  Dextroscoliosis lumbar area pain with lateral lean toward the right side. Toe drag on the right side mild, mild hip hiking on the right side Right Carbon fiber anterior AFO with leather soled athletic shoe    Assessment & Plan:  1. 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 She has a good prognosis to progress beyond AFO and walker. Have written orders to extend home health PT.  2. Lumbar scoliosis mild with low back pain that is exacerbated by her gait deviation post CVA

## 2013-11-13 NOTE — Patient Instructions (Signed)
Please call me if he develop any spasms in the right leg area that this could be a late effect of the stroke called spasticity.

## 2013-11-16 ENCOUNTER — Telehealth: Payer: Self-pay | Admitting: General Surgery

## 2013-11-16 LAB — FINE-NEEDLE ASPIRATION

## 2013-11-16 NOTE — Telephone Encounter (Signed)
FNA of both samples showed benign lymph tissue. Will arrange for a six month follow up exam.

## 2014-04-07 ENCOUNTER — Ambulatory Visit
Admit: 2014-04-07 | Disposition: A | Discharge status: Home / Self Care | Payer: Self-pay | Attending: General Surgery | Admitting: General Surgery

## 2014-04-07 DIAGNOSIS — N63 Unspecified lump in breast: Secondary | ICD-10-CM | POA: Diagnosis not present

## 2014-04-12 ENCOUNTER — Encounter: Payer: Self-pay | Admitting: General Surgery

## 2014-04-14 ENCOUNTER — Ambulatory Visit: Payer: Self-pay | Admitting: General Surgery

## 2014-04-20 ENCOUNTER — Ambulatory Visit (INDEPENDENT_AMBULATORY_CARE_PROVIDER_SITE_OTHER): Payer: Medicare Other | Admitting: General Surgery

## 2014-04-20 ENCOUNTER — Encounter: Payer: Self-pay | Admitting: General Surgery

## 2014-04-20 VITALS — BP 122/62 | HR 68 | Resp 12 | Ht 60.0 in | Wt 132.0 lb

## 2014-04-20 DIAGNOSIS — R928 Other abnormal and inconclusive findings on diagnostic imaging of breast: Secondary | ICD-10-CM

## 2014-04-20 NOTE — Progress Notes (Signed)
Patient ID: Candace Monroe, female   DOB: 1940/08/17, 74 y.o.   MRN: [id number]  Chief Complaint  Patient presents with  . Follow-up    mammogram    HPI Candace Monroe is a 74 y.o. female who presents for a breast evaluation. The most recent mammogram was done on4/8/16 .  Patient had a left breast ultrasound done on 04/07/14. Patient does perform regular self breast checks and gets regular mammograms done. Patient reports she did fall tripping on her walker on Sunday.  HPI  Past Medical History  Diagnosis Date  . HTN (hypertension)   . Depression   . Breast mass   . Stroke Sept-Oct 2015    Past Surgical History  Procedure Laterality Date  . Tonsillectomy    . Abdominal hysterectomy  1973  . Cholecystectomy  1990    Family History  Problem Relation Age of Onset  . Cancer Sister     breast  . Diabetes Brother   . Stroke Mother   . Seizures Sister     Social History History  Substance Use Topics  . Smoking status: Former Smoker -- 69 years    Quit date: 09/01/2013  . Smokeless tobacco: Never Used  . Alcohol Use: No    Allergies  Allergen Reactions  . Ace Inhibitors     Renal insufficiency  . Augmentin [Amoxicillin-Pot Clavulanate] Itching  . Sulfa Antibiotics Itching    Current Outpatient Prescriptions  Medication Sig Dispense Refill  . amitriptyline (ELAVIL) 10 MG tablet Take 1 tablet by mouth as needed.    Marland Kitchen amLODipine (NORVASC) 10 MG tablet Take 1 tablet (10 mg total) by mouth daily. 30 tablet 0  . aspirin 81 MG tablet Take 81 mg by mouth daily.    Marland Kitchen doxazosin (CARDURA) 2 MG tablet Take 1 tablet (2 mg total) by mouth at bedtime. 30 tablet 0  . escitalopram (LEXAPRO) 10 MG tablet Take 1 tablet (10 mg total) by mouth daily. 30 tablet 0  . famotidine (PEPCID) 20 MG tablet Take 1 tablet (20 mg total) by mouth at bedtime. 30 tablet 0  . fluticasone (FLONASE) 50 MCG/ACT nasal spray Place 1 spray into both nostrils daily.    Marland Kitchen senna-docusate (SENOKOT-S) 8.6-50 MG  per tablet Take 2 tablets by mouth at bedtime. 60 tablet 0  . simvastatin (ZOCOR) 20 MG tablet Take 1 tablet (20 mg total) by mouth daily at 6 PM. 30 tablet 0   No current facility-administered medications for this visit.    Review of Systems Review of Systems  Constitutional: Negative.   Respiratory: Negative.   Cardiovascular: Negative.     Blood pressure 122/62, pulse 68, resp. rate 12, height 5' (1.524 m), weight 132 lb (59.875 kg).  Physical Exam Physical Exam  Constitutional: She is oriented to person, place, and time. She appears well-developed and well-nourished.  Eyes: Conjunctivae are normal. No scleral icterus.  Neck: Neck supple.  Cardiovascular: Normal rate, regular rhythm and normal heart sounds.   Pulmonary/Chest: Effort normal and breath sounds normal. Right breast exhibits no inverted nipple, no mass, no nipple discharge, no skin change and no tenderness. Left breast exhibits no inverted nipple, no mass, no nipple discharge, no skin change and no tenderness.    Lymphadenopathy:    She has no cervical adenopathy.    She has no axillary adenopathy.  Neurological: She is alert and oriented to person, place, and time.  Skin: Skin is warm and dry.    Data Reviewed Mammogram and  Ultrasound reviewed. Left breast mammogram and ultrasound dated 04/07/2014 shows the previously identified nodule densities to be smaller. Biopsy results are listed below. BI-RADS-3. Notation from the radiologist regarding recommendation for biopsy was reviewed, he apparently was unaware of the FNA results recorded below. Both areas of aspiration showed findings consistent with lymph node. Patient and daughter are aware. We'll arrange for a 6 month follow-up.  Cc: Dr.Fisher     54mo ago    Source: Comment   Comments: LEFT BREAST   CLINICIAN PROVIDED ICD10: Comment   Comments: 611.72  N63     DIAGNOSIS: Comment   Comments: LEFT BREAST  NEGATIVE FOR MALIGNANT CELLS.  SPECIMEN  CONSISTS OF MATURE-APPEARING LYMPHOCYTES AND BLOOD,  CONSISTENT WITH AN INTRAMAMMARY LYMPH NODE.  NO EPITHELIAL COMPONENT IDENTIFIED.     Signed out by: Comment   Comments: Galvin Proffer, MD, Pathologist   Performed by: Comment   Comments: Mills Koller        Assessment     Benign breast exam. Benign lymph tissue within the breast.      Plan    Plan to have bilateral mammograms with her primary care physician in fall 2016 with her primary care provider.  The patient does not require repeat visit here unless a new abnormality is appreciated.      PCP:  Nolene Bernheim 04/20/2014, 9:36 AM

## 2014-04-24 NOTE — Discharge Summary (Signed)
PATIENT NAME:  Candace Monroe, Candace Monroe MR#:  [id number] DATE OF BIRTH:  [birth year]  DATE OF ADMISSION:  09/29/2013 DATE OF DISCHARGE:  10/01/2013  ADMISSION DIAGNOSIS: Cerebrovascular accident.   DISCHARGE DIAGNOSES:  1.  Acute left lacunar infarct.  2.  Tobacco dependence. 3.  Severe hypertension.   CONSULTATIONS: Neurology.   PROCEDURES: The patient had an MRI of the brain, which showed an acute lacunar infarct in the left frontal lobe.   A 2-D echocardiogram showed normal ejection fraction 50% to 60%, with impaired relaxation pattern of LV diastolic dysfunction, pulmonary hypertension is absent.     Carotid ultrasound showed 50% to 69% stenosis in the right ICA, less than 50% on the left.   LDL is 133, cholesterol 203, triglycerides 189.   HOSPITAL COURSE:  A 74 year old female presented with right lower extremity weakness and found to have an acute CVA on MRI.  For further details, please refer to h and p. .   1.  Acute left lacunar infarct given her right-sided symptoms. The patient was placed on aspirin, statin.  PT evaluated the patient.  She needs inpatient acute rehab.  She will continue on aspirin and statin.  Her LFTs need to be followed up in about 6 weeks.  Echo showed some diastolic impairment likely from uncontrolled hypertension. Her carotids showed 50% to 69% RICA and the patient will be referred to vascular surgery for outpatient follow-up in about to 4 to 6 weeks. 2.  Hypertension.  Some permissive hypertension.  She will need good blood pressure control at home.  3.  Tobacco abuse. The patient is on a nicotine patch and was counseled on admission.  4.  Depression on Lexapro.   DISCHARGE MEDICATIONS:  1.  Aspirin 81 mg daily.  2.  Nicotine patch 14 mg per 24 hours. 3. Amitrytiline 10 mg 2 tablets at bedtime.  4.  Lexapro 10 mg daily.  5.  Benazepril 20 mg b.i.d.  6.  Amitriptyline 10 mg at bedtime p.r.n.   DISCHARGE DIET: Low sodium.   DISCHARGE ACTIVITY: As tolerated.    DISCHARGE REFERRAL: Physical therapy.   The patient is stable for discharge.    TIME SPENT: Approximately 40 minutes.    ___________________________ Donell Beers. Benjie Karvonen, MD spm:DT D: 10/01/2013 [id number] ET T: 10/01/2013 11:59:01 ET JOB#: [id number]  cc: Kanan Sobek P. Benjie Karvonen, MD, <Dictator> [place] P Danella Philson MD ELECTRONICALLY SIGNED 10/01/2013 13:29

## 2014-04-24 NOTE — H&P (Signed)
PATIENT NAME:  Candace Monroe, Candace Monroe MR#:  [id number] DATE OF BIRTH:  1940/04/22  DATE OF ADMISSION:  09/29/2013  PRIMARY CARE PHYSICIAN: [facility].   CHIEF COMPLAINT: Right-sided weakness.  HISTORY OF PRESENT ILLNESS: A very pleasant 74 year old female with a history of hypertension and tobacco dependence who presents with the above complaint. The patient around 12:30 this afternoon was sitting on her porch when she developed sudden onset of right-sided pain, inability to move her right leg. She actually had to crawl to call her daughter and then they called EMS because she was so weak on that right side and ataxic. In the ER, she had a CT of the head which was negative.   REVIEW OF SYSTEMS: CONSTITUTIONAL: No fever, fatigue, weakness. EYES: No blurred or double vision. ENT: No epistaxis, hearing loss, allergies. RESPIRATORY: No cough, wheezing, hemoptysis, COPD. CARDIOVASCULAR: No chest pain, orthopnea, edema, syncope, arrhythmia. GASTROINTESTINAL: No nausea, vomiting, diarrhea, abdominal pain, melena, or ulcers. GENITOURINARY: No dysuria or hematuria. ENDOCRINE: No polyuria or polydipsia. HEMATOLOGIC AND LYMPHATIC: No bleeding or swollen glands. SKIN: No rash or lesions. MUSCULOSKELETAL: Positive right-sided weakness. NEUROLOGIC: No history of CVA, TIA, or seizures. PSYCHIATRIC: Positive depression.  PAST MEDICAL HISTORY: 1. Hypertension.  2. Depression.   MEDICATIONS: 1. Lexapro 10 mg daily.  2. Benazepril 20 mg b.i.d.  3. Amitriptyline 10 mg at bedtime.   ALLERGIES: SULFA CAUSED ITCHING, AND AUGMENTIN CAUSES ITCHING.   The patient smokes half a pack a day. No alcohol or IV drug use.  FAMILY HISTORY: Positive for hypertension and diabetes.   PAST SURGICAL HISTORY:  1. Tonsillectomy.  2. Hysterectomy.  3. Cholecystectomy.  4. Breast mass scheduled for biopsy tomorrow.   PHYSICAL EXAMINATION:  VITAL SIGNS: Temperature 98.3, pulse is 68, respirations 25, blood  pressure 199/86, and 96% on room air.  GENERAL: The patient is alert, oriented, not in acute distress.  HEENT: Head is atraumatic, pupils are anicteric. Mucous membranes are moist.  OROPHARYNX: Clear.  NECK: Supple without JVD, carotid bruit  CARDIOVASCULAR: Regular rate and rhythm. No murmurs, gallops, or rubs. PMI is not displaced. LUNGS: Clear to auscultation without crackles, rales, rhonchi, or wheezing. Normal to percussion.  ABDOMEN: Bowel sounds are positive, nontender, nondistended. No hepatosplenomegaly. EXTREMITIES: No clubbing, cyanosis, or edema. NEUROLOGIC: Cranial nerves II though XII are intact. There is no focal deficit. The patient has a slight right foot drop.  MUSCULOSKELETAL: She has 5/5 strength bilaterally and symmetrically. Finger to nose is intact as well as heel to shin. SKIN: Without rash or lesions.  LABORATORY DATA: Sodium 142, potassium 4.3, chloride 111, bicarbonate 24, BUN 25, creatinine 1.47, glucose is 120.  Calcium 9.0, bilirubin is 0.4, alkaline phosphatase 79, ALT 16, AST 20, total protein 6.8, albumin 3.7. White blood cells 10.2, hemoglobin 15, hematocrit 46, platelets are 187,000, troponin 0.03.   CT of the head shows no acute hemorrhage or CVA,   EKG: Normal sinus rhythm. No ST elevation or depression.   ASSESSMENT AND PLAN: This is a 74 year old female who presented with right-sided weakness, still has some residual and very mild right-sided weakness and a slight foot drop concerning for a cerebrovascular accident.  1. Cerebrovascular accident. The patient will be admitted to the hospitalist service. She will need physical therapy consultation, aspirin, statin, MRI, carotid Dopplers, and echocardiogram. We will continue with neurologic checks q. 4 hours for now.  2. 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 Because she is suspected to have a stroke, I will allow permissive  hypertension. I have written for some Monroer.n. hydralazine if systolic  blood pressure is greater than 190. I will hold her benazepril for now until we can clarify by the MRI if she has had an acute stroke.  3. Tobacco dependence. The patient was encouraged to stop smoking. She is in the contemplative stage. The patient was counseled for 3 minutes and patient will be placed on a nicotine patch, which she is agreeable to. 4. Depression. We will continue Lexapro.   CODE STATUS: The patient is a full code status.   TIME SPENT: Approximately 45 minutes.     ____________________________ Donell Beers. Benjie Karvonen, MD spm:lt D: 09/29/2013 18:59:42 ET T: 09/29/2013 19:24:55 ET JOB#: [id number]  cc: Candace Filion P. Benjie Karvonen, MD, <Dictator> [place] P Mckynleigh Mussell MD ELECTRONICALLY SIGNED 09/29/2013 20:48

## 2014-04-24 NOTE — Discharge Summary (Signed)
PATIENT NAME:  Candace Monroe, Candace Monroe MR#:  [id number] DATE OF BIRTH:  [birth year]  DATE OF ADMISSION:  09/29/2013 DATE OF DISCHARGE:    Addendum:  The patient is stable for discharge on 10/02/2013. Discharge was held until insurance approval.    ____________________________ Donell Beers. Benjie Karvonen, MD spm:JT D: 10/02/2013 13:14:56 ET T: 10/02/2013 15:10:01 ET JOB#: [id number]  cc: Jiro Kiester P. Benjie Karvonen, MD, <Dictator> Donelle Baba P Marilyne Haseley MD ELECTRONICALLY SIGNED 10/02/2013 20:06

## 2014-04-24 NOTE — Consult Note (Signed)
PATIENT NAME:  Candace Monroe, Candace Monroe MR#:  [id number] DATE OF BIRTH:  [birth year]  DATE OF CONSULTATION:  09/30/2013  REFERRING PHYSICIAN:   CONSULTING PHYSICIAN:  Leotis Pain, MD  REASON FOR CONSULTATION: Stroke.  HISTORY OF PRESENT ILLNESS: Information obtained from the patient's family at bedside and the patient herself as well. This is a 74 year old pleasant female with past medical history of diabetes, chronic hypertension, not well controlled, chronic smoking history of at least 50 years, presented with sudden onset of right upper and right lower extremity weakness that has improved, but has not subsided. CAT scan of the head did not show any acute intracranial abnormality. Imaging showed the left frontoparietal infarct consistent with her current symptoms.   REVIEW OF SYSTEMS: CONSTITUTIONAL: No fever. Generalized fatigue.  EYES: No blurry vision. No double vision.  RESPIRATORY: No shortness of breath.  GASTROINTESTINAL: No abdominal pain.  ENDOCRINOLOGY: No heat or cold intolerance. NEUROLOGICAL: Positive for weakness on the right side.  PSYCHIATRIC: Positive history of depression.   PAST MEDICAL HISTORY: Hypertension, depression, smoking history and the patient states diabetes, but she has been off her diabetic medications.   HOME MEDICATIONS: Include Lexapro, benazepril, amitriptyline at bedtime.   ALLERGIES: INCLUDE SULFA.   PAST SURGICAL HISTORY: Tonsillectomy, hysterectomy, cholecystectomy.   IMAGING: CAT scan of the head reviewed, no acute intracranial abnormality. MRI of the brain, left frontoparietal infarct.   LABORATORY WORKUP: Has been reviewed.   PHYSICAL EXAMINATION:  VITAL SIGNS: Include a temperature of 98, pulse 72, respirations 18, blood pressure 178/75.  NEUROLOGIC: The patient's speech appears to be fluent. Extraocular movements intact. No facial droop noted. Tongue is midline. Uvula elevates symmetrically. Shoulder shrug intact. Motor strength right upper  extremity drift present. Right upper extremity is 4/5, right lower extremity is 4 to 4- out of 5 with right upper extremity drift. Sensation slightly diminished in the right upper and right lower extremities. Reflexes severely diminished bilaterally. Coordination: Finger-to-nose intact. Gait could not be assessed.   IMPRESSION: A 74 year old female admitted with a right upper and right lower extremity weakness that has slightly improved. Likely contributing factors to the current left frontoparietal stroke includes chronic history of smoking, diabetes, uncontrolled hypertension which was 240/100 on initial evaluation on presentation.   PLAN: Better blood pressure control, start aspirin, start statin. Obtain lipid panel, hemoglobin A1c. Physical therapy, occupational therapy. Significant time spent speaking with the patient regarding smoking cessation. I would prefer the patient to be started on full dose aspirin, aspirin 325. Continue Lovenox for deep venous thrombosis prophylaxis. Ultrasound of carotids reviewed, there is a 50% to 69% stenosis in the right side. Would follow with carotid ultrasounds yearly. No further intervention for that either. Awaiting 2D echocardiogram results. This case discussed with the patient's family and the patient at bedside.   Thank you. It was a pleasure seeing this patient.    ____________________________ Leotis Pain, MD yz:TT D: 09/30/2013 13:56:56 ET T: 09/30/2013 14:22:47 ET JOB#: [id number]  cc: Leotis Pain, MD, <Dictator> Leotis Pain MD ELECTRONICALLY SIGNED 10/05/2013 14:26

## 2014-04-28 DIAGNOSIS — Z0000 Encounter for general adult medical examination without abnormal findings: Secondary | ICD-10-CM | POA: Diagnosis not present

## 2014-04-28 DIAGNOSIS — I10 Essential (primary) hypertension: Secondary | ICD-10-CM | POA: Diagnosis not present

## 2014-04-28 DIAGNOSIS — E1129 Type 2 diabetes mellitus with other diabetic kidney complication: Secondary | ICD-10-CM | POA: Diagnosis not present

## 2014-04-28 DIAGNOSIS — E785 Hyperlipidemia, unspecified: Secondary | ICD-10-CM | POA: Diagnosis not present

## 2014-04-28 DIAGNOSIS — E559 Vitamin D deficiency, unspecified: Secondary | ICD-10-CM | POA: Diagnosis not present

## 2014-04-28 DIAGNOSIS — Z23 Encounter for immunization: Secondary | ICD-10-CM | POA: Diagnosis not present

## 2014-04-29 ENCOUNTER — Other Ambulatory Visit: Payer: Self-pay | Admitting: Family Medicine

## 2014-04-29 DIAGNOSIS — E559 Vitamin D deficiency, unspecified: Secondary | ICD-10-CM

## 2014-05-03 LAB — HM DEXA SCAN

## 2014-05-05 ENCOUNTER — Ambulatory Visit
Admission: RE | Admit: 2014-05-05 | Discharge: 2014-05-05 | Disposition: A | Discharge status: Home / Self Care | Payer: Medicare Other | Source: Ambulatory Visit | Attending: Family Medicine | Admitting: Family Medicine

## 2014-05-05 DIAGNOSIS — M810 Age-related osteoporosis without current pathological fracture: Secondary | ICD-10-CM | POA: Insufficient documentation

## 2014-05-05 DIAGNOSIS — Z0000 Encounter for general adult medical examination without abnormal findings: Secondary | ICD-10-CM | POA: Insufficient documentation

## 2014-05-05 DIAGNOSIS — E559 Vitamin D deficiency, unspecified: Secondary | ICD-10-CM | POA: Diagnosis not present

## 2014-08-18 ENCOUNTER — Telehealth: Payer: Self-pay | Admitting: Family Medicine

## 2014-08-18 NOTE — Telephone Encounter (Signed)
All of [place] have shared patients when needed.

## 2014-08-18 NOTE — Telephone Encounter (Signed)
Pt husband called requesting pt switch to seeing Simona Huh instead of [facility].   CB#[phone]/MW

## 2014-08-18 NOTE — Telephone Encounter (Signed)
Please advise.  Thanks,  -Joseline 

## 2014-09-29 ENCOUNTER — Encounter: Payer: Self-pay | Admitting: Internal Medicine

## 2014-09-29 ENCOUNTER — Ambulatory Visit (INDEPENDENT_AMBULATORY_CARE_PROVIDER_SITE_OTHER): Payer: PRIVATE HEALTH INSURANCE | Admitting: Internal Medicine

## 2014-09-29 VITALS — BP 148/78 | HR 67 | Temp 97.9°F | Resp 14 | Ht 60.25 in | Wt 135.5 lb

## 2014-09-29 DIAGNOSIS — E1122 Type 2 diabetes mellitus with diabetic chronic kidney disease: Secondary | ICD-10-CM

## 2014-09-29 DIAGNOSIS — G8110 Spastic hemiplegia affecting unspecified side: Secondary | ICD-10-CM | POA: Diagnosis not present

## 2014-09-29 DIAGNOSIS — I10 Essential (primary) hypertension: Secondary | ICD-10-CM

## 2014-09-29 DIAGNOSIS — N189 Chronic kidney disease, unspecified: Secondary | ICD-10-CM

## 2014-09-29 DIAGNOSIS — Z23 Encounter for immunization: Secondary | ICD-10-CM | POA: Diagnosis not present

## 2014-09-29 DIAGNOSIS — F3341 Major depressive disorder, recurrent, in partial remission: Secondary | ICD-10-CM

## 2014-09-29 NOTE — Patient Instructions (Addendum)
Your have chronic kidney disease  ,  This is why you need to avoid aleve and Motrin.   I will refer you to Dr Lisette Grinder at [facility]  To look at your shoulders and possibly give you a steroid injection in them   I want you start walking EVERY DAY FOR A TOTAL OF 15 MINUTES TO START.  I will see you again in November   Chronic Kidney Disease Chronic kidney disease occurs when the kidneys are damaged over a long period. The kidneys are two organs that lie on either side of the spine between the middle of the back and the front of the abdomen. The kidneys:   Remove wastes and extra water from the blood.   Produce important hormones. These help keep bones strong, regulate blood pressure, and help create red blood cells.   Balance the fluids and chemicals in the blood and tissues. A small amount of kidney damage may not cause problems, but a large amount of damage may make it difficult or impossible for the kidneys to work the way they should. If steps are not taken to slow down the kidney damage or stop it from getting worse, the kidneys may stop working permanently. Most of the time, chronic kidney disease does not go away. However, it can often be controlled, and those with the disease can usually live normal lives. CAUSES  The most common causes of chronic kidney disease are diabetes and high blood pressure (hypertension). Chronic kidney disease may also be caused by:   Diseases that cause the kidneys' filters to become inflamed.   Diseases that affect the immune system.   Genetic diseases.   Medicines that damage the kidneys, such as anti-inflammatory medicines.  Poisoning or exposure to toxic substances.   A reoccurring kidney or urinary infection.   A problem with urine flow. This may be caused by:   Cancer.   Kidney stones.   An enlarged prostate in males. SIGNS AND SYMPTOMS  Because the kidney damage in chronic kidney disease occurs slowly, symptoms  develop slowly and may not be obvious until the kidney damage becomes severe. A person may have a kidney disease for years without showing any symptoms. Symptoms can include:   Swelling (edema) of the legs, ankles, or feet.   Tiredness (lethargy).   Nausea or vomiting.   Confusion.   Problems with urination, such as:   Decreased urine production.   Frequent urination, especially at night.   Frequent accidents in children who are potty trained.   Muscle twitches and cramps.   Shortness of breath.  Weakness.   Persistent itchiness.   Loss of appetite.  Metallic taste in the mouth.  Trouble sleeping.  Slowed development in children.  Short stature in children. DIAGNOSIS  Chronic kidney disease may be detected and diagnosed by tests, including blood, urine, imaging, or kidney biopsy tests.  TREATMENT  Most chronic kidney diseases cannot be cured. Treatment usually involves relieving symptoms and preventing or slowing the progression of the disease. Treatment may include:   A special diet. You may need to avoid alcohol and foods thatare salty and high in potassium.   Medicines. These may:   Lower blood pressure.   Relieve anemia.   Relieve swelling.   Protect the bones. HOME CARE INSTRUCTIONS   Follow your prescribed diet.   Take medicines only as directed by your health care provider. Do not take any new medicines (prescription, over-the-counter, or nutritional supplements) unless approved by your health  care provider. Many medicines can worsen your kidney damage or need to have the dose adjusted.   Quit smoking if you smoke. Talk to your health care provider about a smoking cessation program.   Keep all follow-up visits as directed by your health care provider. SEEK IMMEDIATE MEDICAL CARE IF:  Your symptoms get worse or you develop new symptoms.   You develop symptoms of end-stage kidney disease. These include:   Headaches.    Abnormally dark or light skin.   Numbness in the hands or feet.   Easy bruising.   Frequent hiccups.   Menstruation stops.   You have a fever.   You have decreased urine production.   You havepain or bleeding when urinating. MAKE SURE YOU:  Understand these instructions.  Will watch your condition.  Will get help right away if you are not doing well or get worse. FOR MORE INFORMATION   American Association of Kidney Patients: BombTimer.gl  National Kidney Foundation: www.kidney.[place]: https://mathis.com/  [facility]: www.lifeoptions.org and www.kidneyschool.org Document Released: 09/27/2007 Document Revised: 05/04/2013 Document Reviewed: 08/17/2011 [facility] Patient Information 2015 [place], [place]. This information is not intended to replace advice given to you by your health care provider. Make sure you discuss any questions you have with your health care provider.

## 2014-09-29 NOTE — Assessment & Plan Note (Signed)
Secondary to right brain CVA

## 2014-09-29 NOTE — Progress Notes (Signed)
Subjective:  Patient ID: Candace Monroe, female    DOB: [birth year]  Age: 74 y.o. MRN: [id number]  CC: The primary encounter diagnosis was Encounter for immunization. Diagnoses of Type 2 diabetes mellitus with diabetic chronic kidney disease, Essential hypertension, Spastic hemiparesis affecting dominant side, and Major depressive disorder, recurrent episode, in partial remission were also pertinent to this visit.  HPI  Candace Monroe presents for  establishment of care and management of hyperliidemia, hypertension,  History of CVA and diabetes mellitus , now diet controlled  Recent past, last a1c of < 6.0  In August . No hypoglycemic events in the last several months.  Annual eye exam [facility] care  Due soon  Breast biopsy normal  Left side Nov 2015  Normal  6 month follow up in October  Has been ordered by [employer] [facility]  Normal colonoscopy 3 years ago,  A few polyps    History of CVA Sept 29 2015 while home alone presented with right leg paresis. BP was  242/100 .  Was hospitalized and rehabbed 21 days,  Strength regained but still uses walker for security. She is IADLS except for meal preparation .  Husband reports that she has been very sedentary since her CVA, does not walk daily    Had a fall at church,  [place] over the walker.  Occurred a month ago  No cc today except bilateral shoulders .  Was  told to avoid Aleve about a year ago . No history of ulcer. Not sure if she has CKD.    History  Candace Monroe has a past medical history of HTN (hypertension); Depression; Breast mass; Stroke (Sept-Oct 2015); Diverticulitis; Diabetes mellitus without complication; Arthritis; Hyperlipidemia; and Urine incontinence.   She has past surgical history that includes Tonsillectomy; Abdominal hysterectomy (1973); and Cholecystectomy (1990).   Her family history includes Cancer (age of onset: 80) in her sister; Diabetes in her brother; Seizures in her sister; Stroke in her mother.She reports that she quit  smoking about 12 months ago. She has never used smokeless tobacco. She reports that she does not drink alcohol or use illicit drugs.  Outpatient Prescriptions Prior to Visit  Medication Sig Dispense Refill  . amitriptyline (ELAVIL) 10 MG tablet Take 1 tablet by mouth as needed.    Marland Kitchen amLODipine (NORVASC) 10 MG tablet Take 1 tablet (10 mg total) by mouth daily. 30 tablet 0  . aspirin 81 MG tablet Take 81 mg by mouth daily.    Marland Kitchen doxazosin (CARDURA) 2 MG tablet Take 1 tablet (2 mg total) by mouth at bedtime. 30 tablet 0  . escitalopram (LEXAPRO) 10 MG tablet Take 1 tablet (10 mg total) by mouth daily. 30 tablet 0  . fluticasone (FLONASE) 50 MCG/ACT nasal spray Place 1 spray into both nostrils daily.    . simvastatin (ZOCOR) 20 MG tablet Take 1 tablet (20 mg total) by mouth daily at 6 PM. 30 tablet 0  . famotidine (PEPCID) 20 MG tablet Take 1 tablet (20 mg total) by mouth at bedtime. (Patient not taking: Reported on 09/29/2014) 30 tablet 0  . senna-docusate (SENOKOT-S) 8.6-50 MG per tablet Take 2 tablets by mouth at bedtime. (Patient not taking: Reported on 09/29/2014) 60 tablet 0   No facility-administered medications prior to visit.    Review of Systems:  Patient denies headache, fevers, malaise, unintentional weight loss, skin rash, eye pain, sinus congestion and sinus pain, sore throat, dysphagia,  hemoptysis , cough, dyspnea, wheezing, chest pain, palpitations, orthopnea, edema,  abdominal pain, nausea, melena, diarrhea, constipation, flank pain, dysuria, hematuria, urinary  Frequency, nocturia, numbness, tingling, seizures,  Focal weakness, Loss of consciousness,  Tremor, insomnia, depression, anxiety, and suicidal ideation.     Objective:  BP 148/78 mmHg  Pulse 67  Temp(Src) 97.9 F (36.6 C) (Oral)  Resp 14  Ht 5' 0.25" (1.53 m)  Wt 135 lb 8 oz (61.462 kg)  BMI 26.26 kg/m2  SpO2 98%  Physical Exam:  General appearance: alert, cooperative and appears stated age Head: Normocephalic,  without obvious abnormality, atraumatic Eyes: conjunctivae/corneas clear. PERRL, EOM's intact. Fundi benign. Ears: normal TM's and external ear canals both ears Nose: Nares normal. Septum midline. Mucosa normal. No drainage or sinus tenderness. Throat: lips, mucosa, and tongue normal; teeth and gums normal Neck: no adenopathy, no carotid bruit, no JVD, supple, symmetrical, trachea midline and thyroid not enlarged, symmetric, no tenderness/mass/nodules Lungs: clear to auscultation bilaterally Heart: regular rate and rhythm, S1, S2 normal, no murmur, click, rub or gallop Abdomen: soft, non-tender; bowel sounds normal; no masses,  no organomegaly Extremities: extremities normal, atraumatic, no cyanosis or edema Pulses: 2+ and symmetric Skin: Skin color, texture, turgor normal. No rashes or lesions Neurologic: Alert and oriented X 3, normal strength and tone. Normal symmetric reflexes. Normal coordination and gait.    Assessment & Plan:   Problem List Items Addressed This Visit    Spastic hemiparesis affecting dominant side    Secondary to right brain CVA       HTN (hypertension)    BP 148/78 mmHg  Pulse 67  Temp(Src) 97.9 F (36.6 C) (Oral)  Resp 14  Ht 5' 0.25" (1.53 m)  Wt 135 lb 8 oz (61.462 kg)  BMI 26.26 kg/m2  SpO2 98%  Mild elevation today  On max dose of amlodipine and sub max dose of cardura.  Patient to check BP at home and bring readings to next appt.       Major depressive disorder, recurrent episode, in partial remission    Managed with Lexapro.  No changes today.  Her sedentary  Lifestyle may be due to persistent depression.  Encouraged her to be more active daily , follow up 3 months       Diabetes mellitus with chronic kidney disease    Currently diet controlled.  Review of records indicates stage 3 CKD  She is not on an ACE I because she has had worsening renal function. Records requested from prior PCP        Other Visit Diagnoses    Encounter for  immunization    -  Primary      A total of 45 minutes of face to face time was spent with patient more than half of which was spent in counselling and coordination of care  I am having Ms. Schippers maintain her amLODipine, doxazosin, famotidine, senna-docusate, simvastatin, escitalopram, aspirin, amitriptyline, fluticasone, and alendronate.  Meds ordered this encounter  Medications  . alendronate (FOSAMAX) 70 MG tablet    Sig: TK 1 T PO  Q WEEK    Refill:  3    There are no discontinued medications.  Follow-up: Return in about 2 months (around 11/29/2014) for follow up diabetes.   Crecencio Mc, MD

## 2014-09-29 NOTE — Assessment & Plan Note (Addendum)
BP 148/78 mmHg  Pulse 67  Temp(Src) 97.9 F (36.6 C) (Oral)  Resp 14  Ht 5' 0.25" (1.53 m)  Wt 135 lb 8 oz (61.462 kg)  BMI 26.26 kg/m2  SpO2 98%  Mild elevation today  On max dose of amlodipine and sub max dose of cardura.  Patient to check BP at home and bring readings to next appt.

## 2014-09-29 NOTE — Assessment & Plan Note (Addendum)
Managed with Lexapro.  No changes today.  Her sedentary  Lifestyle may be due to persistent depression.  Encouraged her to be more active daily , follow up 3 months

## 2014-09-29 NOTE — Progress Notes (Signed)
Pre-visit discussion using our clinic review tool. No additional management support is needed unless otherwise documented below in the visit note.  

## 2014-09-29 NOTE — Assessment & Plan Note (Addendum)
Currently diet controlled.  Review of records indicates stage 3 CKD  She is not on an ACE I because she has had worsening renal function. Records requested from prior PCP

## 2014-10-01 ENCOUNTER — Other Ambulatory Visit: Payer: Self-pay | Admitting: Family Medicine

## 2014-10-01 DIAGNOSIS — F32A Depression, unspecified: Secondary | ICD-10-CM

## 2014-10-01 DIAGNOSIS — F329 Major depressive disorder, single episode, unspecified: Secondary | ICD-10-CM

## 2014-10-01 MED ORDER — ESCITALOPRAM OXALATE 10 MG PO TABS: 10.0000 mg | ORAL_TABLET | Freq: Every day | ORAL | Status: DC

## 2014-10-17 ENCOUNTER — Other Ambulatory Visit: Payer: Self-pay | Admitting: Family Medicine

## 2014-11-12 ENCOUNTER — Other Ambulatory Visit: Payer: Self-pay | Admitting: Family Medicine

## 2014-11-30 ENCOUNTER — Other Ambulatory Visit: Payer: Self-pay | Admitting: Internal Medicine

## 2014-11-30 ENCOUNTER — Other Ambulatory Visit (INDEPENDENT_AMBULATORY_CARE_PROVIDER_SITE_OTHER): Payer: PRIVATE HEALTH INSURANCE

## 2014-11-30 ENCOUNTER — Telehealth: Payer: Self-pay | Admitting: *Deleted

## 2014-11-30 DIAGNOSIS — E0822 Diabetes mellitus due to underlying condition with diabetic chronic kidney disease: Secondary | ICD-10-CM

## 2014-11-30 DIAGNOSIS — E559 Vitamin D deficiency, unspecified: Secondary | ICD-10-CM

## 2014-11-30 DIAGNOSIS — M10019 Idiopathic gout, unspecified shoulder: Secondary | ICD-10-CM | POA: Diagnosis not present

## 2014-11-30 DIAGNOSIS — M109 Gout, unspecified: Secondary | ICD-10-CM

## 2014-11-30 LAB — CBC WITH DIFFERENTIAL/PLATELET
BASOS PCT: 0.6 % (ref 0.0–3.0)
Basophils Absolute: 0 10*3/uL (ref 0.0–0.1)
EOS PCT: 2.9 % (ref 0.0–5.0)
Eosinophils Absolute: 0.2 10*3/uL (ref 0.0–0.7)
HEMATOCRIT: 39.6 % (ref 36.0–46.0)
HEMOGLOBIN: 13 g/dL (ref 12.0–15.0)
LYMPHS PCT: 23.5 % (ref 12.0–46.0)
Lymphs Abs: 2 10*3/uL (ref 0.7–4.0)
MCHC: 32.7 g/dL (ref 30.0–36.0)
MCV: 88.9 fl (ref 78.0–100.0)
MONOS PCT: 5.9 % (ref 3.0–12.0)
Monocytes Absolute: 0.5 10*3/uL (ref 0.1–1.0)
NEUTROS PCT: 67.1 % (ref 43.0–77.0)
Neutro Abs: 5.7 10*3/uL (ref 1.4–7.7)
Platelets: 229 10*3/uL (ref 150.0–400.0)
RBC: 4.45 Mil/uL (ref 3.87–5.11)
RDW: 13.3 % (ref 11.5–15.5)
WBC: 8.4 10*3/uL (ref 4.0–10.5)

## 2014-11-30 LAB — LIPID PANEL
CHOLESTEROL: 139 mg/dL (ref 0–200)
HDL: 43.2 mg/dL (ref >39.00)
LDL Cholesterol: 74 mg/dL (ref 0–99)
NonHDL: 95.6
Total CHOL/HDL Ratio: 3
Triglycerides: 110 mg/dL (ref 0.0–149.0)
VLDL: 22 mg/dL (ref 0.0–40.0)

## 2014-11-30 LAB — COMPREHENSIVE METABOLIC PANEL
ALBUMIN: 3.8 g/dL (ref 3.5–5.2)
ALK PHOS: 63 U/L (ref 39–117)
ALT: 9 U/L (ref 0–35)
AST: 12 U/L (ref 0–37)
BILIRUBIN TOTAL: 0.5 mg/dL (ref 0.2–1.2)
BUN: 19 mg/dL (ref 6–23)
CALCIUM: 8.9 mg/dL (ref 8.4–10.5)
CHLORIDE: 106 meq/L (ref 96–112)
CO2: 26 mEq/L (ref 19–32)
CREATININE: 1.29 mg/dL — AB (ref 0.40–1.20)
GFR: 42.64 mL/min — ABNORMAL LOW (ref >60.00)
Glucose, Bld: 100 mg/dL — ABNORMAL HIGH (ref 70–99)
Potassium: 3.7 mEq/L (ref 3.5–5.1)
Sodium: 142 mEq/L (ref 135–145)
TOTAL PROTEIN: 6 g/dL (ref 6.0–8.3)

## 2014-11-30 LAB — URIC ACID: Uric Acid, Serum: 5.8 mg/dL (ref 2.4–7.0)

## 2014-11-30 LAB — LDL CHOLESTEROL, DIRECT: LDL DIRECT: 109 mg/dL

## 2014-11-30 LAB — HEMOGLOBIN A1C: Hgb A1c MFr Bld: 6.3 % (ref 4.6–6.5)

## 2014-11-30 NOTE — Telephone Encounter (Signed)
Labs and dx?  

## 2014-12-01 ENCOUNTER — Encounter: Payer: Self-pay | Admitting: Internal Medicine

## 2014-12-01 ENCOUNTER — Ambulatory Visit (INDEPENDENT_AMBULATORY_CARE_PROVIDER_SITE_OTHER): Payer: PRIVATE HEALTH INSURANCE | Admitting: Internal Medicine

## 2014-12-01 ENCOUNTER — Other Ambulatory Visit: Payer: Self-pay | Admitting: Internal Medicine

## 2014-12-01 VITALS — BP 138/62 | HR 67 | Temp 98.2°F | Resp 12 | Ht 60.0 in | Wt 133.5 lb

## 2014-12-01 DIAGNOSIS — I63239 Cerebral infarction due to unspecified occlusion or stenosis of unspecified carotid arteries: Secondary | ICD-10-CM | POA: Insufficient documentation

## 2014-12-01 DIAGNOSIS — R928 Other abnormal and inconclusive findings on diagnostic imaging of breast: Secondary | ICD-10-CM

## 2014-12-01 DIAGNOSIS — I10 Essential (primary) hypertension: Secondary | ICD-10-CM | POA: Diagnosis not present

## 2014-12-01 DIAGNOSIS — M810 Age-related osteoporosis without current pathological fracture: Secondary | ICD-10-CM

## 2014-12-01 DIAGNOSIS — E119 Type 2 diabetes mellitus without complications: Secondary | ICD-10-CM

## 2014-12-01 DIAGNOSIS — Z1239 Encounter for other screening for malignant neoplasm of breast: Secondary | ICD-10-CM

## 2014-12-01 DIAGNOSIS — M1000 Idiopathic gout, unspecified site: Secondary | ICD-10-CM

## 2014-12-01 LAB — VITAMIN D 25 HYDROXY (VIT D DEFICIENCY, FRACTURES): VITD: 12.32 ng/mL — ABNORMAL LOW (ref 30.00–100.00)

## 2014-12-01 MED ORDER — ERGOCALCIFEROL 1.25 MG (50000 UT) PO CAPS: 50000.0000 [IU] | ORAL_CAPSULE | ORAL | Status: DC

## 2014-12-01 NOTE — Patient Instructions (Signed)
Your diabetes remains under excellent control  And your cholesterol has improved.  Your kidney function is down a little but it is STABLE, and most of your other labs are also normal. Avoid taking Motrin and Aleve because they can harm your kidneys.  Use Tylenol for pain     Your vitamin D is very low, which can increase your risk of weak bones and fractures and interfere with your body's ability to absorb the calcium in your diet.   I am calling in a megadose of Vit D to take once weekly for a total of 3 months,  Then after you finish the weekly supplement, you should start taking an OTC  Vit D3 supplement 1000 units daily.   You may need to have an ultrasound of your carotid artery on the left side to make sure it has not reached a critical point.  We will arrange that for you.   Please continue your current medications. return in 6 months for follow up on diabetes and make sure you are seeing your eye doctor at least once a year.

## 2014-12-01 NOTE — Progress Notes (Signed)
Pre-visit discussion using our clinic review tool. No additional management support is needed unless otherwise documented below in the visit note.  

## 2014-12-01 NOTE — Progress Notes (Signed)
Subjective:  Patient ID: Candace Monroe, female    DOB: 07-Jun-1940  Age: 74 y.o. MRN: [id number]  CC: The primary encounter diagnosis was Abnormal mammogram of left breast. Diagnoses of Carotid artery stenosis with cerebral infarction [facility]), Acute idiopathic gout, unspecified site, Essential hypertension, Type 2 diabetes, HbA1c goal < 7% ([place]), and Osteoporosis were also pertinent to this visit.  HPI Candace Monroe presents for follow up on diabetes and hypertension .  She is accompanied by her husband and is using her walker.  She has been checking her BP at home and reprots that her bps have averaged 132/72.  She is not takiing and ACE Inhibbitor due to prior intolerance   She has been checking her BS fasting and they have ranged from 90 to 116 Eye exam scheduled for January . Avoiding motrin and aleve    No falls since April, when she fell over her walker at church.  Outpatient Prescriptions Prior to Visit  Medication Sig Dispense Refill  . ACCU-CHEK AVIVA PLUS test strip USE 1 STRIP ONCE DAILY 100 each 1  . alendronate (FOSAMAX) 70 MG tablet TK 1 T PO  Q WEEK  3  . amitriptyline (ELAVIL) 10 MG tablet Take 1 tablet by mouth as needed.    Marland Kitchen amLODipine (NORVASC) 10 MG tablet TAKE 1 TABLET BY MOUTH EVERY DAY 90 tablet 0  . aspirin 81 MG tablet Take 81 mg by mouth daily.    Marland Kitchen doxazosin (CARDURA) 2 MG tablet TAKE 1 TABLET BY MOUTH AT BEDTIME 90 tablet 0  . escitalopram (LEXAPRO) 10 MG tablet Take 1 tablet (10 mg total) by mouth daily. 30 tablet 5  . fluticasone (FLONASE) 50 MCG/ACT nasal spray Place 1 spray into both nostrils daily.    . simvastatin (ZOCOR) 20 MG tablet TAKE 1 TABLET BY MOUTH EVERY DAY 90 tablet 0  . famotidine (PEPCID) 20 MG tablet TAKE 1 TABLET BY MOUTH EVERY DAY (Patient not taking: Reported on 12/01/2014) 90 tablet 0  . senna-docusate (SENOKOT-S) 8.6-50 MG per tablet Take 2 tablets by mouth at bedtime. (Patient not taking: Reported on 12/01/2014) 60 tablet 0   No  facility-administered medications prior to visit.    Review of Systems;  Patient denies headache, fevers, malaise, unintentional weight loss, skin rash, eye pain, sinus congestion and sinus pain, sore throat, dysphagia,  hemoptysis , cough, dyspnea, wheezing, chest pain, palpitations, orthopnea, edema, abdominal pain, nausea, melena, diarrhea, constipation, flank pain, dysuria, hematuria, urinary  Frequency, nocturia, numbness, tingling, seizures,  Focal weakness, Loss of consciousness,  Tremor, insomnia, depression, anxiety, and suicidal ideation.      Objective:  BP 138/62 mmHg  Pulse 67  Temp(Src) 98.2 F (36.8 C) (Oral)  Resp 12  Ht 5' (1.524 m)  Wt 133 lb 8 oz (60.555 kg)  BMI 26.07 kg/m2  SpO2 99%  BP Readings from Last 3 Encounters:  12/01/14 138/62  09/29/14 148/78  04/20/14 122/62    Wt Readings from Last 3 Encounters:  12/01/14 133 lb 8 oz (60.555 kg)  09/29/14 135 lb 8 oz (61.462 kg)  04/20/14 132 lb (59.875 kg)    General appearance: alert, cooperative and appears stated age Ears: normal TM's and external ear canals both ears Throat: lips, mucosa, and tongue normal; teeth and gums normal Neck: no adenopathy, no carotid bruit, supple, symmetrical, trachea midline and thyroid not enlarged, symmetric, no tenderness/mass/nodules Back: symmetric, no curvature. ROM normal. No CVA tenderness. Lungs: clear to auscultation bilaterally Heart: regular rate and  rhythm, S1, S2 normal, no murmur, click, rub or gallop Abdomen: soft, non-tender; bowel sounds normal; no masses,  no organomegaly Pulses: 2+ and symmetric Skin: Skin color, texture, turgor normal. No rashes or lesions Lymph nodes: Cervical, supraclavicular, and axillary nodes normal.  Lab Results  Component Value Date   HGBA1C 6.3 11/30/2014   HGBA1C 6.2* 10/05/2013    Lab Results  Component Value Date   CREATININE 1.29* 11/30/2014   CREATININE 1.20* 10/17/2013   CREATININE 1.25* 10/15/2013    Lab  Results  Component Value Date   WBC 8.4 11/30/2014   HGB 13.0 11/30/2014   HCT 39.6 11/30/2014   PLT 229.0 11/30/2014   GLUCOSE 100* 11/30/2014   CHOL 139 11/30/2014   TRIG 110.0 11/30/2014   HDL 43.20 11/30/2014   LDLDIRECT 109.0 11/30/2014   LDLCALC 74 11/30/2014   ALT 9 11/30/2014   AST 12 11/30/2014   NA 142 11/30/2014   K 3.7 11/30/2014   CL 106 11/30/2014   CREATININE 1.29* 11/30/2014   BUN 19 11/30/2014   CO2 26 11/30/2014   HGBA1C 6.3 11/30/2014    Dg Bone Density  05/05/2014  EXAM: DUAL X-RAY ABSORPTIOMETRY (DXA) FOR BONE MINERAL DENSITY IMPRESSION: Dear Dr [name], Your patient Candace Monroe completed a BMD test on 05/05/2014 using the [place] (analysis version: 14.10) manufactured by [employer]. The following summarizes the results of our evaluation. PATIENT BIOGRAPHICAL: Name: Candace Monroe, Candace Monroe Patient ID: [id number] Birth Date: 13-Dec-1940 Height: 60.0 in. Gender: Female Exam Date: 05/05/2014 Weight: 132.0 lbs. Indications: Advanced Age, Caucasian, Height Loss, Hysterectomy, Postmenopausal Fractures: Treatments: ASPRIN 81 MG, Prevacid ASSESSMENT: The BMD measured at Femur Neck Right is 0.581 g/cm2 with a T-score of -3.3. This patient is considered OSTEOPOROTIC according to [facility]) criteria. Site Region Measured Measured WHO Young Adult BMD Date       Age      Classification T-score AP Spine L1-L2 05/05/2014 [age] Osteoporosis -3.3 0.777 g/cm2 DualFemur Neck Right 05/05/2014 76.0 Osteoporosis -3.3 0.581 g/cm2 World Health Organization [facility]) criteria for post-menopausal, Caucasian Women: Normal:       T-score at or above -1 SD Osteopenia:   T-score between -1 and -2.5 SD Osteoporosis: T-score at or below -2.5 SD RECOMMENDATIONS: [place] recommends that FDA-approved medical therapies be considered in postmenopausal women and men age 26 or older with a: 1. Hip or vertebral (clinical or morphometric) fracture. 2. T-score of <  -2.5 at the spine or hip. 3. Ten-year fracture probability by FRAX of 3% or greater for hip fracture or 20% or greater for major osteoporotic fracture (patient did not meet criteria for FRAX assessment). All treatment decisions require clinical judgment and consideration of individual patient factors, including patient preferences, co-morbidities, previous drug use, risk factors not captured in the FRAX model (e.g. falls, vitamin D deficiency, increased bone turnover, interval significant decline in bone density) and possible under - or over-estimation of fracture risk by FRAX. All patients should ensure an adequate intake of dietary calcium (1200 mg/d) and vitamin D (800 IU daily) unless contraindicated. FOLLOW-UP: People with diagnosed cases of osteoporosis or at high risk for fracture should have regular bone mineral density tests. For patients eligible for Medicare, routine testing is allowed once every 2 years. The testing frequency can be increased to one year for patients who have rapidly progressing disease, those who are receiving or discontinuing medical therapy to restore bone mass, or have additional risk factors. I have reviewed this report, and  agree with the above findings. Mark A. Thornton Papas, M.D. Certified [facility] Electronically Signed   By: Lavonia Dana M.D.   On: 05/05/2014 09:56    Assessment & Plan:   Problem List Items Addressed This Visit    HTN (hypertension)    Well controlled on current regimen. Renal function stable, no changes today.  Lab Results  Component Value Date   CREATININE 1.29* 11/30/2014   Lab Results  Component Value Date   NA 142 11/30/2014   K 3.7 11/30/2014   CL 106 11/30/2014   CO2 26 11/30/2014         Gout flare    No recent episodes.  Uric acid <6.0      Carotid artery stenosis with cerebral infarction [facility])    Last evaluation was  Oct 2015: Carotid dopplers with 50-69% R-ICA stenosis with plaque in upper CCA,  <50% stenosis L-ICA.She has a left carotid bruit on exam today.  Referral to [facility] for evaluation       Relevant Orders   Ambulatory referral to Vascular Surgery   Osteoporosis    By May 2016 DEXA ,  Tscores -3.3 right femur.  Tolerating alendronate      Relevant Medications   ergocalciferol (DRISDOL) 50000 UNITS capsule   Abnormal mammogram of left breast - Primary   Relevant Orders   Ambulatory referral to General Surgery    Other Visit Diagnoses    Type 2 diabetes, HbA1c goal < 7% ([facility])        Relevant Orders    Comprehensive metabolic panel    Hemoglobin A1c    LDL cholesterol, direct    Lipid panel    Microalbumin / creatinine urine ratio     A total of 25 minutes of face to face time was spent with patient more than half of which was spent in counselling and coordination of care    I am having Ms. Aderman start on ergocalciferol. I am also having her maintain her senna-docusate, aspirin, amitriptyline, fluticasone, alendronate, escitalopram, ACCU-CHEK AVIVA PLUS, doxazosin, simvastatin, amLODipine, and famotidine.  Meds ordered this encounter  Medications  . ergocalciferol (DRISDOL) 50000 UNITS capsule    Sig: Take 1 capsule (50,000 Units total) by mouth once a week.    Dispense:  12 capsule    Refill:  0    90 day supply    There are no discontinued medications.  Follow-up: No Follow-up on file.   Crecencio Mc, MD

## 2014-12-01 NOTE — Assessment & Plan Note (Signed)
Benign biopsy April 2016,  Follow up mammogram bilateral ordered by Dr Bary Castilla.

## 2014-12-03 DIAGNOSIS — M810 Age-related osteoporosis without current pathological fracture: Secondary | ICD-10-CM | POA: Insufficient documentation

## 2014-12-03 NOTE — Assessment & Plan Note (Signed)
well-controlled on current medications.  hemoglobin A1c has been consistently at or  less than 7.0 . Patient is up-to-date on eye exams and foot exam is normal today. Patient is due for urine microalbumin to creatinine ratio at next visit. Patient is tolerating statin therapy for CAD risk reduction , but is not on ACE/ARB  Because of prior intolerance.    Lab Results  Component Value Date   HGBA1C 6.3 11/30/2014   Lab Results  Component Value Date   CHOL 139 11/30/2014   HDL 43.20 11/30/2014   LDLCALC 74 11/30/2014   LDLDIRECT 109.0 11/30/2014   TRIG 110.0 11/30/2014   CHOLHDL 3 11/30/2014   No results found for: Derl Barrow

## 2014-12-03 NOTE — Assessment & Plan Note (Addendum)
By May 2016 DEXA ,  Tscores -3.3 right femur.  Tolerating alendronate

## 2014-12-03 NOTE — Assessment & Plan Note (Signed)
Well controlled on current regimen. Renal function stable, no changes today.  Lab Results  Component Value Date   CREATININE 1.29* 11/30/2014   Lab Results  Component Value Date   NA 142 11/30/2014   K 3.7 11/30/2014   CL 106 11/30/2014   CO2 26 11/30/2014

## 2014-12-03 NOTE — Assessment & Plan Note (Signed)
No recent episodes.  Uric acid <6.0

## 2014-12-03 NOTE — Assessment & Plan Note (Signed)
Last evaluation was  Oct 2015: Carotid dopplers with 50-69% R-ICA stenosis with plaque in upper CCA, <50% stenosis L-ICA.She has a left carotid bruit on exam today.  Referral to [facility] for evaluation

## 2015-01-04 LAB — HM DIABETES EYE EXAM

## 2015-01-14 ENCOUNTER — Other Ambulatory Visit: Payer: Self-pay | Admitting: Family Medicine

## 2015-01-14 DIAGNOSIS — N183 Chronic kidney disease, stage 3 unspecified: Secondary | ICD-10-CM

## 2015-01-14 DIAGNOSIS — E1122 Type 2 diabetes mellitus with diabetic chronic kidney disease: Secondary | ICD-10-CM

## 2015-01-14 MED ORDER — BLOOD GLUCOSE MONITOR KIT: PACK | Status: AC

## 2015-01-26 ENCOUNTER — Other Ambulatory Visit: Payer: Self-pay | Admitting: Family Medicine

## 2015-01-31 ENCOUNTER — Telehealth: Payer: Self-pay

## 2015-01-31 NOTE — Telephone Encounter (Signed)
Error

## 2015-02-08 ENCOUNTER — Other Ambulatory Visit: Payer: Self-pay | Admitting: Family Medicine

## 2015-02-13 ENCOUNTER — Other Ambulatory Visit: Payer: Self-pay | Admitting: Internal Medicine

## 2015-02-20 ENCOUNTER — Other Ambulatory Visit: Payer: Self-pay | Admitting: Internal Medicine

## 2015-02-21 ENCOUNTER — Other Ambulatory Visit: Payer: Self-pay

## 2015-02-21 MED ORDER — DOXAZOSIN MESYLATE 2 MG PO TABS: 2.0000 mg | ORAL_TABLET | Freq: Every day | ORAL | Status: DC

## 2015-03-04 ENCOUNTER — Other Ambulatory Visit: Payer: Self-pay | Admitting: Internal Medicine

## 2015-03-21 ENCOUNTER — Other Ambulatory Visit: Payer: Self-pay | Admitting: Family Medicine

## 2015-03-31 ENCOUNTER — Other Ambulatory Visit: Payer: Self-pay | Admitting: Internal Medicine

## 2015-04-01 ENCOUNTER — Other Ambulatory Visit: Payer: Self-pay

## 2015-04-01 DIAGNOSIS — F329 Major depressive disorder, single episode, unspecified: Secondary | ICD-10-CM

## 2015-04-01 DIAGNOSIS — F32A Depression, unspecified: Secondary | ICD-10-CM

## 2015-04-01 MED ORDER — ESCITALOPRAM OXALATE 10 MG PO TABS: 10.0000 mg | ORAL_TABLET | Freq: Every day | ORAL | Status: DC

## 2015-04-03 ENCOUNTER — Other Ambulatory Visit: Payer: Self-pay | Admitting: Family Medicine

## 2015-04-22 ENCOUNTER — Other Ambulatory Visit: Payer: Self-pay | Admitting: Family Medicine

## 2015-04-23 ENCOUNTER — Other Ambulatory Visit: Payer: Self-pay | Admitting: Internal Medicine

## 2015-05-14 ENCOUNTER — Other Ambulatory Visit: Payer: Self-pay | Admitting: Internal Medicine

## 2015-05-31 ENCOUNTER — Encounter: Payer: Self-pay | Admitting: Internal Medicine

## 2015-05-31 ENCOUNTER — Ambulatory Visit (INDEPENDENT_AMBULATORY_CARE_PROVIDER_SITE_OTHER): Payer: PPO | Admitting: Internal Medicine

## 2015-05-31 VITALS — BP 158/70 | HR 68 | Temp 97.5°F | Resp 12 | Ht 60.0 in | Wt 137.5 lb

## 2015-05-31 DIAGNOSIS — Z9181 History of falling: Secondary | ICD-10-CM | POA: Insufficient documentation

## 2015-05-31 DIAGNOSIS — E785 Hyperlipidemia, unspecified: Secondary | ICD-10-CM

## 2015-05-31 DIAGNOSIS — R5383 Other fatigue: Secondary | ICD-10-CM

## 2015-05-31 DIAGNOSIS — I63239 Cerebral infarction due to unspecified occlusion or stenosis of unspecified carotid arteries: Secondary | ICD-10-CM

## 2015-05-31 DIAGNOSIS — E559 Vitamin D deficiency, unspecified: Secondary | ICD-10-CM | POA: Diagnosis not present

## 2015-05-31 DIAGNOSIS — N183 Chronic kidney disease, stage 3 unspecified: Secondary | ICD-10-CM

## 2015-05-31 DIAGNOSIS — G8110 Spastic hemiplegia affecting unspecified side: Secondary | ICD-10-CM

## 2015-05-31 DIAGNOSIS — E119 Type 2 diabetes mellitus without complications: Secondary | ICD-10-CM | POA: Diagnosis not present

## 2015-05-31 DIAGNOSIS — E0822 Diabetes mellitus due to underlying condition with diabetic chronic kidney disease: Secondary | ICD-10-CM

## 2015-05-31 DIAGNOSIS — N182 Chronic kidney disease, stage 2 (mild): Secondary | ICD-10-CM

## 2015-05-31 DIAGNOSIS — I10 Essential (primary) hypertension: Secondary | ICD-10-CM

## 2015-05-31 DIAGNOSIS — F3341 Major depressive disorder, recurrent, in partial remission: Secondary | ICD-10-CM

## 2015-05-31 DIAGNOSIS — Z23 Encounter for immunization: Secondary | ICD-10-CM

## 2015-05-31 LAB — CBC WITH DIFFERENTIAL/PLATELET
BASOS PCT: 0.7 % (ref 0.0–3.0)
Basophils Absolute: 0 10*3/uL (ref 0.0–0.1)
EOS PCT: 3.7 % (ref 0.0–5.0)
Eosinophils Absolute: 0.3 10*3/uL (ref 0.0–0.7)
HEMATOCRIT: 35.6 % — AB (ref 36.0–46.0)
HEMOGLOBIN: 11.6 g/dL — AB (ref 12.0–15.0)
LYMPHS PCT: 19.9 % (ref 12.0–46.0)
Lymphs Abs: 1.4 10*3/uL (ref 0.7–4.0)
MCHC: 32.5 g/dL (ref 30.0–36.0)
MCV: 84.1 fl (ref 78.0–100.0)
MONO ABS: 0.5 10*3/uL (ref 0.1–1.0)
MONOS PCT: 6.8 % (ref 3.0–12.0)
Neutro Abs: 4.9 10*3/uL (ref 1.4–7.7)
Neutrophils Relative %: 68.9 % (ref 43.0–77.0)
Platelets: 232 10*3/uL (ref 150.0–400.0)
RBC: 4.24 Mil/uL (ref 3.87–5.11)
RDW: 14.4 % (ref 11.5–15.5)
WBC: 7.1 10*3/uL (ref 4.0–10.5)

## 2015-05-31 LAB — COMPREHENSIVE METABOLIC PANEL
ALT: 8 U/L (ref 0–35)
AST: 11 U/L (ref 0–37)
Albumin: 3.6 g/dL (ref 3.5–5.2)
Alkaline Phosphatase: 69 U/L (ref 39–117)
BUN: 19 mg/dL (ref 6–23)
CALCIUM: 8.8 mg/dL (ref 8.4–10.5)
CHLORIDE: 109 meq/L (ref 96–112)
CO2: 25 meq/L (ref 19–32)
CREATININE: 1.42 mg/dL — AB (ref 0.40–1.20)
GFR: 38.11 mL/min — ABNORMAL LOW (ref >60.00)
Glucose, Bld: 142 mg/dL — ABNORMAL HIGH (ref 70–99)
POTASSIUM: 3.8 meq/L (ref 3.5–5.1)
Sodium: 142 mEq/L (ref 135–145)
Total Bilirubin: 0.2 mg/dL (ref 0.2–1.2)
Total Protein: 5.8 g/dL — ABNORMAL LOW (ref 6.0–8.3)

## 2015-05-31 LAB — HEMOGLOBIN A1C: HEMOGLOBIN A1C: 6.4 % (ref 4.6–6.5)

## 2015-05-31 LAB — LIPID PANEL
CHOL/HDL RATIO: 6
Cholesterol: 221 mg/dL — ABNORMAL HIGH (ref 0–200)
HDL: 39.3 mg/dL (ref >39.00)
NONHDL: 182.07
Triglycerides: 246 mg/dL — ABNORMAL HIGH (ref 0.0–149.0)
VLDL: 49.2 mg/dL — AB (ref 0.0–40.0)

## 2015-05-31 LAB — LDL CHOLESTEROL, DIRECT: LDL DIRECT: 143 mg/dL

## 2015-05-31 MED ORDER — TETANUS-DIPHTH-ACELL PERTUSSIS 5-2.5-18.5 LF-MCG/0.5 IM SUSP: 0.5000 mL | Freq: Once | INTRAMUSCULAR | Status: DC

## 2015-05-31 NOTE — Assessment & Plan Note (Signed)
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 Will increase the potency of her statin unless LFTs are elveated or LDL < 70

## 2015-05-31 NOTE — Assessment & Plan Note (Addendum)
Elevated today. Patient asked to check BP 5 times over the next 4 weeks and report readings.

## 2015-05-31 NOTE — Patient Instructions (Addendum)
Your BP  Is elevated.  Check your BP 5 times over the next month and contact me with the readings.   For your allergies ,  You can use Benadryl but you should also consider adding one of these newer second generation antihistamines that are longer acting, non sedating and  available OTC:  Generic  Zyrtec, which is cetirizine.    generic Allegra , available generically as fexofenadine ; comes in 60 mg and 180 mg once daily strengths.    Generic Claritin :  also available as loratidine .   You can take excedrin , but you do not need to take your aspirin on that day   I want you walking for 15 to 30 minutes daily  For exercise.

## 2015-05-31 NOTE — Progress Notes (Signed)
Subjective:  Patient ID: Candace Monroe, female    DOB: 07-14-1940  Age: 75 y.o. MRN: [id number]  CC: The primary encounter diagnosis was Diabetes mellitus without complication ([name]). 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  HPI [name] presents for follow up on Type 2 D M with CKD,  Hypertension, CVD with prior stroke and PAD   Was referred to [facility] after Nov visit for eval of carotid bruit on left side.  Had doppler done . Report showed 50-69% stenosis of both  Carotid arteries .  She is accompanied by her husband who is concerned that she is sleeping all the time. She vehemently disagrees, but has been increasingly sedentary since her stroke due to dominant side weakness.  Snores, no apneic spells ,  Restless sleeper. Occasional insomnia ,  No restless legs or pain syndromes.  Not doing the cooking and cleaning.  BP is  elevated today   BS was  92 this morning  And all fastings have been < 125.   Does not check other times  Of the day.  No lows.   Last meal early ,  Fasting labs today  .  Her last eye exam january 2017  Seeing a  retinal specialist for left eye retina problems every 6 months   [name] right ankle turned black  down to toes,  And swelled,  Bu t all pain and swelling has resolved. .          Outpatient Prescriptions Prior to Visit  Medication Sig Dispense  Refill  . alendronate (FOSAMAX) 70 MG tablet TK 1 T PO  Q WEEK  3  . amitriptyline (ELAVIL) 10 MG tablet TAKE 1 TABLET BY MOUTH AT BEDTIME AS NEEDED FOR INSOMNIA 30 tablet 2  . amLODipine (NORVASC) 10 MG tablet TAKE 1 TABLET BY MOUTH EVERY DAY 90 tablet 3  . amLODipine (NORVASC) 10 MG tablet TAKE 1 TABLET BY MOUTH EVERY DAY 90 tablet 3  . aspirin 81 MG tablet Take 81 mg by mouth daily.    . blood glucose meter kit and supplies KIT Dispense based on patient and insurance preference. Check sugar daily. 1 each 0  . doxazosin (CARDURA) 2 MG tablet Take 1 tablet (2 mg total) by mouth at bedtime. 90 tablet 1  . escitalopram (LEXAPRO) 10 MG tablet Take 1 tablet (10 mg total) by mouth daily. 30 tablet 5  . famotidine (PEPCID) 20 MG tablet TAKE 1 TABLET BY MOUTH EVERY DAY 90 tablet 3  . fluticasone (FLONASE) 50 MCG/ACT nasal spray Place 1 spray into both nostrils daily.    Marland Kitchen FREESTYLE LITE test strip USE AS DIRECTED ONCE DAILY 100 each 0  . Lancets (FREESTYLE) lancets USE AS DIRECTED ONCE DAILY 100 each 0  . senna-docusate (SENOKOT-S) 8.6-50 MG per tablet Take 2 tablets by mouth at bedtime. 60 tablet 0  . simvastatin (ZOCOR) 20 MG tablet TAKE 1 TABLET BY MOUTH EVERY DAY 90 tablet 0  . Vitamin D,  Ergocalciferol, (DRISDOL) 50000 units CAPS capsule TAKE 1 CAPSULE BY MOUTH ONCE A WEEK 12 capsule 3   No facility-administered medications prior to visit.    Review of Systems;  Patient denies headache, fevers, malaise, unintentional weight loss, skin rash, eye pain, sinus congestion and sinus pain, sore throat, dysphagia,  hemoptysis , cough, dyspnea, wheezing, chest pain, palpitations, orthopnea, edema, abdominal pain, nausea, melena, diarrhea, constipation, flank pain, dysuria, hematuria, urinary  Frequency, nocturia, numbness, tingling, seizures,  Focal weakness, Loss of consciousness,  Tremor, insomnia, depression, anxiety, and suicidal ideation.      Objective:  BP 158/70 mmHg  Pulse 68  Temp(Src)  97.5 F (36.4 C) (Oral)  Resp 12  Ht 5' (1.524 m)  Wt 137 lb 8 oz (62.37 kg)  BMI 26.85 kg/m2  SpO2 98%  BP Readings from Last 3 Encounters:  05/31/15 158/70  12/01/14 138/62  09/29/14 148/78    Wt Readings from Last 3 Encounters:  05/31/15 137 lb 8 oz (62.37 kg)  12/01/14 133 lb 8 oz (60.555 kg)  09/29/14 135 lb 8 oz (61.462 kg)    General appearance: alert, cooperative and appears stated age Ears: normal TM's and external ear canals both ears Throat: lips, mucosa, and tongue normal; teeth and gums normal Neck: no adenopathy, no carotid bruit, supple, symmetrical, trachea midline and thyroid not enlarged, symmetric, no tenderness/mass/nodules Back: symmetric, no curvature. ROM normal. No CVA tenderness. Lungs: clear to auscultation bilaterally Heart: regular rate and rhythm, S1, S2 normal, no murmur, click, rub or gallop Abdomen: soft, non-tender; bowel sounds normal; no masses,  no organomegaly Pulses: 2+ and symmetric Skin: Skin color, texture, turgor normal. No rashes or lesions Lymph nodes: Cervical, supraclavicular, and axillary nodes normal.  Lab Results  Component Value Date   HGBA1C 6.3 11/30/2014   HGBA1C 6.2* 10/05/2013    Lab Results  Component Value Date   CREATININE 1.29* 11/30/2014   CREATININE 1.20* 10/17/2013   CREATININE 1.25* 10/15/2013    Lab Results  Component Value Date   WBC 8.4 11/30/2014   HGB 13.0 11/30/2014   HCT 39.6 11/30/2014   PLT 229.0 11/30/2014   GLUCOSE 100* 11/30/2014   CHOL 139 11/30/2014   TRIG 110.0 11/30/2014   HDL 43.20 11/30/2014   LDLDIRECT 109.0 11/30/2014   LDLCALC 74 11/30/2014   ALT 9 11/30/2014   AST 12 11/30/2014   NA 142 11/30/2014   K 3.7 11/30/2014   CL 106 11/30/2014   CREATININE 1.29* 11/30/2014   BUN 19 11/30/2014   CO2 26 11/30/2014   HGBA1C 6.3 11/30/2014    Dg Bone Density  05/05/2014  EXAM: DUAL X-RAY ABSORPTIOMETRY (DXA) FOR BONE MINERAL DENSITY IMPRESSION: Dear Dr [name], Your patient  [name] completed a BMD test on 05/05/2014 using the [place] (analysis version: 14.10) manufactured by [employer]. The following summarizes the results of our evaluation. PATIENT BIOGRAPHICAL: Name: Annalyssa, Thune Patient ID: [id number] Birth Date: 06/26/1940 Height: 60.0 in. Gender: Female Exam Date: 05/05/2014 Weight: 132.0 lbs. Indications: Advanced Age, Caucasian, Height Loss, Hysterectomy, Postmenopausal Fractures: Treatments: ASPRIN 81 MG, Prevacid ASSESSMENT: The BMD measured at Femur Neck Right is 0.581 g/cm2 with a T-score of -3.3. This patient is considered OSTEOPOROTIC according to [facility]) criteria. Site Region Measured Measured WHO Young Adult BMD Date       Age      Classification T-score AP Spine L1-L2 05/05/2014 [age] Osteoporosis -3.3 0.777 g/cm2 DualFemur Neck Right 05/05/2014 76.0 Osteoporosis -3.3 0.581 g/cm2  World Pharmacologist (WHO) criteria for post-menopausal, Caucasian Women: Normal:       T-score at or above -1 SD Osteopenia:   T-score between -1 and -2.5 SD Osteoporosis: T-score at or below -2.5 SD RECOMMENDATIONS: [place] recommends that FDA-approved medical therapies be considered in postmenopausal women and men age 99 or older with a: 1. Hip or vertebral (clinical or morphometric) fracture. 2. T-score of < -2.5 at the spine or hip. 3. Ten-year fracture probability by FRAX of 3% or greater for hip fracture or 20% or greater for major osteoporotic fracture (patient did not meet criteria for FRAX assessment). All treatment decisions require clinical judgment and consideration of individual patient factors, including patient preferences, co-morbidities, previous drug use, risk factors not captured in the FRAX model (e.g. falls, vitamin D deficiency, increased bone turnover, interval significant decline in bone density) and possible under - or over-estimation of fracture risk by FRAX. All patients should ensure an  adequate intake of dietary calcium (1200 mg/d) and vitamin D (800 IU daily) unless contraindicated. FOLLOW-UP: People with diagnosed cases of osteoporosis or at high risk for fracture should have regular bone mineral density tests. For patients eligible for Medicare, routine testing is allowed once every 2 years. The testing frequency can be increased to one year for patients who have rapidly progressing disease, those who are receiving or discontinuing medical therapy to restore bone mass, or have additional risk factors. I have reviewed this report, and agree with the above findings. Mark A. Thornton Papas, M.D. Certified [facility] Electronically Signed   By: Lavonia Dana M.D.   On: 05/05/2014 09:56    Assessment & Plan:   Problem List Items Addressed This Visit    Spastic hemiparesis affecting dominant side ([place])    Secondary to right brain CVA .  She continues to endorse weakness limiting her ability to perform all household chores. encouarged to walk for 15-30 minutes daily         HTN (hypertension)    Elevated today. Patient asked to check BP 5 times over the next 4 weeks and report readings.       Major depressive disorder, recurrent episode, in partial remission ([place])    Managed with lexapro,  No changes today      Diabetes mellitus with chronic kidney disease ([place])    well-controlled on current medications.  hemoglobin A1c has been consistently at or  less than 7.0 . Patient is up-to-date on eye exams and foot exam is normal today. Patient is due for urine microalbumin to creatinine ratio and hgba1c.  Patient is tolerating statin therapy for CAD risk reduction , but is not on ACE/ARB  Because of prior intolerance.    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 Will increase the potency of her statin unless LFTs are elveated or LDL < 70      History of fall within past 90 days    Other Visit Diagnoses    Diabetes mellitus without complication ([place])    -  Primary  Relevant Orders    Lipid panel    Hemoglobin A1c    Microalbumin / creatinine urine ratio    CKD (chronic kidney disease) stage 3, GFR 30-59 ml/min        Relevant Orders    Comprehensive metabolic panel    Hyperlipidemia        Relevant Orders    Lipid panel    LDL cholesterol, direct    Other fatigue        Relevant Orders    CBC with Differential/Platelet    Hepatitis C antibody    Vitamin D deficiency        Relevant Orders    VITAMIN D 25 Hydroxy (Vit-D Deficiency, Fractures)    Need for prophylactic vaccination against Streptococcus pneumoniae (pneumococcus)        Relevant Orders    Pneumococcal polysaccharide vaccine 23-valent greater than or equal to 2yo subcutaneous/IM (Completed)       I am having Ms. Lovins start on Tdap. I am also having her maintain her senna-docusate, aspirin, fluticasone, alendronate, simvastatin, blood glucose meter kit and supplies, amLODipine, famotidine, amLODipine, doxazosin, amitriptyline, escitalopram, FREESTYLE LITE, freestyle, and Vitamin D (Ergocalciferol).  Meds ordered this encounter  Medications  . Tdap (BOOSTRIX) 5-2.5-18.5 LF-MCG/0.5 injection    Sig: Inject 0.5 mLs into the muscle once.    Dispense:  0.5 mL    Refill:  0    There are no discontinued medications.  Follow-up: No Follow-up on file.   Crecencio Mc, MD   Was sent to [facility] after NOv visit for evaluatio nof

## 2015-05-31 NOTE — Assessment & Plan Note (Signed)
well-controlled on current medications.  hemoglobin A1c has been consistently at or  less than 7.0 . Patient is up-to-date on eye exams and foot exam is normal today. Patient is due for urine microalbumin to creatinine ratio and hgba1c.  Patient is tolerating statin therapy for CAD risk reduction , but is not on ACE/ARB  Because of prior intolerance.    Lab Results  Component Value Date   HGBA1C 6.3 11/30/2014   Lab Results  Component Value Date   CHOL 139 11/30/2014   HDL 43.20 11/30/2014   LDLCALC 74 11/30/2014   LDLDIRECT 109.0 11/30/2014   TRIG 110.0 11/30/2014   CHOLHDL 3 11/30/2014   No results found for: Derl Barrow

## 2015-05-31 NOTE — Assessment & Plan Note (Signed)
Managed with lexapro,  No changes today 

## 2015-05-31 NOTE — Progress Notes (Signed)
Pre-visit discussion using our clinic review tool. No additional management support is needed unless otherwise documented below in the visit note.  

## 2015-05-31 NOTE — Assessment & Plan Note (Signed)
Secondary to right brain CVA .  She continues to endorse weakness limiting her ability to perform all household chores. encouarged to walk for 15-30 minutes daily

## 2015-06-01 ENCOUNTER — Other Ambulatory Visit (INDEPENDENT_AMBULATORY_CARE_PROVIDER_SITE_OTHER): Payer: PPO

## 2015-06-01 DIAGNOSIS — E119 Type 2 diabetes mellitus without complications: Secondary | ICD-10-CM

## 2015-06-01 LAB — MICROALBUMIN / CREATININE URINE RATIO
Creatinine,U: 59.1 mg/dL
Microalb Creat Ratio: 165 mg/g — ABNORMAL HIGH (ref 0.0–30.0)
Microalb, Ur: 97.6 mg/dL — ABNORMAL HIGH (ref 0.0–1.9)

## 2015-06-01 LAB — VITAMIN D 25 HYDROXY (VIT D DEFICIENCY, FRACTURES): VITD: 62.62 ng/mL (ref 30.00–100.00)

## 2015-06-01 LAB — HEPATITIS C ANTIBODY: HCV AB: NEGATIVE

## 2015-06-03 ENCOUNTER — Other Ambulatory Visit: Payer: Self-pay | Admitting: Internal Medicine

## 2015-06-03 DIAGNOSIS — N183 Chronic kidney disease, stage 3 (moderate): Secondary | ICD-10-CM

## 2015-06-03 DIAGNOSIS — E1121 Type 2 diabetes mellitus with diabetic nephropathy: Secondary | ICD-10-CM | POA: Insufficient documentation

## 2015-06-03 DIAGNOSIS — E785 Hyperlipidemia, unspecified: Secondary | ICD-10-CM | POA: Insufficient documentation

## 2015-06-03 MED ORDER — LOSARTAN POTASSIUM 25 MG PO TABS: 25.0000 mg | ORAL_TABLET | Freq: Every day | ORAL | Status: DC

## 2015-06-03 MED ORDER — ATORVASTATIN CALCIUM 20 MG PO TABS: 20.0000 mg | ORAL_TABLET | Freq: Every day | ORAL | Status: DC

## 2015-06-03 NOTE — Assessment & Plan Note (Signed)
Changing from simvastatin to atorvastatin for elevated LDL and history of CVA  And bilateral carotid artery stenosis.

## 2015-06-14 ENCOUNTER — Other Ambulatory Visit (INDEPENDENT_AMBULATORY_CARE_PROVIDER_SITE_OTHER): Payer: PPO

## 2015-06-14 DIAGNOSIS — E119 Type 2 diabetes mellitus without complications: Secondary | ICD-10-CM | POA: Diagnosis not present

## 2015-06-14 DIAGNOSIS — N183 Chronic kidney disease, stage 3 (moderate): Secondary | ICD-10-CM

## 2015-06-14 DIAGNOSIS — E0822 Diabetes mellitus due to underlying condition with diabetic chronic kidney disease: Secondary | ICD-10-CM

## 2015-06-14 LAB — COMPREHENSIVE METABOLIC PANEL
ALK PHOS: 61 U/L (ref 39–117)
ALT: 9 U/L (ref 0–35)
AST: 10 U/L (ref 0–37)
Albumin: 3.7 g/dL (ref 3.5–5.2)
BILIRUBIN TOTAL: 0.6 mg/dL (ref 0.2–1.2)
BUN: 18 mg/dL (ref 6–23)
CALCIUM: 9.1 mg/dL (ref 8.4–10.5)
CO2: 25 mEq/L (ref 19–32)
Chloride: 107 mEq/L (ref 96–112)
Creatinine, Ser: 1.36 mg/dL — ABNORMAL HIGH (ref 0.40–1.20)
GFR: 40.06 mL/min — AB (ref >60.00)
Glucose, Bld: 115 mg/dL — ABNORMAL HIGH (ref 70–99)
POTASSIUM: 3.9 meq/L (ref 3.5–5.1)
Sodium: 142 mEq/L (ref 135–145)
TOTAL PROTEIN: 5.9 g/dL — AB (ref 6.0–8.3)

## 2015-06-14 LAB — LIPID PANEL
CHOLESTEROL: 142 mg/dL (ref 0–200)
HDL: 45.7 mg/dL (ref >39.00)
LDL Cholesterol: 78 mg/dL (ref 0–99)
NonHDL: 96.25
TRIGLYCERIDES: 89 mg/dL (ref 0.0–149.0)
Total CHOL/HDL Ratio: 3
VLDL: 17.8 mg/dL (ref 0.0–40.0)

## 2015-06-14 LAB — BASIC METABOLIC PANEL
BUN: 18 mg/dL (ref 6–23)
CHLORIDE: 107 meq/L (ref 96–112)
CO2: 25 meq/L (ref 19–32)
Calcium: 9.1 mg/dL (ref 8.4–10.5)
Creatinine, Ser: 1.36 mg/dL — ABNORMAL HIGH (ref 0.40–1.20)
GFR: 40.06 mL/min — ABNORMAL LOW (ref >60.00)
GLUCOSE: 115 mg/dL — AB (ref 70–99)
POTASSIUM: 3.9 meq/L (ref 3.5–5.1)
SODIUM: 142 meq/L (ref 135–145)

## 2015-06-14 LAB — MICROALBUMIN / CREATININE URINE RATIO
Creatinine,U: 125.1 mg/dL
MICROALB/CREAT RATIO: 169.4 mg/g — AB (ref 0.0–30.0)
Microalb, Ur: 211.9 mg/dL — ABNORMAL HIGH (ref 0.0–1.9)

## 2015-06-14 LAB — LDL CHOLESTEROL, DIRECT: LDL DIRECT: 82 mg/dL

## 2015-06-14 LAB — HEMOGLOBIN A1C: Hgb A1c MFr Bld: 6.2 % (ref 4.6–6.5)

## 2015-06-17 ENCOUNTER — Encounter: Payer: Self-pay | Admitting: *Deleted

## 2015-06-27 ENCOUNTER — Other Ambulatory Visit: Payer: Self-pay | Admitting: Internal Medicine

## 2015-07-22 ENCOUNTER — Other Ambulatory Visit: Payer: Self-pay | Admitting: Internal Medicine

## 2015-07-30 ENCOUNTER — Other Ambulatory Visit: Payer: Self-pay | Admitting: Family Medicine

## 2015-08-01 NOTE — Telephone Encounter (Signed)
Review. KW 

## 2015-08-17 ENCOUNTER — Other Ambulatory Visit: Payer: Self-pay | Admitting: Internal Medicine

## 2015-09-28 ENCOUNTER — Other Ambulatory Visit: Payer: Self-pay | Admitting: Internal Medicine

## 2015-09-28 DIAGNOSIS — F32A Depression, unspecified: Secondary | ICD-10-CM

## 2015-09-28 DIAGNOSIS — F329 Major depressive disorder, single episode, unspecified: Secondary | ICD-10-CM

## 2015-10-19 IMAGING — US US BREAST*L* LIMITED INC AXILLA
1 series · 13 of 16 positions shown · non-contrast
Comparison: 09/23/2013, 06/05/2012, 04/23/2011, 01/25/2010

CLINICAL DATA: 73-year-old female, callback from screening
mammogram for possible left breast mass

EXAM:
DIGITAL DIAGNOSTIC  LEFT MAMMOGRAM WITH CAD
ULTRASOUND LEFT BREAST

[Series 1: us breast*left* limited inc axilla · 0.08mm/px · 13 of 16 slices shown]
[im 1/16]
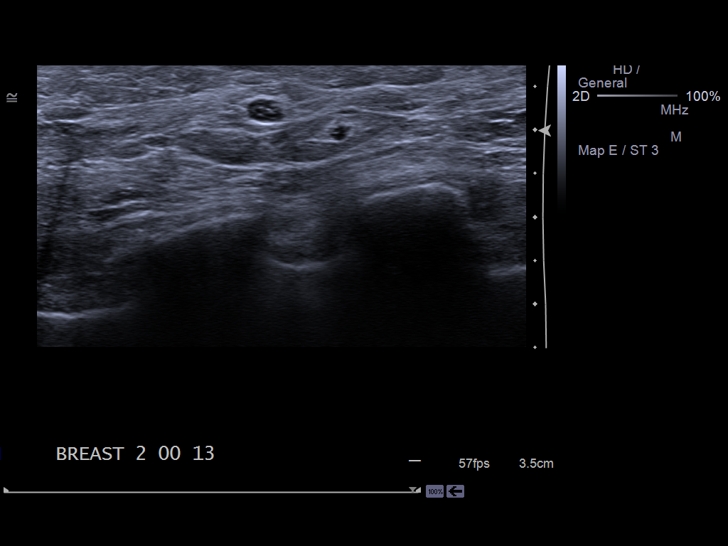
[im 2/16]
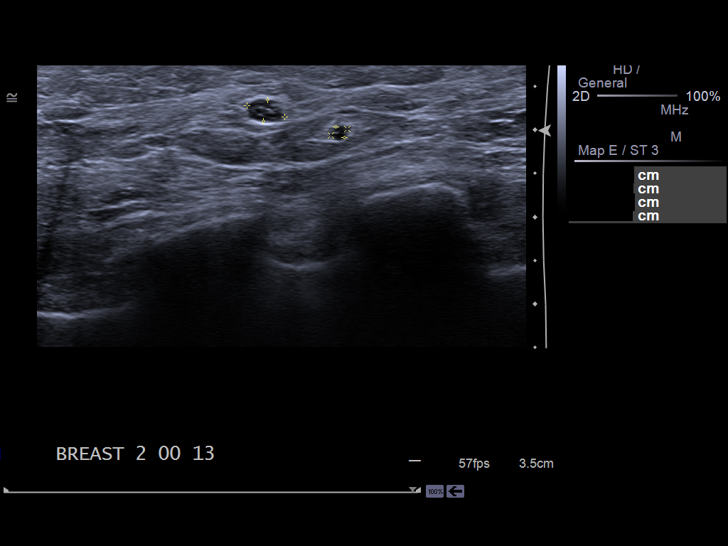
[im 4/16]
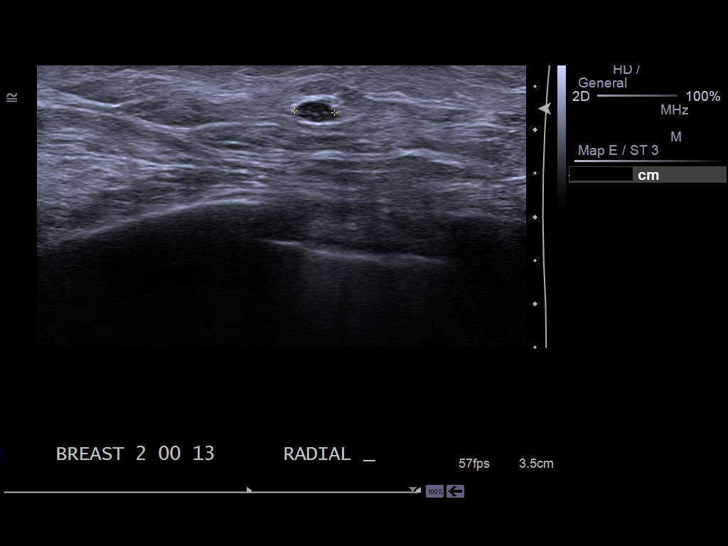
[im 5/16]
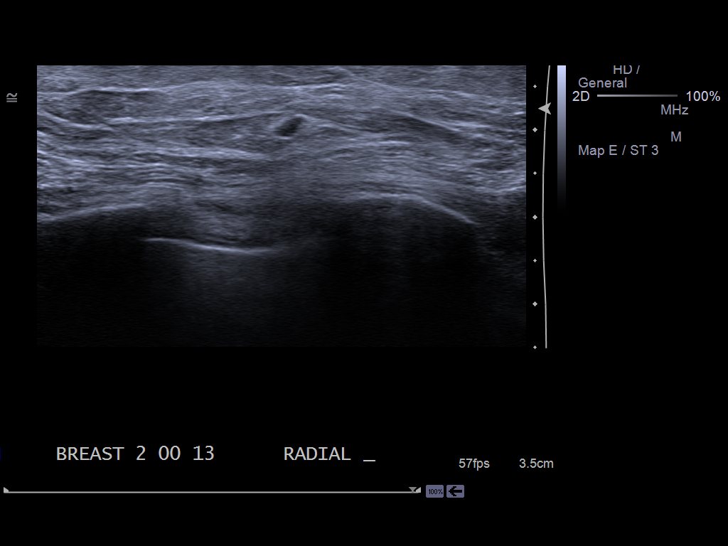
[im 6/16]
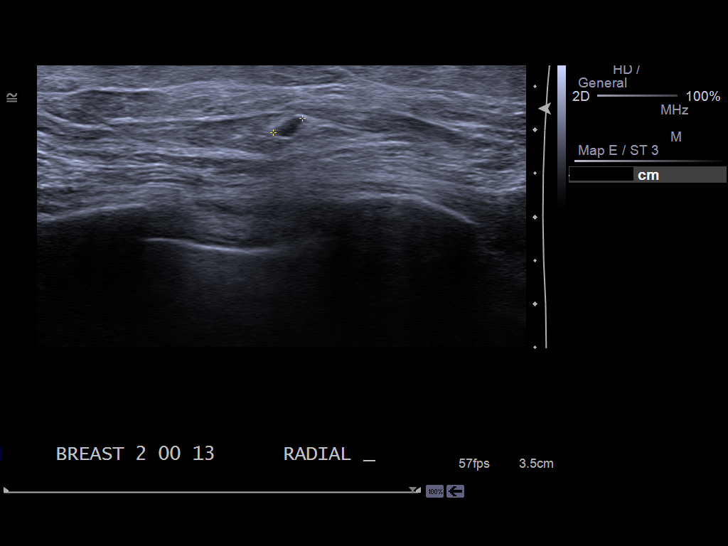
[im 7/16]
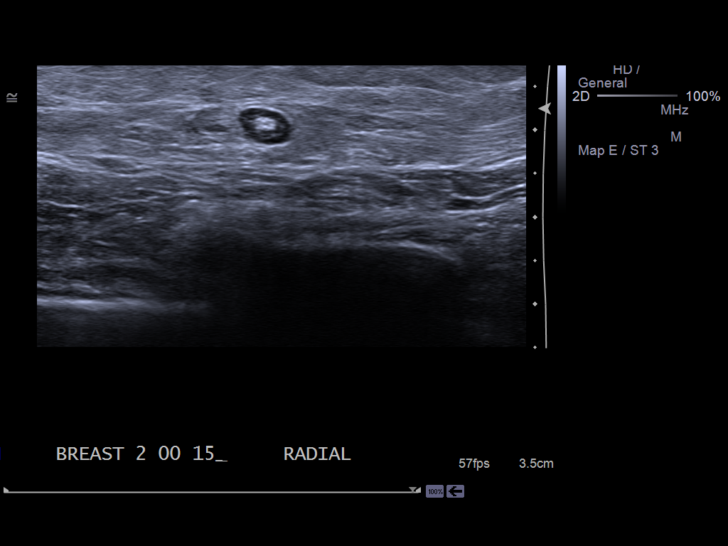
[im 9/16]
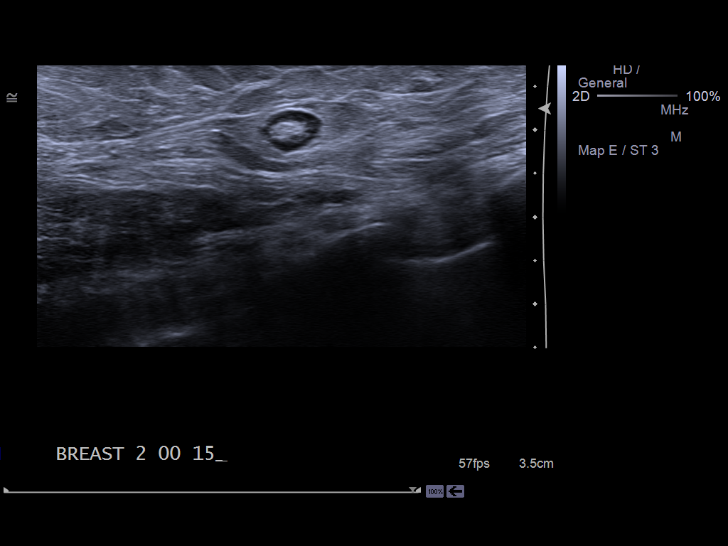
[im 10/16]
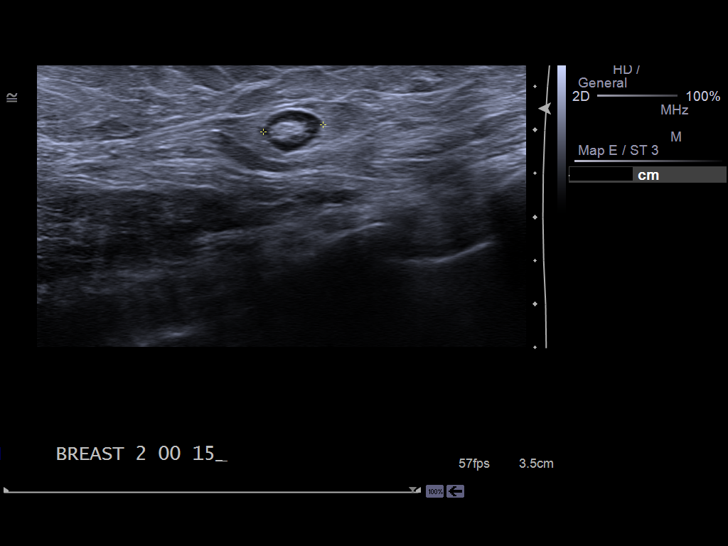
[im 11/16]
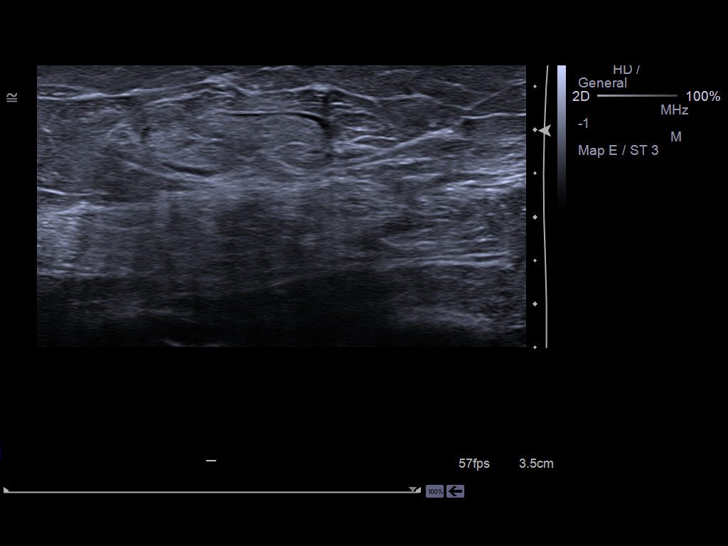
[im 12/16]
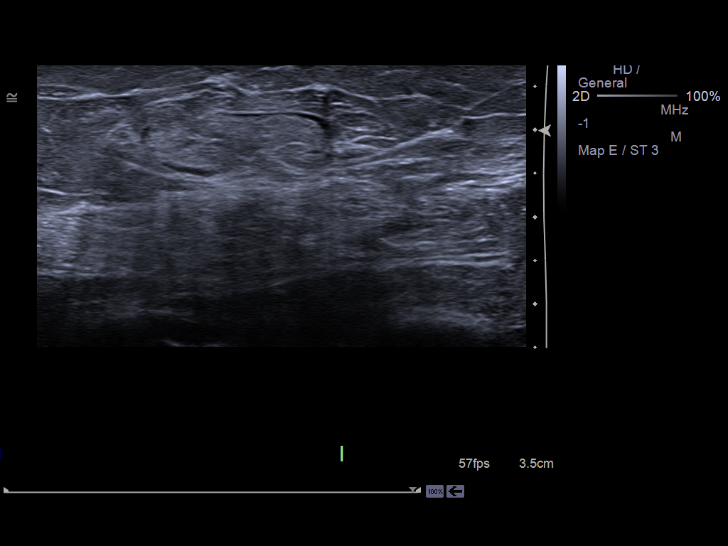
[im 13/16]
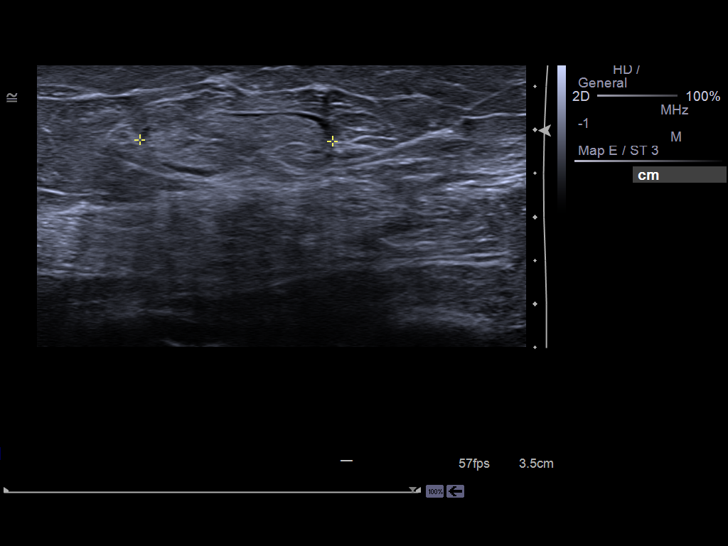
[im 15/16]
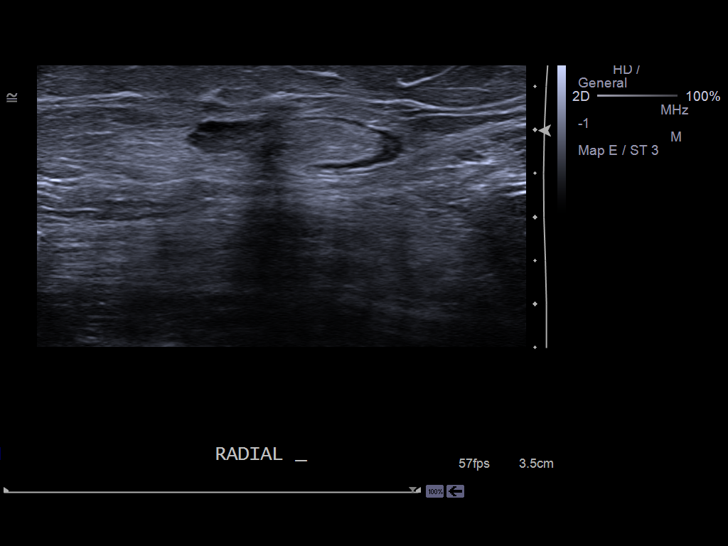
[im 16/16]
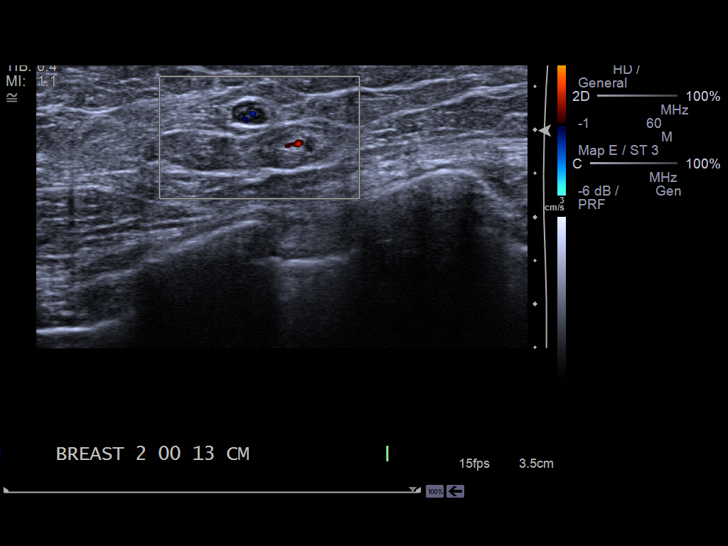

[13 of 16 positions shown; findings below may reference images not displayed]

ACR Breast Density Category c: The breast tissue is heterogeneously
dense, which may obscure small masses.
FINDINGS: On additional views, four oval masses are seen within the upper,
outer left breast, 2 of which are consistent with lymph nodes. The 2
smaller oval masses demonstrate indistinct margins and measure
approximately 5 and 3 mm.

Mammographic images were processed with CAD.

Targeted ultrasound of the upper, outer left breast was performed.
Two similar appearing hypoechoic oval masses with indistinct margins
are seen within the left breast at 2 o'clock, 13 cm from the nipple
measuring 5 x 3 x 5 mm and 3 x 2 x 4 mm. The larger of the masses
demonstrates internal vascularity.

At 2 o'clock, 15 cm from the nipple, a normal appearing lymph node
is seen measuring 7 mm in maximal size. An additional lymph node is
seen within the left axillary tail corresponding to the larger
mammographic lymph node. This lymph node demonstrates thickened
cortices at its poles but may be normal as it appears
mammographically stable dating back to 1331.
IMPRESSION: 1. Indeterminate left breast mass at 2 o'clock, 13 cm from the
nipple.
2. Indeterminate left axillary lymph node.

RECOMMENDATION:
1. Ultrasound-guided biopsy of the larger mass at 2 o'clock, 13 cm
from the nipple is recommended. If this mass demonstrates benign
results, the smaller adjacent mass can likely be followed as it is
similar in morphology.
2. Ultrasound-guided biopsy of the left axillary lymph node
demonstrating cortical thickening.

I have discussed the findings and recommendations with the patient.
Results were also provided in writing at the conclusion of the
visit. If applicable, a reminder letter will be sent to the patient
regarding the next appointment.

BI-RADS CATEGORY  4: Suspicious.

## 2015-10-26 ENCOUNTER — Other Ambulatory Visit: Payer: Self-pay | Admitting: Internal Medicine

## 2015-11-27 ENCOUNTER — Other Ambulatory Visit: Payer: Self-pay | Admitting: Internal Medicine

## 2015-12-14 LAB — HM DIABETES EYE EXAM

## 2015-12-26 ENCOUNTER — Other Ambulatory Visit: Payer: Self-pay | Admitting: Internal Medicine

## 2016-01-26 ENCOUNTER — Other Ambulatory Visit: Payer: Self-pay | Admitting: Internal Medicine

## 2016-01-26 DIAGNOSIS — R69 Illness, unspecified: Secondary | ICD-10-CM | POA: Diagnosis not present

## 2016-01-27 ENCOUNTER — Encounter: Payer: Self-pay | Admitting: Internal Medicine

## 2016-02-07 ENCOUNTER — Other Ambulatory Visit: Payer: Self-pay | Admitting: Internal Medicine

## 2016-02-07 NOTE — Telephone Encounter (Signed)
Refilled for 90 days,  please  call and schedule an appt

## 2016-02-07 NOTE — Telephone Encounter (Signed)
Pt last refill on Norvasc was on 11/08/15. Pt last OV was 06/01/15 and last labs were on 06/14/15. Pt has no future appts at this time. Ok to refill?

## 2016-02-08 NOTE — Telephone Encounter (Signed)
Could you call pt and schedule OV for anymore further refills? Thank  You.

## 2016-02-08 NOTE — Telephone Encounter (Signed)
Called pt and lm on vm to schedule an OV before next refill.

## 2016-02-13 ENCOUNTER — Other Ambulatory Visit: Payer: Self-pay | Admitting: Internal Medicine

## 2016-02-22 ENCOUNTER — Encounter: Payer: Self-pay | Admitting: Internal Medicine

## 2016-02-22 ENCOUNTER — Ambulatory Visit (INDEPENDENT_AMBULATORY_CARE_PROVIDER_SITE_OTHER): Payer: Medicare HMO | Admitting: Internal Medicine

## 2016-02-22 VITALS — BP 160/70 | HR 79 | Resp 16 | Wt 128.0 lb

## 2016-02-22 DIAGNOSIS — I63239 Cerebral infarction due to unspecified occlusion or stenosis of unspecified carotid arteries: Secondary | ICD-10-CM | POA: Diagnosis not present

## 2016-02-22 DIAGNOSIS — N182 Chronic kidney disease, stage 2 (mild): Secondary | ICD-10-CM | POA: Diagnosis not present

## 2016-02-22 DIAGNOSIS — I10 Essential (primary) hypertension: Secondary | ICD-10-CM | POA: Diagnosis not present

## 2016-02-22 DIAGNOSIS — E0822 Diabetes mellitus due to underlying condition with diabetic chronic kidney disease: Secondary | ICD-10-CM | POA: Diagnosis not present

## 2016-02-22 DIAGNOSIS — E559 Vitamin D deficiency, unspecified: Secondary | ICD-10-CM

## 2016-02-22 DIAGNOSIS — G8110 Spastic hemiplegia affecting unspecified side: Secondary | ICD-10-CM

## 2016-02-22 DIAGNOSIS — E785 Hyperlipidemia, unspecified: Secondary | ICD-10-CM

## 2016-02-22 DIAGNOSIS — E1169 Type 2 diabetes mellitus with other specified complication: Secondary | ICD-10-CM

## 2016-02-22 LAB — POCT GLYCOSYLATED HEMOGLOBIN (HGB A1C): HEMOGLOBIN A1C: 5.8

## 2016-02-22 LAB — COMPREHENSIVE METABOLIC PANEL
ALBUMIN: 4 g/dL (ref 3.5–5.2)
ALK PHOS: 73 U/L (ref 39–117)
ALT: 8 U/L (ref 0–35)
AST: 12 U/L (ref 0–37)
BUN: 21 mg/dL (ref 6–23)
CHLORIDE: 108 meq/L (ref 96–112)
CO2: 24 mEq/L (ref 19–32)
Calcium: 9.5 mg/dL (ref 8.4–10.5)
Creatinine, Ser: 1.53 mg/dL — ABNORMAL HIGH (ref 0.40–1.20)
GFR: 34.9 mL/min — AB (ref >60.00)
Glucose, Bld: 109 mg/dL — ABNORMAL HIGH (ref 70–99)
POTASSIUM: 3.9 meq/L (ref 3.5–5.1)
SODIUM: 141 meq/L (ref 135–145)
Total Bilirubin: 0.6 mg/dL (ref 0.2–1.2)
Total Protein: 6.5 g/dL (ref 6.0–8.3)

## 2016-02-22 LAB — LIPID PANEL
Cholesterol: 135 mg/dL (ref 0–200)
HDL: 43.3 mg/dL (ref >39.00)
LDL Cholesterol: 71 mg/dL (ref 0–99)
NONHDL: 91.9
TRIGLYCERIDES: 104 mg/dL (ref 0.0–149.0)
Total CHOL/HDL Ratio: 3
VLDL: 20.8 mg/dL (ref 0.0–40.0)

## 2016-02-22 LAB — VITAMIN D 25 HYDROXY (VIT D DEFICIENCY, FRACTURES): VITD: 44.99 ng/mL (ref 30.00–100.00)

## 2016-02-22 LAB — LDL CHOLESTEROL, DIRECT: LDL DIRECT: 73 mg/dL

## 2016-02-22 MED ORDER — FAMOTIDINE 20 MG PO TABS: 20.0000 mg | ORAL_TABLET | Freq: Every day | ORAL | 3 refills | Status: DC

## 2016-02-22 MED ORDER — LOSARTAN POTASSIUM 50 MG PO TABS: 50.0000 mg | ORAL_TABLET | Freq: Every day | ORAL | 1 refills | Status: DC

## 2016-02-22 NOTE — Patient Instructions (Addendum)
Your diabetes is VERY well controlled ,  But your BLOOD PRESSURE IS TOO HIGH!  PLEASE Increase you losartan dose to 50 mg daily  Check blood pressure once daily starting in a week and send me the readings in 2 weeks    Home health Physical therapy has been ordered   Return in 6 months

## 2016-02-22 NOTE — Progress Notes (Signed)
Pre visit review using our clinic review tool, if applicable. No additional management support is needed unless otherwise documented below in the visit note. 

## 2016-02-22 NOTE — Progress Notes (Signed)
Subjective:  Patient ID: Candace Monroe, female    DOB: Sep 08, 1940  Age: 76 y.o. MRN: [id number]  CC: The primary encounter diagnosis was Vitamin D deficiency. 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  HPI Candace Monroe presents for follow up on  Multiple issues.  She is accompanied b y her husband.  She does not drive,  And uses a walker since her CVA    Prediabetes/type 2 dm diet controlled .  Patient is following a low glycemic index diet and taking all prescribed medications regularly without side effects.  Fasting sugars have been under less than 140 most of the time and post prandials have been under 160 except on rare occasions. Patient does not exercise and has lost strength in her legs since home PT was discontinued.  Patient has had an eye exam in the last 12 months and checks feet regularly for signs of infection.  Patient does not walk barefoot outside,  And denies an numbness tingling or burning in feet. Patient is up to date on all recommended vaccinations  Lab Results  Component Value Date   HGBA1C 5.8 02/22/2016   Last eye exam recently sees a retina specialist. Left retinal tear history s/p laser surggy x 2,  Next appt in June .Fasting sugars 83 to 90.  Not fasting today . Has lost 9 lbs since last visit  Unintentionally.  She is not skipping meals.  Diet reviewed:  Breakfast is a pack of crackers,  Cereal or oatmeal,  No eggs but eats peanut butter,  .  Lunch is tuna fish sandwhich,  Dinner is a  [employer] /slaw   Post CVA sept 2015 Has not had home PT in 2 years . husband  has noticed increased weakness   But no falls in 4 months  Using the walker in  the house.   Using the bathtub independently  Because it has safety bar and a bench. Has a commode in the bedroom .    bp elevated for the past 2 years!!   Outpatient Medications Prior to Visit  Medication Sig Dispense Refill  . alendronate (FOSAMAX) 70 MG tablet TK 1 T PO  Q WEEK  3  . amitriptyline (ELAVIL) 10 MG tablet TAKE 1 TABLET BY MOUTH AT BEDTIME AS NEEDED FOR INSOMNIA 30 tablet 3  . amLODipine (NORVASC) 10 MG tablet TAKE 1 TABLET BY MOUTH EVERY DAY 90 tablet 3  . amLODipine (NORVASC) 10 MG tablet TAKE 1 TABLET BY MOUTH EVERY DAY 90 tablet 0  . aspirin 81 MG tablet Take 81 mg by mouth daily.    Marland Kitchen atorvastatin (LIPITOR) 20 MG tablet Take 1 tablet (20 mg total) by mouth daily. 90 tablet 3  . doxazosin (CARDURA) 2 MG tablet Take 1 tablet (2 mg total) by mouth daily. NEEDS OV FOR NEXT SET OF REFILLS 90 tablet 0  . escitalopram (LEXAPRO) 10 MG tablet TAKE 1 TABLET(10 MG) BY MOUTH DAILY 30 tablet 5  . fluticasone (FLONASE) 50 MCG/ACT nasal spray Place 1 spray into both nostrils daily.    Marland Kitchen FREESTYLE LITE test strip USE AS DIRECTED ONCE DAILY AS DIRECTED 100 each 0  . Lancets (FREESTYLE) lancets USE AS DIRECTED ONCE DAILY 100 each 0  .  senna-docusate (SENOKOT-S) 8.6-50 MG per tablet Take 2 tablets by mouth at bedtime. 60 tablet 0  . Tdap (BOOSTRIX) 5-2.5-18.5 LF-MCG/0.5 injection Inject 0.5 mLs into the muscle once. 0.5 mL 0  . Vitamin D, Ergocalciferol, (DRISDOL) 50000 units CAPS capsule TAKE 1 CAPSULE BY MOUTH ONCE A WEEK 12 capsule 3  . famotidine (PEPCID) 20 MG tablet TAKE 1 TABLET BY MOUTH EVERY DAY 90 tablet 0  . losartan (COZAAR) 25 MG tablet TAKE 1 TABLET(25 MG) BY MOUTH DAILY 30 tablet 5   No facility-administered medications prior to visit.     Review of Systems;  Patient denies headache, fevers, malaise, unintentional weight loss, skin rash, eye pain, sinus congestion and sinus pain, sore throat, dysphagia,  hemoptysis , cough, dyspnea, wheezing, chest pain, palpitations,  orthopnea, edema, abdominal pain, nausea, melena, diarrhea, constipation, flank pain, dysuria, hematuria, urinary  Frequency, nocturia, numbness, tingling, seizures,  Focal weakness, Loss of consciousness,  Tremor, insomnia, depression, anxiety, and suicidal ideation.      Objective:  BP (!) 160/70   Pulse 79   Resp 16   Wt 128 lb (58.1 kg)   SpO2 96%   BMI 25.00 kg/m   BP Readings from Last 3 Encounters:  02/22/16 (!) 160/70  05/31/15 (!) 158/70  12/01/14 138/62    Wt Readings from Last 3 Encounters:  02/22/16 128 lb (58.1 kg)  05/31/15 137 lb 8 oz (62.4 kg)  12/01/14 133 lb 8 oz (60.6 kg)    General appearance: alert, cooperative and appears stated age Ears: normal TM's and external ear canals both ears Throat: lips, mucosa, and tongue normal; teeth and gums normal Neck: no adenopathy, no carotid bruit, supple, symmetrical, trachea midline and thyroid not enlarged, symmetric, no tenderness/mass/nodules Back: symmetric, no curvature. ROM normal. No CVA tenderness. Lungs: clear to auscultation bilaterally Heart: regular rate and rhythm, S1, S2 normal, no murmur, click, rub or gallop Abdomen: soft, non-tender; bowel sounds normal; no masses,  no organomegaly Pulses: 2+ and symmetric Skin: Skin color, texture, turgor normal. No rashes or lesions Lymph nodes: Cervical, supraclavicular, and axillary nodes normal.  Lab Results  Component Value Date   HGBA1C 5.8 02/22/2016   HGBA1C 6.2 06/14/2015   HGBA1C 6.4 05/31/2015    Lab Results  Component Value Date   CREATININE 1.53 (H) 02/22/2016   CREATININE 1.36 (H) 06/14/2015   CREATININE 1.36 (H) 06/14/2015    Lab Results  Component Value Date   WBC 7.1 05/31/2015   HGB 11.6 (L) 05/31/2015   HCT 35.6 (L) 05/31/2015   PLT 232.0 05/31/2015   GLUCOSE 109 (H) 02/22/2016   CHOL 135 02/22/2016   TRIG 104.0 02/22/2016   HDL 43.30 02/22/2016   LDLDIRECT 73.0 02/22/2016   LDLCALC 71 02/22/2016   ALT 8 02/22/2016   AST 12  02/22/2016   NA 141 02/22/2016   K 3.9 02/22/2016   CL 108 02/22/2016   CREATININE 1.53 (H) 02/22/2016   BUN 21 02/22/2016   CO2 24 02/22/2016   HGBA1C 5.8 02/22/2016   MICROALBUR 211.9 (H) 06/14/2015       Assessment & Plan:   Problem List Items Addressed This Visit    Carotid artery stenosis with cerebral infarction ([place])    Stenosis of L ICA had progressed by recent doppler .  Statin was changed from simvastatin to atorvastatin in June  Lab Results  Component Value Date   CHOL 135 02/22/2016   HDL 43.30 02/22/2016   LDLCALC 71 02/22/2016   LDLDIRECT 73.0 02/22/2016  TRIG 104.0 02/22/2016   CHOLHDL 3 02/22/2016   Lab Results  Component Value Date   ALT 8 02/22/2016   AST 12 02/22/2016   ALKPHOS 73 02/22/2016   BILITOT 0.6 02/22/2016         Relevant Medications   losartan (COZAAR) 50 MG tablet   Diabetes mellitus with chronic kidney disease (HCC)   Relevant Medications   losartan (COZAAR) 50 MG tablet   Other Relevant Orders   POCT glycosylated hemoglobin (Hb A1C) (Completed)   Ambulatory referral to [place] metabolic panel (Completed)   HTN (hypertension)    Not well controlled .  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  She has not increased her activity level to a minimum of 15 minutes daily  And has lost strength without continued PT. Sh eis not safe to leave the home without assistance .  Referral for home PT in progress        Other Visit Diagnoses    Vitamin D deficiency    -  Primary   Relevant Orders   Ambulatory referral to [place] D 25 Hydroxy (Vit-D Deficiency, Fractures) (Completed)   Hyperlipidemia associated with type 2 diabetes mellitus (HCC)       Relevant Medications   losartan (COZAAR) 50 MG tablet   Other Relevant Orders   LDL cholesterol, direct  (Completed)   Lipid panel (Completed)      I have changed Ms. Ransier's famotidine and losartan. I am also having her maintain her senna-docusate, aspirin, fluticasone, alendronate, amLODipine, Vitamin D (Ergocalciferol), Tdap, atorvastatin, escitalopram, amitriptyline, freestyle, FREESTYLE LITE, amLODipine, and doxazosin.  Meds ordered this encounter  Medications  . famotidine (PEPCID) 20 MG tablet    Sig: Take 1 tablet (20 mg total) by mouth daily.    Dispense:  90 tablet    Refill:  3  . losartan (COZAAR) 50 MG tablet    Sig: Take 1 tablet (50 mg total) by mouth daily.    Dispense:  90 tablet    Refill:  1    Medications Discontinued During This Encounter  Medication Reason  . famotidine (PEPCID) 20 MG tablet Reorder  . losartan (COZAAR) 25 MG tablet Reorder    Follow-up: Return in about 6 months (around 08/21/2016), or FASTING LABS PRIOR TO VISIT, for follow up diabetes.   Crecencio Mc, MD

## 2016-02-23 NOTE — Assessment & Plan Note (Signed)
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  She has not increased her activity level to a minimum of 15 minutes daily  And has lost strength without continued PT. Sh eis not safe to leave the home without assistance .  Referral for home PT in progress

## 2016-02-23 NOTE — Assessment & Plan Note (Addendum)
Stenosis of L ICA had progressed by recent doppler .  Statin was changed from simvastatin to atorvastatin in June  Lab Results  Component Value Date   CHOL 135 02/22/2016   HDL 43.30 02/22/2016   LDLCALC 71 02/22/2016   LDLDIRECT 73.0 02/22/2016   TRIG 104.0 02/22/2016   CHOLHDL 3 02/22/2016   Lab Results  Component Value Date   ALT 8 02/22/2016   AST 12 02/22/2016   ALKPHOS 73 02/22/2016   BILITOT 0.6 02/22/2016

## 2016-02-23 NOTE — Assessment & Plan Note (Signed)
Not well controlled .  increase losartan to 50 mg daily

## 2016-02-24 ENCOUNTER — Telehealth: Payer: Self-pay | Admitting: *Deleted

## 2016-02-24 DIAGNOSIS — R262 Difficulty in walking, not elsewhere classified: Secondary | ICD-10-CM | POA: Diagnosis not present

## 2016-02-24 DIAGNOSIS — R531 Weakness: Secondary | ICD-10-CM | POA: Diagnosis not present

## 2016-02-24 NOTE — Telephone Encounter (Signed)
[name] from [facility] requested verbal orders fpr physical therapy 1 time a week for 1 week, 2 times a week for 2 weeks and 1 time a week for 2 weeks.  [employer] [phone]

## 2016-02-24 NOTE — Telephone Encounter (Signed)
Verbal order authorization given

## 2016-02-26 ENCOUNTER — Other Ambulatory Visit: Payer: Self-pay | Admitting: Internal Medicine

## 2016-02-26 DIAGNOSIS — N183 Chronic kidney disease, stage 3 unspecified: Secondary | ICD-10-CM

## 2016-02-26 NOTE — Progress Notes (Signed)
Lab Results  Component Value Date   HGBA1C 5.8 02/22/2016

## 2016-02-27 NOTE — Telephone Encounter (Signed)
Orders given as stated below to [facility]

## 2016-02-28 ENCOUNTER — Telehealth: Payer: Self-pay | Admitting: *Deleted

## 2016-02-28 DIAGNOSIS — R262 Difficulty in walking, not elsewhere classified: Secondary | ICD-10-CM | POA: Diagnosis not present

## 2016-02-28 DIAGNOSIS — Z7689 Persons encountering health services in other specified circumstances: Secondary | ICD-10-CM | POA: Diagnosis not present

## 2016-02-28 DIAGNOSIS — R531 Weakness: Secondary | ICD-10-CM | POA: Diagnosis not present

## 2016-02-28 NOTE — Telephone Encounter (Signed)
Candace Monroe from [facility] requested medication refills for Doxazosin and freestyle lite test strips  Pharmacy [facility]

## 2016-02-29 ENCOUNTER — Other Ambulatory Visit: Payer: Self-pay

## 2016-02-29 DIAGNOSIS — R69 Illness, unspecified: Secondary | ICD-10-CM | POA: Diagnosis not present

## 2016-02-29 MED ORDER — DOXAZOSIN MESYLATE 2 MG PO TABS: 2.0000 mg | ORAL_TABLET | Freq: Every day | ORAL | 0 refills | Status: DC

## 2016-02-29 MED ORDER — GLUCOSE BLOOD VI STRP: ORAL_STRIP | 0 refills | Status: DC

## 2016-02-29 NOTE — Telephone Encounter (Signed)
Refill completed.

## 2016-03-01 ENCOUNTER — Other Ambulatory Visit: Payer: Self-pay

## 2016-03-02 ENCOUNTER — Other Ambulatory Visit: Payer: Self-pay | Admitting: Internal Medicine

## 2016-03-02 ENCOUNTER — Other Ambulatory Visit: Payer: Self-pay | Admitting: *Deleted

## 2016-03-02 DIAGNOSIS — F329 Major depressive disorder, single episode, unspecified: Secondary | ICD-10-CM

## 2016-03-02 DIAGNOSIS — F32A Depression, unspecified: Secondary | ICD-10-CM

## 2016-03-02 MED ORDER — ONETOUCH ULTRA SYSTEM W/DEVICE KIT: 1.0000 | PACK | Freq: Once | 0 refills | Status: AC

## 2016-03-02 NOTE — Telephone Encounter (Signed)
Rec rf req for a new Rx for a One Touch meter. The strips that we sent were for Freestyle lite meter. However, a One touch meter was dispensed. New rx for glucometer sent. See meds.

## 2016-03-03 DIAGNOSIS — R531 Weakness: Secondary | ICD-10-CM | POA: Diagnosis not present

## 2016-03-03 DIAGNOSIS — R262 Difficulty in walking, not elsewhere classified: Secondary | ICD-10-CM | POA: Diagnosis not present

## 2016-03-05 ENCOUNTER — Telehealth: Payer: Self-pay | Admitting: *Deleted

## 2016-03-05 DIAGNOSIS — M25571 Pain in right ankle and joints of right foot: Secondary | ICD-10-CM

## 2016-03-05 NOTE — Telephone Encounter (Signed)
Candace Monroe at [facility] advised of below he will contact patient to come in for xrays.

## 2016-03-05 NOTE — Addendum Note (Signed)
Addended by: Crecencio Mc on: 03/05/2016 12:31 PM   Modules accepted: Orders

## 2016-03-05 NOTE — Telephone Encounter (Signed)
Lamuel from [facility] report pt twisting her right ankle on Saturday. The ankle is bruised and swollen, pt refused to go to the Emergency room on Saturday. She requested to wait until her appt with Dr.Tullo on 03/07. Lamuel left her with care advise for the ankle.  Contact Lamuel [phone]

## 2016-03-05 NOTE — Telephone Encounter (Signed)
She needs to have it x rayed , here at the office,.  Films ordered .

## 2016-03-06 ENCOUNTER — Other Ambulatory Visit: Payer: Self-pay | Admitting: Internal Medicine

## 2016-03-06 ENCOUNTER — Ambulatory Visit (INDEPENDENT_AMBULATORY_CARE_PROVIDER_SITE_OTHER): Payer: Medicare HMO

## 2016-03-06 ENCOUNTER — Other Ambulatory Visit (INDEPENDENT_AMBULATORY_CARE_PROVIDER_SITE_OTHER): Payer: Medicare HMO

## 2016-03-06 ENCOUNTER — Encounter: Payer: Self-pay | Admitting: Internal Medicine

## 2016-03-06 ENCOUNTER — Other Ambulatory Visit: Payer: Self-pay

## 2016-03-06 DIAGNOSIS — N183 Chronic kidney disease, stage 3 unspecified: Secondary | ICD-10-CM

## 2016-03-06 DIAGNOSIS — N179 Acute kidney failure, unspecified: Secondary | ICD-10-CM | POA: Insufficient documentation

## 2016-03-06 DIAGNOSIS — S8261XA Displaced fracture of lateral malleolus of right fibula, initial encounter for closed fracture: Secondary | ICD-10-CM | POA: Insufficient documentation

## 2016-03-06 DIAGNOSIS — S82841A Displaced bimalleolar fracture of right lower leg, initial encounter for closed fracture: Secondary | ICD-10-CM | POA: Insufficient documentation

## 2016-03-06 DIAGNOSIS — M25571 Pain in right ankle and joints of right foot: Secondary | ICD-10-CM

## 2016-03-06 DIAGNOSIS — N189 Chronic kidney disease, unspecified: Secondary | ICD-10-CM

## 2016-03-06 DIAGNOSIS — S82831A Other fracture of upper and lower end of right fibula, initial encounter for closed fracture: Secondary | ICD-10-CM | POA: Diagnosis not present

## 2016-03-06 HISTORY — DX: Displaced fracture of lateral malleolus of right fibula, initial encounter for closed fracture: S82.61XA

## 2016-03-06 LAB — BASIC METABOLIC PANEL
BUN: 34 mg/dL — ABNORMAL HIGH (ref 6–23)
CHLORIDE: 105 meq/L (ref 96–112)
CO2: 23 mEq/L (ref 19–32)
CREATININE: 1.94 mg/dL — AB (ref 0.40–1.20)
Calcium: 9.1 mg/dL (ref 8.4–10.5)
GFR: 26.54 mL/min — ABNORMAL LOW (ref >60.00)
Glucose, Bld: 112 mg/dL — ABNORMAL HIGH (ref 70–99)
Potassium: 4.3 mEq/L (ref 3.5–5.1)
Sodium: 139 mEq/L (ref 135–145)

## 2016-03-06 MED ORDER — BLOOD GLUCOSE METER KIT: PACK | 0 refills | Status: AC

## 2016-03-06 MED ORDER — GLUCOSE BLOOD VI STRP: ORAL_STRIP | 12 refills | Status: DC

## 2016-03-06 NOTE — Assessment & Plan Note (Signed)
Stopping alendronate and losartan  Nephrology referral .

## 2016-03-07 ENCOUNTER — Emergency Department
Admission: EM | Admit: 2016-03-07 | Discharge: 2016-03-07 | Disposition: A | Discharge status: Home / Self Care | Payer: Medicare HMO | Attending: Emergency Medicine | Admitting: Emergency Medicine

## 2016-03-07 ENCOUNTER — Other Ambulatory Visit: Payer: Self-pay

## 2016-03-07 DIAGNOSIS — Y939 Activity, unspecified: Secondary | ICD-10-CM | POA: Diagnosis not present

## 2016-03-07 DIAGNOSIS — Z87891 Personal history of nicotine dependence: Secondary | ICD-10-CM | POA: Diagnosis not present

## 2016-03-07 DIAGNOSIS — Y999 Unspecified external cause status: Secondary | ICD-10-CM | POA: Insufficient documentation

## 2016-03-07 DIAGNOSIS — E1122 Type 2 diabetes mellitus with diabetic chronic kidney disease: Secondary | ICD-10-CM | POA: Diagnosis not present

## 2016-03-07 DIAGNOSIS — Y929 Unspecified place or not applicable: Secondary | ICD-10-CM | POA: Insufficient documentation

## 2016-03-07 DIAGNOSIS — N189 Chronic kidney disease, unspecified: Secondary | ICD-10-CM | POA: Insufficient documentation

## 2016-03-07 DIAGNOSIS — S82891A Other fracture of right lower leg, initial encounter for closed fracture: Secondary | ICD-10-CM | POA: Insufficient documentation

## 2016-03-07 DIAGNOSIS — Z7982 Long term (current) use of aspirin: Secondary | ICD-10-CM | POA: Diagnosis not present

## 2016-03-07 DIAGNOSIS — X501XXA Overexertion from prolonged static or awkward postures, initial encounter: Secondary | ICD-10-CM | POA: Diagnosis not present

## 2016-03-07 DIAGNOSIS — I129 Hypertensive chronic kidney disease with stage 1 through stage 4 chronic kidney disease, or unspecified chronic kidney disease: Secondary | ICD-10-CM | POA: Diagnosis not present

## 2016-03-07 DIAGNOSIS — E785 Hyperlipidemia, unspecified: Secondary | ICD-10-CM | POA: Diagnosis not present

## 2016-03-07 DIAGNOSIS — Z79899 Other long term (current) drug therapy: Secondary | ICD-10-CM | POA: Diagnosis not present

## 2016-03-07 DIAGNOSIS — S99911A Unspecified injury of right ankle, initial encounter: Secondary | ICD-10-CM | POA: Diagnosis present

## 2016-03-07 DIAGNOSIS — S8261XA Displaced fracture of lateral malleolus of right fibula, initial encounter for closed fracture: Secondary | ICD-10-CM | POA: Diagnosis not present

## 2016-03-07 MED ORDER — TRAMADOL HCL 50 MG PO TABS: 50.0000 mg | ORAL_TABLET | Freq: Two times a day (BID) | ORAL | 0 refills | Status: DC | PRN

## 2016-03-07 MED ORDER — TRAMADOL HCL 50 MG PO TABS
50.0000 mg | ORAL_TABLET | Freq: Once | ORAL | Status: AC
  Administered 2016-03-07: 50 mg via ORAL
  Filled 2016-03-07: qty 1

## 2016-03-07 NOTE — ED Provider Notes (Signed)
[facility] Emergency Department Provider Note   ____________________________________________   First MD Initiated Contact with Patient 03/07/16 1100     (approximate)  I have reviewed the triage vital signs and the nursing notes.   HISTORY  Chief Complaint Ankle Pain    HPI Candace Monroe is a 76 y.o. female patient complaining of right ankle pain secondary to twisting incident 5 days ago. Patient state she saw her PCP yesterday and x-ray was taken. Patient state they called her after 5 PM and told her to follow with the emergency room stable closing.Patient has been ambulating without support since the incident. Patient rates the pain as a 4/10. Patient described a pain as "achy". No palliative measures taken for this complaint.  Past Medical History:  Diagnosis Date  . Arthritis   . Breast mass   . Depression   . Diabetes mellitus without complication ([place])   . Diverticulitis   . HTN (hypertension)   . Hyperlipidemia   . Stroke ([place]) Sept-Oct 2015  . Urine incontinence     Patient Active Problem List   Diagnosis Date Noted  . Fracture of lateral malleolus of right ankle 03/06/2016  . Acute on chronic renal failure ([place]) 03/06/2016  . Diabetic nephropathy ([place]) 06/03/2015  . Hyperlipidemia LDL goal <100 06/03/2015  . Osteoporosis 12/03/2014  . Carotid artery stenosis with cerebral infarction ([place]) 12/01/2014  . Diabetes mellitus with chronic kidney disease ([place]) 09/29/2014  . Lumbar scoliosis 11/13/2013  . Abnormal mammogram of left breast 11/13/2013  . Gout flare 10/14/2013  . HTN (hypertension) 10/07/2013  . Major depressive disorder, recurrent episode, in partial remission ([place]) 10/07/2013  . Spastic hemiparesis affecting dominant side ([place]) 10/06/2013  . CVA (cerebral infarction) 10/02/2013    Past Surgical History:  Procedure Laterality Date  . ABDOMINAL HYSTERECTOMY  1973  . CHOLECYSTECTOMY  1990  . TONSILLECTOMY      Prior  to Admission medications   Medication Sig Start Date End Date Taking? Authorizing Provider  amitriptyline (ELAVIL) 10 MG tablet TAKE 1 TABLET BY MOUTH AT BEDTIME AS NEEDED FOR INSOMNIA 12/27/15   Crecencio Mc, MD  amLODipine (NORVASC) 10 MG tablet TAKE 1 TABLET BY MOUTH EVERY DAY 02/14/15   Crecencio Mc, MD  amLODipine (NORVASC) 10 MG tablet TAKE 1 TABLET BY MOUTH EVERY DAY 02/07/16   Crecencio Mc, MD  aspirin 81 MG tablet Take 81 mg by mouth daily.    Historical Provider, MD  atorvastatin (LIPITOR) 20 MG tablet Take 1 tablet (20 mg total) by mouth daily. 06/03/15   Crecencio Mc, MD  blood glucose meter kit and supplies Please dispense One Touch meter, E11.22 03/06/16   Crecencio Mc, MD  doxazosin (CARDURA) 2 MG tablet Take 1 tablet (2 mg total) by mouth daily. NEEDS OV FOR NEXT SET OF REFILLS 02/29/16   Crecencio Mc, MD  escitalopram (LEXAPRO) 10 MG tablet TAKE 1 TABLET BY MOUTH EVERY DAY 03/02/16   Crecencio Mc, MD  famotidine (PEPCID) 20 MG tablet Take 1 tablet (20 mg total) by mouth daily. 02/22/16   Crecencio Mc, MD  fluticasone (FLONASE) 50 MCG/ACT nasal spray Place 1 spray into both nostrils daily. 08/26/13   Historical Provider, MD  glucose blood test strip Use as instructed, one touch to check blood glucose up to 3 times a day. E11.22 03/06/16   Crecencio Mc, MD  Lancets (FREESTYLE) lancets USE AS DIRECTED ONCE DAILY 01/26/16   Helene Kelp  Ether Griffins, MD  senna-docusate (SENOKOT-S) 8.6-50 MG per tablet Take 2 tablets by mouth at bedtime. 10/21/13   Bary Leriche, PA-C  Tdap (BOOSTRIX) 5-2.5-18.5 LF-MCG/0.5 injection Inject 0.5 mLs into the muscle once. 05/31/15   Crecencio Mc, MD  traMADol (ULTRAM) 50 MG tablet Take 1 tablet (50 mg total) by mouth every 12 (twelve) hours as needed. 03/07/16   Sable Feil, PA-C  Vitamin D, Ergocalciferol, (DRISDOL) 50000 units CAPS capsule TAKE 1 CAPSULE BY MOUTH ONCE A WEEK 05/16/15   Crecencio Mc, MD    Allergies Ace inhibitors; Augmentin  [amoxicillin-pot clavulanate]; and Sulfa antibiotics  Family History  Problem Relation Age of Onset  . Stroke Mother   . Cancer Sister 58    breast  . Diabetes Brother   . Seizures Sister     Social History Social History  Substance Use Topics  . Smoking status: Former Smoker    Years: 50.00    Quit date: 09/01/2013  . Smokeless tobacco: Never Used  . Alcohol use No    Review of Systems Constitutional: No fever/chills Eyes: No visual changes. ENT: No sore throat. Cardiovascular: Denies chest pain. Respiratory: Denies shortness of breath. Gastrointestinal: No abdominal pain.  No nausea, no vomiting.  No diarrhea.  No constipation. Genitourinary: Negative for dysuria. Musculoskeletal: Negative for back pain. Skin: Negative for rash. Neurological: Negative for headaches, focal weakness or numbness. Endocrine:Hypertension, diabetes, and hyperlipidemia. Hematological/Lymphatic: Allergic/Immunilogical: See medication list ____________________________________________   PHYSICAL EXAM:  VITAL SIGNS: ED Triage Vitals  Enc Vitals Group     BP 03/07/16 0940 (!) 142/96     Pulse Rate 03/07/16 0940 82     Resp 03/07/16 0940 18     Temp 03/07/16 0940 97.9 F (36.6 C)     Temp Source 03/07/16 0940 Oral     SpO2 03/07/16 0940 99 %     Weight 03/07/16 0941 128 lb (58.1 kg)     Height 03/07/16 0941 5' (1.524 m)     Head Circumference --      Peak Flow --      Pain Score 03/07/16 1007 4     Pain Loc --      Pain Edu? --      Excl. in [place]? --     Constitutional: Alert and oriented. Well appearing and in no acute distress. Eyes: Conjunctivae are normal. PERRL. EOMI. Head: Atraumatic. Nose: No congestion/rhinnorhea. Mouth/Throat: Mucous membranes are moist.  Oropharynx non-erythematous. Neck: No stridor.  No cervical spine tenderness to palpation. Hematological/Lymphatic/Immunilogical: No cervical lymphadenopathy. Cardiovascular: Normal rate, regular rhythm. Grossly normal  heart sounds.  Good peripheral circulation. Respiratory: Normal respiratory effort.  No retractions. Lungs CTAB. Gastrointestinal: Soft and nontender. No distention. No abdominal bruits. No CVA tenderness. Musculoskeletal: Mild edema but no deformity to the lateral malleolus. Moderate guarding palpation of the lateral malleolus.. Neurologic:  Normal speech and language. No gross focal neurologic deficits are appreciated. No gait instability. Skin:  Skin is warm, dry and intact. No rash noted. Ecchymosis right foot Psychiatric: Mood and affect are normal. Speech and behavior are normal.  ____________________________________________   LABS (all labs ordered are listed, but only abnormal results are displayed)  Labs Reviewed - No data to display ____________________________________________  EKG   ____________________________________________  RADIOLOGY  Reviewed x-ray showing a nondisplaced distal fibular fracture. ____________________________________________   PROCEDURES  Procedure(s) performed: None  Procedures  Critical Care performed: No  ____________________________________________   INITIAL IMPRESSION / ASSESSMENT AND PLAN / ED  COURSE  Pertinent labs & imaging results that were available during my care of the patient were reviewed by me and considered in my medical decision making (see chart for details).  Right distal fibular fracture. Fractures nondisplaced. Discussed x-ray finding with patient. Patient placed in the ankle stirrup splint and advised to follow-up with orthopedics for continual care. Patient given a prescription for tramadol 50 mg taken twice a day when necessary pain.      ____________________________________________   FINAL CLINICAL IMPRESSION(S) / ED DIAGNOSES  Final diagnoses:  Closed fracture of right ankle, initial encounter      NEW MEDICATIONS STARTED DURING THIS VISIT:  New Prescriptions   TRAMADOL (ULTRAM) 50 MG TABLET    Take  1 tablet (50 mg total) by mouth every 12 (twelve) hours as needed.     Note:  This document was prepared using Dragon voice recognition software and may include unintentional dictation errors.    Sable Feil, PA-C 03/07/16 [place], MD 03/07/16 [phone]

## 2016-03-07 NOTE — Telephone Encounter (Addendum)
Patient spouse called and wanted to know why she needed to go to the ER , I explained due to fractured ankle needs to be evaluated by orthopedics per PCP . Patient husband stated "just another bill I have to pay" I then advised him that a fracture must be treated and he stated he would take her to the ER. FYI

## 2016-03-07 NOTE — Discharge Instructions (Signed)
Wearsplinted until evaluation by orthopedic Dr.

## 2016-03-07 NOTE — ED Triage Notes (Addendum)
Pt states she rolled her right ankle last Thursday and thought it was just a sprained ankle but saw PCP yesterday and xray that shows a Fx. And was not called back until after 5pm yesterday so they came here for tx

## 2016-03-07 NOTE — ED Notes (Signed)
See triage note states she fell last Thursday  Injury to right ankle/foot  Was seen by her PCP had x-rays done   Was told to come to ED for f/u  Min swelling noted   Large amt of bruising noted to foot  Positive pulses

## 2016-03-07 NOTE — Telephone Encounter (Signed)
PT spouse called and stated that someone called yesterday and said that they need to go to the emeregency room. Please advise, thank you!  Call pt @ [phone]

## 2016-03-09 ENCOUNTER — Telehealth: Payer: Self-pay | Admitting: *Deleted

## 2016-03-09 DIAGNOSIS — S8264XA Nondisplaced fracture of lateral malleolus of right fibula, initial encounter for closed fracture: Secondary | ICD-10-CM | POA: Diagnosis not present

## 2016-03-09 NOTE — Telephone Encounter (Signed)
Candace Monroe from [facility] reported a missed home visit, due to patient refusal. He will follow up next week

## 2016-03-12 DIAGNOSIS — R531 Weakness: Secondary | ICD-10-CM | POA: Diagnosis not present

## 2016-03-12 DIAGNOSIS — R262 Difficulty in walking, not elsewhere classified: Secondary | ICD-10-CM | POA: Diagnosis not present

## 2016-03-13 ENCOUNTER — Encounter: Payer: Self-pay | Admitting: Internal Medicine

## 2016-03-13 NOTE — Telephone Encounter (Signed)
error 

## 2016-03-22 ENCOUNTER — Ambulatory Visit (INDEPENDENT_AMBULATORY_CARE_PROVIDER_SITE_OTHER): Payer: Medicare HMO

## 2016-03-22 VITALS — BP 136/70 | HR 60 | Temp 97.1°F | Resp 14 | Ht 60.0 in | Wt 131.0 lb

## 2016-03-22 DIAGNOSIS — S8264XA Nondisplaced fracture of lateral malleolus of right fibula, initial encounter for closed fracture: Secondary | ICD-10-CM | POA: Diagnosis not present

## 2016-03-22 DIAGNOSIS — Z0000 Encounter for general adult medical examination without abnormal findings: Secondary | ICD-10-CM

## 2016-03-22 NOTE — Progress Notes (Signed)
Subjective:   Candace Monroe is a 76 y.o. female who presents for an Initial Medicare Annual Wellness Visit.  Review of Systems    No ROS.  Medicare Wellness Visit.  Cardiac Risk Factors include: advanced age (>47mn, >>76women);diabetes mellitus;hypertension     Objective:    Today's Vitals   03/22/16 1603  BP: 136/70  Pulse: 60  Resp: 14  Temp: 97.1 F (36.2 C)  TempSrc: Oral  SpO2: 100%  Weight: 131 lb (59.4 kg)  Height: 5' (1.524 m)   Body mass index is 25.58 kg/m.   Current Medications (verified) Outpatient Encounter Prescriptions as of 03/22/2016  Medication Sig  . amitriptyline (ELAVIL) 10 MG tablet TAKE 1 TABLET BY MOUTH AT BEDTIME AS NEEDED FOR INSOMNIA  . amLODipine (NORVASC) 10 MG tablet TAKE 1 TABLET BY MOUTH EVERY DAY  . amLODipine (NORVASC) 10 MG tablet TAKE 1 TABLET BY MOUTH EVERY DAY  . aspirin 81 MG tablet Take 81 mg by mouth daily.  .Marland Kitchenatorvastatin (LIPITOR) 20 MG tablet Take 1 tablet (20 mg total) by mouth daily.  . blood glucose meter kit and supplies Please dispense One Touch meter, E11.22  . doxazosin (CARDURA) 2 MG tablet Take 1 tablet (2 mg total) by mouth daily. NEEDS OV FOR NEXT SET OF REFILLS  . escitalopram (LEXAPRO) 10 MG tablet TAKE 1 TABLET BY MOUTH EVERY DAY  . famotidine (PEPCID) 20 MG tablet Take 1 tablet (20 mg total) by mouth daily.  . fluticasone (FLONASE) 50 MCG/ACT nasal spray Place 1 spray into both nostrils daily.  .Marland Kitchenglucose blood test strip Use as instructed, one touch to check blood glucose up to 3 times a day. E11.22  . Lancets (FREESTYLE) lancets USE AS DIRECTED ONCE DAILY  . senna-docusate (SENOKOT-S) 8.6-50 MG per tablet Take 2 tablets by mouth at bedtime.  . Tdap (BOOSTRIX) 5-2.5-18.5 LF-MCG/0.5 injection Inject 0.5 mLs into the muscle once.  . traMADol (ULTRAM) 50 MG tablet Take 1 tablet (50 mg total) by mouth every 12 (twelve) hours as needed.  . Vitamin D, Ergocalciferol, (DRISDOL) 50000 units CAPS capsule TAKE 1 CAPSULE  BY MOUTH ONCE A WEEK   No facility-administered encounter medications on file as of 03/22/2016.     Allergies (verified) Ace inhibitors; Augmentin [amoxicillin-pot clavulanate]; and Sulfa antibiotics   History: Past Medical History:  Diagnosis Date  . Arthritis   . Breast mass   . Depression   . Diabetes mellitus without complication ([place]   . Diverticulitis   . HTN (hypertension)   . Hyperlipidemia   . Stroke ([facility] Sept-Oct 2015  . Urine incontinence    Past Surgical History:  Procedure Laterality Date  . ABDOMINAL HYSTERECTOMY  1973  . CHOLECYSTECTOMY  1990  . TONSILLECTOMY     Family History  Problem Relation Age of Onset  . Stroke Mother   . Cancer Sister 658   breast  . Diabetes Brother   . Seizures Sister    Social History   Occupational History  . Not on file.   Social History Main Topics  . Smoking status: Former Smoker    Years: 50.00    Quit date: 09/01/2013  . Smokeless tobacco: Never Used  . Alcohol use No  . Drug use: No  . Sexual activity: No    Tobacco Counseling Counseling given: Not Answered   Activities of Daily Living In your present state of health, do you have any difficulty performing the following activities: 03/22/2016  Hearing? YDarreld Candace Monroe  Vision? N  Difficulty concentrating or making decisions? N  Walking or climbing stairs? Y  Dressing or bathing? N  Doing errands, shopping? N  Preparing Food and eating ? Y  Using the Toilet? N  In the past six months, have you accidently leaked urine? Y  Do you have problems with loss of bowel control? N  Managing your Medications? N  Managing your Finances? N  Housekeeping or managing your Housekeeping? Y  Some recent data might be hidden    Immunizations and Health Maintenance Immunization History  Administered Date(s) Administered  . Influenza, High Dose Seasonal PF 09/29/2014  . Influenza-Unspecified 10/01/2013, 10/12/2015  . Pneumococcal Polysaccharide-23 05/31/2015   Health  Maintenance Due  Topic Date Due  . TETANUS/TDAP  04/15/1957  . FOOT EXAM  12/01/2015    Patient Care Team: Crecencio Mc, MD as PCP - General (Internal Medicine) Birdie Sons, MD as Referring Physician (Family Medicine) Robert Bellow, MD (General Surgery)  Indicate any recent Medical Services you may have received from other than [facility] providers in the past year (date may be approximate).     Assessment:   This is a routine wellness examination for Candace Monroe.  The goal of the wellness visit is to assist the patient how to close the gaps in care and create a preventative care plan for the patient.   Taking calcium VIT D as appropriate/Osteoporosis reviewed.  Medications reviewed; taking without issues or barriers.  Safety issues reviewed; smoke detectors in the home. Firearms locked up in the home. Wears seatbelts when driving or riding with others. Patient does wear sunscreen or protective clothing when in direct sunlight. No violence in the home.  Patient is alert, normal appearance, oriented to person/place/and time. Correctly identified the president of the [place], recall of 3/3 words, and performing simple calculations.  Patient displays appropriate judgement and can read correct time from watch face.  No new identified risk were noted.  No failures at ADL's or IADL's.   BMI- discussed the importance of a healthy diet, water intake and exercise. Educational material provided.   Daily fluid intake- 3 cups caffeine, 1 cup of water.  HTN- followed by PCP.  Dental- Wears dentures.  Sleep patterns- Sleeps 8-9 hours at night.  Wakes feeling rested.  TDAP vaccine deferred per patient preference.  Follow up with insurance.  Educational material provided.  Patient Concerns: None at this time. Follow up with PCP as needed.  Hearing/Vision screen Hearing Screening Comments: Patient has difficulty conversational tones. Audiology testing deferred per patient  reference.   Vision Screening Comments: Hx of detached retina, L eye Wears corrective lenses Last OV 2017 Cataract extraction, bilateral Visual acuity not assessed per patient preference since they have regular follow up with the ophthalmologist  Dietary issues and exercise activities discussed: Current Exercise Habits: Home exercise routine (Chair exercises), Time (Minutes): 10, Frequency (Times/Week): 5, Weekly Exercise (Minutes/Week): 50, Intensity: Mild  Goals    . Increase water intake          Stay hydrated      Depression Screen PHQ 2/9 Scores 03/22/2016 12/01/2014 09/29/2014  PHQ - 2 Score 0 0 0    Fall Risk Fall Risk  03/22/2016 12/01/2014 09/29/2014  Falls in the past year? No Yes Yes  Number falls in past yr: - 1 1  Injury with Fall? - No No  Risk for fall due to : - Impaired mobility Other (Comment);Impaired mobility  Risk for fall due to (comments): - -  Tripped over walker  Follow up - Falls prevention discussed Falls prevention discussed    Cognitive Function:     6CIT Screen 03/22/2016  What Year? 0 points  What month? 0 points  What time? 0 points  Count back from 20 0 points  Months in reverse 0 points  Repeat phrase 0 points  Total Score 0    Screening Tests Health Maintenance  Topic Date Due  . TETANUS/TDAP  04/15/1957  . FOOT EXAM  12/01/2015  . PNA vac Low Risk Adult (2 of 2 - PCV13) 05/30/2016  . URINE MICROALBUMIN  06/13/2016  . HEMOGLOBIN A1C  08/21/2016  . OPHTHALMOLOGY EXAM  12/13/2016  . INFLUENZA VACCINE  Completed  . DEXA SCAN  Completed      Plan:    End of life planning; Advanced aging; Advanced directives discussed.  No HCPOA/Living Will.  Additional information declined at this time.  Medicare Attestation I have personally reviewed: The patient's medical and social history Their use of alcohol, tobacco or illicit drugs Their current medications and supplements The patient's functional ability including ADLs,fall risks,  home safety risks, cognitive, and hearing and visual impairment Diet and physical activities Evidence for depression   The patient's weight, height, BMI, and visual acuity have been recorded in the chart.  I have made referrals and provided education to the patient based on review of the above and I have provided the patient with a written personalized care plan for preventive services.    During the course of the visit, Vernadette was educated and counseled about the following appropriate screening and preventive services:   Vaccines to include Pneumoccal, Influenza, Hepatitis B, Td, Zostavax, HCV  Colorectal cancer screening-aged out/UTD  Bone density screening-UTD  Diabetes-followed by PCP  Glaucoma-annual eye exam  Mammography-aged out/UTD  Nutrition counseling  Patient Instructions (the written plan) were given to the patient.    Varney Biles, LPN   [id number]/[id number]

## 2016-03-22 NOTE — Patient Instructions (Addendum)
  Candace Monroe , Thank you for taking time to come for your Medicare Wellness Visit. I appreciate your ongoing commitment to your health goals. Please review the following plan we discussed and let me know if I can assist you in the future.   Follow up with Dr. Derrel Nip as needed.    Have a great day!  These are the goals we discussed: Goals    . Increase water intake          Stay hydrated       This is a list of the screening recommended for you and due dates:  Health Maintenance  Topic Date Due  . Tetanus Vaccine  04/15/1957  . Complete foot exam   12/01/2015  . Pneumonia vaccines (2 of 2 - PCV13) 05/30/2016  . Urine Protein Check  06/13/2016  . Hemoglobin A1C  08/21/2016  . Eye exam for diabetics  12/13/2016  . Flu Shot  Completed  . DEXA scan (bone density measurement)  Completed

## 2016-03-23 ENCOUNTER — Telehealth: Payer: Self-pay | Admitting: Internal Medicine

## 2016-03-23 NOTE — Telephone Encounter (Signed)
FYI, thanks.

## 2016-03-23 NOTE — Telephone Encounter (Signed)
Candace Monroe [phone] called from [facility] health regarding pt refused care today due to feeling worn out with seeing her Ortho doctor yesterday she still needs to keep wearing boot for three more weeks. It is his last week this week for discharge pt will probably be placed on hold until it's some changes to her weight baring. Will place order and will resume when she's able to put more weight on it per her ortho doctor. Thank you!

## 2016-03-27 NOTE — Progress Notes (Signed)
  I have reviewed the above information and agree with above.   Teresa Tullo, MD 

## 2016-04-02 ENCOUNTER — Other Ambulatory Visit: Payer: Self-pay | Admitting: Internal Medicine

## 2016-04-04 ENCOUNTER — Other Ambulatory Visit: Payer: Self-pay | Admitting: Internal Medicine

## 2016-04-04 DIAGNOSIS — F329 Major depressive disorder, single episode, unspecified: Secondary | ICD-10-CM

## 2016-04-04 DIAGNOSIS — F32A Depression, unspecified: Secondary | ICD-10-CM

## 2016-04-04 NOTE — Telephone Encounter (Signed)
refilled 

## 2016-04-04 NOTE — Telephone Encounter (Signed)
Lexapro was last refilled on 03/02/16. Ok to refill?

## 2016-04-12 DIAGNOSIS — S8261XD Displaced fracture of lateral malleolus of right fibula, subsequent encounter for closed fracture with routine healing: Secondary | ICD-10-CM | POA: Diagnosis not present

## 2016-04-13 ENCOUNTER — Telehealth: Payer: Self-pay | Admitting: Internal Medicine

## 2016-04-13 DIAGNOSIS — N179 Acute kidney failure, unspecified: Secondary | ICD-10-CM | POA: Diagnosis not present

## 2016-04-13 DIAGNOSIS — N183 Chronic kidney disease, stage 3 (moderate): Secondary | ICD-10-CM | POA: Diagnosis not present

## 2016-04-13 DIAGNOSIS — I10 Essential (primary) hypertension: Secondary | ICD-10-CM | POA: Diagnosis not present

## 2016-04-13 NOTE — Telephone Encounter (Signed)
I am reluctant to do this because because I did not treat her ankle fracture. Why aren't  they calling her orthopedist  for this?

## 2016-04-13 NOTE — Telephone Encounter (Signed)
Is it ok to give permission?

## 2016-04-13 NOTE — Telephone Encounter (Signed)
[name] [phone] called from [facility] home health regarding pt is allowed now to put weight on her right leg. Pt was placed on hold. Pt can now have therapy again. He needs permission to see pt on Monday for reeval.   VM can be left. Thank you!

## 2016-04-16 ENCOUNTER — Telehealth: Payer: Self-pay | Admitting: Internal Medicine

## 2016-04-16 NOTE — Telephone Encounter (Signed)
[name] [phone] called from [facility] to follow up on the therapy for pt. Thank you!

## 2016-04-16 NOTE — Telephone Encounter (Signed)
Candace Monroe advised of below and verbalized of below.

## 2016-04-17 DIAGNOSIS — N183 Chronic kidney disease, stage 3 (moderate): Secondary | ICD-10-CM | POA: Diagnosis not present

## 2016-04-18 DIAGNOSIS — R809 Proteinuria, unspecified: Secondary | ICD-10-CM | POA: Diagnosis not present

## 2016-04-19 ENCOUNTER — Telehealth: Payer: Self-pay | Admitting: Internal Medicine

## 2016-04-19 NOTE — Telephone Encounter (Signed)
Fine I will sign

## 2016-04-19 NOTE — Telephone Encounter (Signed)
Spoke with Skeet Simmer, thanks

## 2016-04-19 NOTE — Telephone Encounter (Signed)
Skeet Simmer from [place] called and stated that Dr. Audrie Gallus gave approval to approve therapy. He will need re certification, and needs verification and that Dr. Derrel Nip would be signing orders. Please advise, thank you!  Call pt @ [phone]

## 2016-04-19 NOTE — Telephone Encounter (Signed)
Based on Prior note they told the [facility] that this should come from the Orthopedist with Dr. Rachell Cipro gave approval, now they are wanting you to sign orders, please advise. Thanks???

## 2016-04-20 ENCOUNTER — Telehealth: Payer: Self-pay | Admitting: *Deleted

## 2016-04-20 DIAGNOSIS — R531 Weakness: Secondary | ICD-10-CM | POA: Diagnosis not present

## 2016-04-20 DIAGNOSIS — R262 Difficulty in walking, not elsewhere classified: Secondary | ICD-10-CM | POA: Diagnosis not present

## 2016-04-20 NOTE — Telephone Encounter (Signed)
Spoke with Lamuel, gave orders, thanks

## 2016-04-20 NOTE — Telephone Encounter (Signed)
Lamuel from [facility] has requested verbal orders for physical therapy 2 times a week for 2 weeks and 1 time a week for 3 weeks  Contact [phone]

## 2016-04-24 DIAGNOSIS — R262 Difficulty in walking, not elsewhere classified: Secondary | ICD-10-CM | POA: Diagnosis not present

## 2016-04-24 DIAGNOSIS — R531 Weakness: Secondary | ICD-10-CM | POA: Diagnosis not present

## 2016-04-27 DIAGNOSIS — R531 Weakness: Secondary | ICD-10-CM | POA: Diagnosis not present

## 2016-04-27 DIAGNOSIS — R262 Difficulty in walking, not elsewhere classified: Secondary | ICD-10-CM | POA: Diagnosis not present

## 2016-04-30 DIAGNOSIS — S8261XD Displaced fracture of lateral malleolus of right fibula, subsequent encounter for closed fracture with routine healing: Secondary | ICD-10-CM | POA: Diagnosis not present

## 2016-05-01 ENCOUNTER — Telehealth: Payer: Self-pay | Admitting: Internal Medicine

## 2016-05-01 DIAGNOSIS — R531 Weakness: Secondary | ICD-10-CM | POA: Diagnosis not present

## 2016-05-01 DIAGNOSIS — R262 Difficulty in walking, not elsewhere classified: Secondary | ICD-10-CM | POA: Diagnosis not present

## 2016-05-01 MED ORDER — TRAMADOL HCL 50 MG PO TABS
50.0000 mg | ORAL_TABLET | Freq: Four times a day (QID) | ORAL | 0 refills | Status: DC | PRN
Start: 2016-05-01 — End: 2016-08-20

## 2016-05-01 NOTE — Telephone Encounter (Signed)
Patients husband advised of below and verbalized understanding.

## 2016-05-01 NOTE — Telephone Encounter (Signed)
Left voice mail to call back 

## 2016-05-01 NOTE — Telephone Encounter (Signed)
[name] from [place] called and stated that the patient reports of increased generalized pain since last night. Her pain is about a 5/6, she is c/o not being able to move much because of the pain. She is currently not taking anything to help the pain. Can she use Excedrin Extra Strength? Please advise, thank you!  Call [facility] @ [phone] (may leave msg)  Call pt @ [phone].

## 2016-05-01 NOTE — Telephone Encounter (Signed)
Pt called back returning your call. Thank you!  Call pt @ [phone]

## 2016-05-01 NOTE — Telephone Encounter (Signed)
[name] from [place] called and stated that the patient reports of increased generalized pain since last night. Her pain is about a 5/6, she is c/o not being able to move much because of the pain. She is currently not taking anything to help the pain. Can she use Excedrin Extra Strength? Patient only walked 16 feet.  Fasting blood sugar was 127.   She has been released from ortho for ankle fracture.  Patient is not wanted to do therapy.  Please advise, thank you!  Call [facility] @ [phone] (may leave msg)  Call pt @ [phone].

## 2016-05-01 NOTE — Telephone Encounter (Signed)
Yes she can use excedrin ES.  If not strong enough we can refill tramadol  For #30 tablets .  rx printed

## 2016-05-02 NOTE — Telephone Encounter (Signed)
Script faxed to [facility] .

## 2016-05-03 ENCOUNTER — Other Ambulatory Visit: Payer: Self-pay

## 2016-05-03 MED ORDER — AMITRIPTYLINE HCL 10 MG PO TABS: ORAL_TABLET | ORAL | 0 refills | Status: DC

## 2016-05-03 MED ORDER — ESCITALOPRAM OXALATE 10 MG PO TABS: 10.0000 mg | ORAL_TABLET | Freq: Every day | ORAL | 0 refills | Status: DC

## 2016-05-04 ENCOUNTER — Telehealth: Payer: Self-pay | Admitting: Internal Medicine

## 2016-05-04 DIAGNOSIS — R531 Weakness: Secondary | ICD-10-CM | POA: Diagnosis not present

## 2016-05-04 DIAGNOSIS — R262 Difficulty in walking, not elsewhere classified: Secondary | ICD-10-CM | POA: Diagnosis not present

## 2016-05-04 NOTE — Telephone Encounter (Signed)
Skeet Simmer from [facility] called and stated that pt reported of going for a walk this morning and her knees gave out. Pt fell and has bruising on her left hand and right elbow. Pt scooted over to her phone for help. Skeet Simmer reports no bruising on knees. Pt refused to have walk/gait evaluated, and refused advice to go to ED. Grandson did report that the pt's walk/gait has not changed since fall. Please advise, thank you!  Call [facility] @ [phone]

## 2016-05-04 NOTE — Telephone Encounter (Signed)
Attempted to call both the home health nurse and the pt herself to advise the pt that since she doesn't want to go to the ED that if she has any pain or swelling then she will need to be evaluated.

## 2016-05-06 ENCOUNTER — Other Ambulatory Visit: Payer: Self-pay | Admitting: Internal Medicine

## 2016-05-08 DIAGNOSIS — R531 Weakness: Secondary | ICD-10-CM | POA: Diagnosis not present

## 2016-05-08 DIAGNOSIS — R262 Difficulty in walking, not elsewhere classified: Secondary | ICD-10-CM | POA: Diagnosis not present

## 2016-05-16 DIAGNOSIS — R262 Difficulty in walking, not elsewhere classified: Secondary | ICD-10-CM | POA: Diagnosis not present

## 2016-05-16 DIAGNOSIS — R531 Weakness: Secondary | ICD-10-CM | POA: Diagnosis not present

## 2016-05-21 DIAGNOSIS — I10 Essential (primary) hypertension: Secondary | ICD-10-CM | POA: Diagnosis not present

## 2016-05-21 DIAGNOSIS — N183 Chronic kidney disease, stage 3 (moderate): Secondary | ICD-10-CM | POA: Diagnosis not present

## 2016-05-21 DIAGNOSIS — N179 Acute kidney failure, unspecified: Secondary | ICD-10-CM | POA: Diagnosis not present

## 2016-05-21 DIAGNOSIS — N2581 Secondary hyperparathyroidism of renal origin: Secondary | ICD-10-CM | POA: Diagnosis not present

## 2016-05-22 DIAGNOSIS — N179 Acute kidney failure, unspecified: Secondary | ICD-10-CM | POA: Diagnosis not present

## 2016-05-22 DIAGNOSIS — N183 Chronic kidney disease, stage 3 (moderate): Secondary | ICD-10-CM | POA: Diagnosis not present

## 2016-05-22 DIAGNOSIS — I10 Essential (primary) hypertension: Secondary | ICD-10-CM | POA: Diagnosis not present

## 2016-05-22 DIAGNOSIS — N2581 Secondary hyperparathyroidism of renal origin: Secondary | ICD-10-CM | POA: Diagnosis not present

## 2016-05-23 DIAGNOSIS — R531 Weakness: Secondary | ICD-10-CM | POA: Diagnosis not present

## 2016-05-23 DIAGNOSIS — R262 Difficulty in walking, not elsewhere classified: Secondary | ICD-10-CM | POA: Diagnosis not present

## 2016-05-28 ENCOUNTER — Other Ambulatory Visit: Payer: Self-pay | Admitting: Internal Medicine

## 2016-06-09 ENCOUNTER — Other Ambulatory Visit: Payer: Self-pay | Admitting: Internal Medicine

## 2016-06-13 DIAGNOSIS — H43823 Vitreomacular adhesion, bilateral: Secondary | ICD-10-CM | POA: Diagnosis not present

## 2016-06-13 DIAGNOSIS — H35423 Microcystoid degeneration of retina, bilateral: Secondary | ICD-10-CM | POA: Diagnosis not present

## 2016-06-13 DIAGNOSIS — E113293 Type 2 diabetes mellitus with mild nonproliferative diabetic retinopathy without macular edema, bilateral: Secondary | ICD-10-CM | POA: Diagnosis not present

## 2016-06-14 ENCOUNTER — Other Ambulatory Visit: Payer: Self-pay | Admitting: Internal Medicine

## 2016-06-20 ENCOUNTER — Encounter: Payer: Self-pay | Admitting: Internal Medicine

## 2016-06-20 ENCOUNTER — Ambulatory Visit (INDEPENDENT_AMBULATORY_CARE_PROVIDER_SITE_OTHER): Payer: Medicare HMO | Admitting: Internal Medicine

## 2016-06-20 VITALS — BP 140/58 | HR 64 | Temp 97.7°F | Resp 16 | Ht 60.0 in | Wt 128.8 lb

## 2016-06-20 DIAGNOSIS — F5105 Insomnia due to other mental disorder: Secondary | ICD-10-CM

## 2016-06-20 DIAGNOSIS — F4321 Adjustment disorder with depressed mood: Secondary | ICD-10-CM

## 2016-06-20 DIAGNOSIS — N182 Chronic kidney disease, stage 2 (mild): Secondary | ICD-10-CM

## 2016-06-20 DIAGNOSIS — I10 Essential (primary) hypertension: Secondary | ICD-10-CM | POA: Diagnosis not present

## 2016-06-20 DIAGNOSIS — E1122 Type 2 diabetes mellitus with diabetic chronic kidney disease: Secondary | ICD-10-CM

## 2016-06-20 DIAGNOSIS — S8264XS Nondisplaced fracture of lateral malleolus of right fibula, sequela: Secondary | ICD-10-CM | POA: Diagnosis not present

## 2016-06-20 DIAGNOSIS — R69 Illness, unspecified: Secondary | ICD-10-CM | POA: Diagnosis not present

## 2016-06-20 LAB — POCT GLYCOSYLATED HEMOGLOBIN (HGB A1C): HEMOGLOBIN A1C: 5.9

## 2016-06-20 MED ORDER — AMITRIPTYLINE HCL 10 MG PO TABS: ORAL_TABLET | ORAL | 1 refills | Status: DC

## 2016-06-20 NOTE — Patient Instructions (Addendum)
Your kidney function has improved based on the labs the kidney doctro did in April.   continue amlodipine for blood pressure   [facility] so you can take it for insomnia   I'll see you in 6  months

## 2016-06-20 NOTE — Progress Notes (Signed)
Subjective:  Patient ID: Candace Monroe, female    DOB: 20-Mar-1940  Age: 76 y.o. MRN: [id number]  CC: The primary encounter diagnosis was Type 2 diabetes mellitus with stage 2 chronic kidney disease, without long-term current use of insulin ([place]). Diagnoses of Closed nondisplaced fracture of lateral malleolus of right fibula, sequela, Essential hypertension, and Insomnia secondary to situational depression were also pertinent to this visit.  HPI Candace Monroe presents for follow up on chronic conditions  Cc: fatigue due to insomnia occurs about 5 -6 times per week .  Has used amitriptyline in the past with good results   Sustained a Right ankle fracture  Occurred in March  .  Closed, nondisplaced,  wore a boot for 6 weeks .  No current pain .   Has had follow up with [facility]. Denies pain .  No subsequent falls i   Referred to nephrology for CKD acute on chronic with  Losartan and alendronate stopped and amlodipine started  Additional labs April 19 showed cr had improved fro m1.9 to 1.6  And screen for MM etc negative  DM:  Sees a retina specialist.  Sugars have ranged from  90 to 115 fasting.  Does not check post prandials.   Lab Results  Component Value Date   HGBA1C 5.9 06/20/2016    Outpatient Medications Prior to Visit  Medication Sig Dispense Refill  . amLODipine (NORVASC) 10 MG tablet TAKE 1 TABLET BY MOUTH EVERY DAY 90 tablet 3  . aspirin 81 MG tablet Take 81 mg by mouth daily.    Marland Kitchen atorvastatin (LIPITOR) 20 MG tablet TAKE 1 TABLET BY MOUTH EVERY DAY 90 tablet 0  . blood glucose meter kit and supplies Please dispense One Touch meter, E11.22 1 each 0  . doxazosin (CARDURA) 2 MG tablet TAKE 1 TABLET (2 MG TOTAL) BY MOUTH DAILY. NEEDS OV FOR NEXT SET OF REFILLS 90 tablet 0  . famotidine (PEPCID) 20 MG tablet Take 1 tablet (20 mg total) by mouth daily. 90 tablet 3  . fluticasone (FLONASE) 50 MCG/ACT nasal spray Place 1 spray into both nostrils daily.    Marland Kitchen glucose blood test strip  Use as instructed, one touch to check blood glucose up to 3 times a day. E11.22 100 each 12  . Lancets (FREESTYLE) lancets USE AS DIRECTED ONCE DAILY 100 each 0  . senna-docusate (SENOKOT-S) 8.6-50 MG per tablet Take 2 tablets by mouth at bedtime. 60 tablet 0  . traMADol (ULTRAM) 50 MG tablet Take 1 tablet (50 mg total) by mouth every 6 (six) hours as needed for moderate pain. 30 tablet 0  . Vitamin D, Ergocalciferol, (DRISDOL) 50000 units CAPS capsule TAKE 1 CAPSULE BY MOUTH ONCE A WEEK 12 capsule 3  . amitriptyline (ELAVIL) 10 MG tablet TAKE 1 TABLET BY MOUTH AT BEDTIME AS NEEDED FOR INSOMNIA (Patient not taking: Reported on 06/20/2016) 90 tablet 0  . amLODipine (NORVASC) 10 MG tablet TAKE 1 TABLET BY MOUTH EVERY DAY (Patient not taking: Reported on 06/20/2016) 90 tablet 0  . amLODipine (NORVASC) 10 MG tablet TAKE 1 TABLET BY MOUTH EVERY DAY (Patient not taking: Reported on 06/20/2016) 90 tablet 0  . escitalopram (LEXAPRO) 10 MG tablet Take 1 tablet (10 mg total) by mouth daily. (Patient not taking: Reported on 06/20/2016) 90 tablet 0  . Tdap (BOOSTRIX) 5-2.5-18.5 LF-MCG/0.5 injection Inject 0.5 mLs into the muscle once. (Patient not taking: Reported on 06/20/2016) 0.5 mL 0   No facility-administered medications prior to visit.  Review of Systems;  Patient denies headache, fevers, malaise, unintentional weight loss, skin rash, eye pain, sinus congestion and sinus pain, sore throat, dysphagia,  hemoptysis , cough, dyspnea, wheezing, chest pain, palpitations, orthopnea, edema, abdominal pain, nausea, melena, diarrhea, constipation, flank pain, dysuria, hematuria, urinary  Frequency, nocturia, numbness, tingling, seizures,  Focal weakness, Loss of consciousness,  Tremor, insomnia, depression, anxiety, and suicidal ideation.      Objective:  BP (!) 140/58 (BP Location: Left Arm, Patient Position: Sitting, Cuff Size: Normal)   Pulse 64   Temp 97.7 F (36.5 C) (Oral)   Resp 16   Ht 5' (1.524 m)    Wt 128 lb 12.8 oz (58.4 kg)   SpO2 97%   BMI 25.15 kg/m   BP Readings from Last 3 Encounters:  06/20/16 (!) 140/58  03/22/16 136/70  03/07/16 (!) 142/96    Wt Readings from Last 3 Encounters:  06/20/16 128 lb 12.8 oz (58.4 kg)  03/22/16 131 lb (59.4 kg)  03/07/16 128 lb (58.1 kg)    General appearance: alert, cooperative and appears stated age Ears: normal TM's and external ear canals both ears Throat: lips, mucosa, and tongue normal; teeth and gums normal Neck: no adenopathy, no carotid bruit, supple, symmetrical, trachea midline and thyroid not enlarged, symmetric, no tenderness/mass/nodules Back: symmetric, no curvature. ROM normal. No CVA tenderness. Lungs: clear to auscultation bilaterally Heart: regular rate and rhythm, S1, S2 normal, no murmur, click, rub or gallop Abdomen: soft, non-tender; bowel sounds normal; no masses,  no organomegaly Pulses: 2+ and symmetric Skin: Skin color, texture, turgor normal. No rashes or lesions Lymph nodes: Cervical, supraclavicular, and axillary nodes normal.  Lab Results  Component Value Date   HGBA1C 5.9 06/20/2016   HGBA1C 5.8 02/22/2016   HGBA1C 6.2 06/14/2015    Lab Results  Component Value Date   CREATININE 1.94 (H) 03/06/2016   CREATININE 1.53 (H) 02/22/2016   CREATININE 1.36 (H) 06/14/2015   CREATININE 1.36 (H) 06/14/2015    Lab Results  Component Value Date   WBC 7.1 05/31/2015   HGB 11.6 (L) 05/31/2015   HCT 35.6 (L) 05/31/2015   PLT 232.0 05/31/2015   GLUCOSE 112 (H) 03/06/2016   CHOL 135 02/22/2016   TRIG 104.0 02/22/2016   HDL 43.30 02/22/2016   LDLDIRECT 73.0 02/22/2016   LDLCALC 71 02/22/2016   ALT 8 02/22/2016   AST 12 02/22/2016   NA 139 03/06/2016   K 4.3 03/06/2016   CL 105 03/06/2016   CREATININE 1.94 (H) 03/06/2016   BUN 34 (H) 03/06/2016   CO2 23 03/06/2016   HGBA1C 5.9 06/20/2016   MICROALBUR 211.9 (H) 06/14/2015    No results found.  Assessment & Plan:   Problem List Items  Addressed This Visit    Insomnia secondary to situational depression    Resuming amitriptyline per patient request for insomnia occurring 4-5 times per week       Relevant Medications   amitriptyline (ELAVIL) 10 MG tablet   HTN (hypertension)    Now controlled with amlodipine . Losartan dc'd due to elevated cr       Fracture of lateral malleolus of right ankle    Healing well per Orthopedics . Out of boot,  Using walker.  No pain       Diabetes mellitus with chronic kidney disease ([place]) - Primary    well-controlled on current medications.  hemoglobin A1c has been consistently at or  less than 7.0 . Patient is up-to-date on eye exams and  foot exam is normal today. Patient is followed by nephrology for nephropathy ane CKD.  Patient is tolerating statin therapy for CAD risk reduction , but is not on ACE/ARB  Because of prior intolerance.    Lab Results  Component Value Date   HGBA1C 5.9 06/20/2016   Lab Results  Component Value Date   CHOL 135 02/22/2016   HDL 43.30 02/22/2016   LDLCALC 71 02/22/2016   LDLDIRECT 73.0 02/22/2016   TRIG 104.0 02/22/2016   CHOLHDL 3 02/22/2016   Lab Results  Component Value Date   MICROALBUR 211.9 (H) 06/14/2015           Relevant Orders   POCT HgB A1C (Completed)     A total of 25 minutes of face to face time was spent with patient more than half of which was spent in counselling about the above mentioned conditions  and coordination of care  I have discontinued Ms. Kotowski's Tdap and escitalopram. I am also having her maintain her senna-docusate, aspirin, fluticasone, amLODipine, Vitamin D (Ergocalciferol), freestyle, famotidine, glucose blood, blood glucose meter kit and supplies, traMADol, doxazosin, atorvastatin, and amitriptyline.  Meds ordered this encounter  Medications  . amitriptyline (ELAVIL) 10 MG tablet    Sig: TAKE 1 TABLET BY MOUTH AT BEDTIME AS NEEDED FOR INSOMNIA    Dispense:  90 tablet    Refill:  1    Medications  Discontinued During This Encounter  Medication Reason  . amLODipine (NORVASC) 10 MG tablet Duplicate  . amLODipine (NORVASC) 10 MG tablet Duplicate  . escitalopram (LEXAPRO) 10 MG tablet Patient has not taken in last 30 days  . Tdap (BOOSTRIX) 5-2.5-18.5 LF-MCG/0.5 injection Patient has not taken in last 30 days  . amitriptyline (ELAVIL) 10 MG tablet Reorder    Follow-up: Return in about 6 months (around 12/20/2016) for follow up diabetes.   Crecencio Mc, MD

## 2016-06-23 DIAGNOSIS — F5105 Insomnia due to other mental disorder: Secondary | ICD-10-CM

## 2016-06-23 DIAGNOSIS — F4321 Adjustment disorder with depressed mood: Secondary | ICD-10-CM | POA: Insufficient documentation

## 2016-06-23 NOTE — Assessment & Plan Note (Signed)
well-controlled on current medications.  hemoglobin A1c has been consistently at or  less than 7.0 . Patient is up-to-date on eye exams and foot exam is normal today. Patient is followed by nephrology for nephropathy ane CKD.  Patient is tolerating statin therapy for CAD risk reduction , but is not on ACE/ARB  Because of prior intolerance.    Lab Results  Component Value Date   HGBA1C 5.9 06/20/2016   Lab Results  Component Value Date   CHOL 135 02/22/2016   HDL 43.30 02/22/2016   LDLCALC 71 02/22/2016   LDLDIRECT 73.0 02/22/2016   TRIG 104.0 02/22/2016   CHOLHDL 3 02/22/2016   Lab Results  Component Value Date   MICROALBUR 211.9 (H) 06/14/2015

## 2016-06-23 NOTE — Assessment & Plan Note (Signed)
Healing well per Orthopedics . Out of boot,  Using walker.  No pain

## 2016-06-23 NOTE — Assessment & Plan Note (Signed)
Resuming amitriptyline per patient request for insomnia occurring 4-5 times per week

## 2016-06-23 NOTE — Assessment & Plan Note (Signed)
Now controlled with amlodipine . Losartan dc'd due to elevated cr

## 2016-07-19 DIAGNOSIS — R69 Illness, unspecified: Secondary | ICD-10-CM | POA: Diagnosis not present

## 2016-08-04 ENCOUNTER — Other Ambulatory Visit: Payer: Self-pay | Admitting: Internal Medicine

## 2016-08-15 ENCOUNTER — Other Ambulatory Visit: Payer: Self-pay | Admitting: Internal Medicine

## 2016-08-20 ENCOUNTER — Other Ambulatory Visit: Payer: Self-pay | Admitting: Internal Medicine

## 2016-08-20 NOTE — Telephone Encounter (Signed)
Refill authorized.

## 2016-08-20 NOTE — Telephone Encounter (Signed)
Last OV 06/20/2016 Next OV 12/20/2016 Last refill 05/01/2016

## 2016-08-21 NOTE — Telephone Encounter (Signed)
Printed, signed and faxed.  

## 2016-08-24 ENCOUNTER — Other Ambulatory Visit: Payer: Self-pay | Admitting: Internal Medicine

## 2016-09-07 ENCOUNTER — Other Ambulatory Visit: Payer: Self-pay | Admitting: Internal Medicine

## 2016-09-10 DIAGNOSIS — N2581 Secondary hyperparathyroidism of renal origin: Secondary | ICD-10-CM | POA: Diagnosis not present

## 2016-09-10 DIAGNOSIS — I10 Essential (primary) hypertension: Secondary | ICD-10-CM | POA: Diagnosis not present

## 2016-09-10 DIAGNOSIS — N183 Chronic kidney disease, stage 3 (moderate): Secondary | ICD-10-CM | POA: Diagnosis not present

## 2016-09-10 DIAGNOSIS — R809 Proteinuria, unspecified: Secondary | ICD-10-CM | POA: Diagnosis not present

## 2016-09-11 DIAGNOSIS — N183 Chronic kidney disease, stage 3 (moderate): Secondary | ICD-10-CM | POA: Diagnosis not present

## 2016-09-11 DIAGNOSIS — R809 Proteinuria, unspecified: Secondary | ICD-10-CM | POA: Diagnosis not present

## 2016-09-11 DIAGNOSIS — N2581 Secondary hyperparathyroidism of renal origin: Secondary | ICD-10-CM | POA: Diagnosis not present

## 2016-09-11 DIAGNOSIS — I10 Essential (primary) hypertension: Secondary | ICD-10-CM | POA: Diagnosis not present

## 2016-09-12 ENCOUNTER — Telehealth: Payer: Self-pay | Admitting: Internal Medicine

## 2016-09-12 NOTE — Telephone Encounter (Signed)
Rec'd from [place] forwarded 3 pages to Crecencio Mc MD

## 2016-09-25 DIAGNOSIS — R69 Illness, unspecified: Secondary | ICD-10-CM | POA: Diagnosis not present

## 2016-10-15 DIAGNOSIS — N189 Chronic kidney disease, unspecified: Secondary | ICD-10-CM | POA: Diagnosis not present

## 2016-10-15 DIAGNOSIS — H6121 Impacted cerumen, right ear: Secondary | ICD-10-CM | POA: Diagnosis not present

## 2016-10-15 DIAGNOSIS — I10 Essential (primary) hypertension: Secondary | ICD-10-CM | POA: Diagnosis not present

## 2016-10-15 DIAGNOSIS — K08109 Complete loss of teeth, unspecified cause, unspecified class: Secondary | ICD-10-CM | POA: Diagnosis not present

## 2016-10-15 DIAGNOSIS — E1122 Type 2 diabetes mellitus with diabetic chronic kidney disease: Secondary | ICD-10-CM | POA: Diagnosis not present

## 2016-10-15 DIAGNOSIS — Z6824 Body mass index (BMI) 24.0-24.9, adult: Secondary | ICD-10-CM | POA: Diagnosis not present

## 2016-10-15 DIAGNOSIS — G4700 Insomnia, unspecified: Secondary | ICD-10-CM | POA: Diagnosis not present

## 2016-10-15 DIAGNOSIS — E7800 Pure hypercholesterolemia, unspecified: Secondary | ICD-10-CM | POA: Diagnosis not present

## 2016-10-15 DIAGNOSIS — Z0000 Encounter for general adult medical examination without abnormal findings: Secondary | ICD-10-CM | POA: Diagnosis not present

## 2016-10-15 DIAGNOSIS — M47819 Spondylosis without myelopathy or radiculopathy, site unspecified: Secondary | ICD-10-CM | POA: Diagnosis not present

## 2016-10-30 ENCOUNTER — Other Ambulatory Visit: Payer: Self-pay | Admitting: Internal Medicine

## 2016-11-01 ENCOUNTER — Other Ambulatory Visit: Payer: Self-pay | Admitting: Internal Medicine

## 2016-11-05 ENCOUNTER — Other Ambulatory Visit: Payer: Self-pay | Admitting: Internal Medicine

## 2016-11-23 ENCOUNTER — Other Ambulatory Visit: Payer: Self-pay | Admitting: Internal Medicine

## 2016-11-27 ENCOUNTER — Other Ambulatory Visit: Payer: Self-pay

## 2016-11-27 ENCOUNTER — Telehealth: Payer: Self-pay | Admitting: Internal Medicine

## 2016-11-27 MED ORDER — DOXAZOSIN MESYLATE 2 MG PO TABS: 2.0000 mg | ORAL_TABLET | Freq: Every day | ORAL | 0 refills | Status: DC

## 2016-11-27 NOTE — Telephone Encounter (Signed)
Pt. Has appointment for follow up. Cardura refilled for 1 month only.

## 2016-11-27 NOTE — Telephone Encounter (Signed)
Copied from [place]. Topic: Quick Communication - See Telephone Encounter >> Nov 27, 2016  9:37 AM Corie Chiquito, [place] wrote: CRM for notification. See Telephone encounter for: Patient called because she still needs her Docazosin refilled. If someone could give her a call back about this matter.  11/27/16.

## 2016-12-05 ENCOUNTER — Other Ambulatory Visit: Payer: Self-pay | Admitting: Internal Medicine

## 2016-12-12 ENCOUNTER — Other Ambulatory Visit: Payer: Self-pay | Admitting: Internal Medicine

## 2016-12-20 ENCOUNTER — Ambulatory Visit (INDEPENDENT_AMBULATORY_CARE_PROVIDER_SITE_OTHER): Payer: Medicare HMO | Admitting: Internal Medicine

## 2016-12-20 ENCOUNTER — Encounter: Payer: Self-pay | Admitting: Internal Medicine

## 2016-12-20 VITALS — BP 110/68 | HR 82 | Temp 97.8°F | Resp 16 | Ht 60.0 in | Wt 123.6 lb

## 2016-12-20 DIAGNOSIS — E785 Hyperlipidemia, unspecified: Secondary | ICD-10-CM

## 2016-12-20 DIAGNOSIS — N182 Chronic kidney disease, stage 2 (mild): Secondary | ICD-10-CM

## 2016-12-20 DIAGNOSIS — I10 Essential (primary) hypertension: Secondary | ICD-10-CM | POA: Diagnosis not present

## 2016-12-20 DIAGNOSIS — E1122 Type 2 diabetes mellitus with diabetic chronic kidney disease: Secondary | ICD-10-CM

## 2016-12-20 DIAGNOSIS — J309 Allergic rhinitis, unspecified: Secondary | ICD-10-CM

## 2016-12-20 LAB — COMPREHENSIVE METABOLIC PANEL
ALBUMIN: 3.9 g/dL (ref 3.5–5.2)
ALK PHOS: 70 U/L (ref 39–117)
ALT: 10 U/L (ref 0–35)
AST: 11 U/L (ref 0–37)
BUN: 23 mg/dL (ref 6–23)
CHLORIDE: 109 meq/L (ref 96–112)
CO2: 25 mEq/L (ref 19–32)
CREATININE: 1.64 mg/dL — AB (ref 0.40–1.20)
Calcium: 9 mg/dL (ref 8.4–10.5)
GFR: 32.15 mL/min — ABNORMAL LOW (ref >60.00)
GLUCOSE: 115 mg/dL — AB (ref 70–99)
POTASSIUM: 4.2 meq/L (ref 3.5–5.1)
SODIUM: 142 meq/L (ref 135–145)
TOTAL PROTEIN: 6.6 g/dL (ref 6.0–8.3)
Total Bilirubin: 0.5 mg/dL (ref 0.2–1.2)

## 2016-12-20 LAB — LIPID PANEL
CHOLESTEROL: 117 mg/dL (ref 0–200)
HDL: 45.9 mg/dL (ref >39.00)
LDL Cholesterol: 55 mg/dL (ref 0–99)
NONHDL: 71.59
Total CHOL/HDL Ratio: 3
Triglycerides: 85 mg/dL (ref 0.0–149.0)
VLDL: 17 mg/dL (ref 0.0–40.0)

## 2016-12-20 LAB — HEMOGLOBIN A1C: HEMOGLOBIN A1C: 6.3 % (ref 4.6–6.5)

## 2016-12-20 MED ORDER — TETANUS-DIPHTH-ACELL PERTUSSIS 5-2.5-18.5 LF-MCG/0.5 IM SUSP: 0.5000 mL | Freq: Once | INTRAMUSCULAR | 0 refills | Status: AC

## 2016-12-20 MED ORDER — FEXOFENADINE HCL 180 MG PO TABS: 180.0000 mg | ORAL_TABLET | Freq: Every day | ORAL | 5 refills | Status: DC

## 2016-12-20 NOTE — Progress Notes (Signed)
Subjective:  Patient ID: Candace Monroe, female    DOB: 25-Jun-1940  Age: 76 y.o. MRN: [id number]  CC: The primary encounter diagnosis was Type 2 diabetes mellitus with stage 2 chronic kidney disease, without long-term current use of insulin ([place]). Diagnoses of Hyperlipidemia LDL goal <100, Essential hypertension, and Allergic rhinitis, unspecified seasonality, unspecified trigger were also pertinent to this visit.  HPI Candace Monroe presents for 6 month follow up on diabetes, diet controlled. Hypertension and hyperlipidemia.  Patient has no complaints today.  Patient is following a low glycemic index diet and taking all prescribed medications regularly without side effects.  Fasting sugars have been under less than 140 most of the time and post prandials have been under 160 except on rare occasions. Patient is exercising about 3 times per week and intentionally trying to lose weight .  Patient has had an eye exam in the last 12 months and checks feet regularly for signs of infection.  Patient does not walk barefoot outside,  And denies an numbness tingling or burning in feet. Patient is up to date on all recommended vaccinations.  No falls  no bowel or bladder issues  Sinus congestion on a daily basis,  Drainage is clear most of the time.  Sneezing . Not taking an antihistamine.  Allegra rx'd.   Has a dog,  But she is not new.     nobody smokes.   Lab Results  Component Value Date   TSH 1.49 12/20/2016    HAD HER FLU VACCINE   Lab Results  Component Value Date   HGBA1C 6.3 12/20/2016     Lab Results  Component Value Date   MICROALBUR 211.9 (H) 06/14/2015     Outpatient Medications Prior to Visit  Medication Sig Dispense Refill  . amitriptyline (ELAVIL) 10 MG tablet TAKE 1 TABLET BY MOUTH AT BEDTIME AS NEEDED FOR INSOMNIA 90 tablet 1  . amLODipine (NORVASC) 10 MG tablet TAKE 1 TABLET BY MOUTH EVERY DAY 90 tablet 3  . amLODipine (NORVASC) 10 MG tablet TAKE 1 TABLET BY MOUTH EVERY  DAY 90 tablet 0  . aspirin 81 MG tablet Take 81 mg by mouth daily.    Marland Kitchen atorvastatin (LIPITOR) 20 MG tablet TAKE 1 TABLET BY MOUTH EVERY DAY 90 tablet 1  . blood glucose meter kit and supplies Please dispense One Touch meter, E11.22 1 each 0  . doxazosin (CARDURA) 2 MG tablet Take 1 tablet (2 mg total) by mouth daily. NEEDS OV FOR NEXT SET OF REFILLS 30 tablet 0  . famotidine (PEPCID) 20 MG tablet Take 1 tablet (20 mg total) by mouth daily. 90 tablet 3  . fluticasone (FLONASE) 50 MCG/ACT nasal spray Place 1 spray into both nostrils daily.    Marland Kitchen glucose blood test strip Use as instructed, one touch to check blood glucose up to 3 times a day. E11.22 100 each 12  . Lancets (FREESTYLE) lancets USE AS DIRECTED ONCE DAILY 100 each 0  . losartan (COZAAR) 50 MG tablet TAKE 1 TABLET (50 MG TOTAL) BY MOUTH DAILY. 90 tablet 1  . senna-docusate (SENOKOT-S) 8.6-50 MG per tablet Take 2 tablets by mouth at bedtime. 60 tablet 0  . traMADol (ULTRAM) 50 MG tablet TAKE 1 TABLET BY MOUTH EVERY 6 HOURS AS NEEDED FOR MODERATE PAIN 30 tablet 4  . Vitamin D, Ergocalciferol, (DRISDOL) 50000 units CAPS capsule TAKE 1 CAPSULE BY MOUTH ONCE A WEEK (Patient not taking: Reported on 12/20/2016) 12 capsule 3   No  facility-administered medications prior to visit.     Review of Systems;  Patient denies headache, fevers, malaise, unintentional weight loss, skin rash, eye pain, sinus congestion and sinus pain, sore throat, dysphagia,  hemoptysis , cough, dyspnea, wheezing, chest pain, palpitations, orthopnea, edema, abdominal pain, nausea, melena, diarrhea, constipation, flank pain, dysuria, hematuria, urinary  Frequency, nocturia, numbness, tingling, seizures,  Focal weakness, Loss of consciousness,  Tremor, insomnia, depression, anxiety, and suicidal ideation.      Objective:  BP 110/68 (BP Location: Left Arm, Patient Position: Sitting, Cuff Size: Normal)   Pulse 82   Temp 97.8 F (36.6 C) (Oral)   Resp 16   Ht 5' (1.524  m)   Wt 123 lb 9.6 oz (56.1 kg)   SpO2 97%   BMI 24.14 kg/m   BP Readings from Last 3 Encounters:  12/20/16 110/68  06/20/16 (!) 140/58  03/22/16 136/70    Wt Readings from Last 3 Encounters:  12/20/16 123 lb 9.6 oz (56.1 kg)  06/20/16 128 lb 12.8 oz (58.4 kg)  03/22/16 131 lb (59.4 kg)    General appearance: alert, cooperative and appears stated age Ears: normal TM's and external ear canals both ears Throat: lips, mucosa, and tongue normal; teeth and gums normal Neck: no adenopathy, no carotid bruit, supple, symmetrical, trachea midline and thyroid not enlarged, symmetric, no tenderness/mass/nodules Back: symmetric, no curvature. ROM normal. No CVA tenderness. Lungs: clear to auscultation bilaterally Heart: regular rate and rhythm, S1, S2 normal, no murmur, click, rub or gallop Abdomen: soft, non-tender; bowel sounds normal; no masses,  no organomegaly Pulses: 2+ and symmetric Skin: Skin color, texture, turgor normal. No rashes or lesions Lymph nodes: Cervical, supraclavicular, and axillary nodes normal.  Lab Results  Component Value Date   HGBA1C 6.3 12/20/2016   HGBA1C 5.9 06/20/2016   HGBA1C 5.8 02/22/2016    Lab Results  Component Value Date   CREATININE 1.64 (H) 12/20/2016   CREATININE 1.94 (H) 03/06/2016   CREATININE 1.53 (H) 02/22/2016    Lab Results  Component Value Date   WBC 7.1 05/31/2015   HGB 11.6 (L) 05/31/2015   HCT 35.6 (L) 05/31/2015   PLT 232.0 05/31/2015   GLUCOSE 115 (H) 12/20/2016   CHOL 117 12/20/2016   TRIG 85.0 12/20/2016   HDL 45.90 12/20/2016   LDLDIRECT 73.0 02/22/2016   LDLCALC 55 12/20/2016   ALT 10 12/20/2016   AST 11 12/20/2016   NA 142 12/20/2016   K 4.2 12/20/2016   CL 109 12/20/2016   CREATININE 1.64 (H) 12/20/2016   BUN 23 12/20/2016   CO2 25 12/20/2016   TSH 1.49 12/20/2016   HGBA1C 6.3 12/20/2016   MICROALBUR 211.9 (H) 06/14/2015    No results found.  Assessment & Plan:   Problem List Items Addressed This  Visit    Allergic rhinitis    Supportive care outlined      Diabetes mellitus with chronic kidney disease ([place]) - Primary    well-controlled on current medications.  hemoglobin A1c has been consistently at or  less than 7.0 . Patient is up-to-date on eye exams and foot exam is normal today. Patient is followed by nephrology for nephropathy ane CKD.  Patient is tolerating statin therapy for CAD risk reduction , but is not on ACE/ARB  Because of prior intolerance.    Lab Results  Component Value Date   HGBA1C 6.3 12/20/2016   Lab Results  Component Value Date   CHOL 117 12/20/2016   HDL 45.90 12/20/2016  LDLCALC 55 12/20/2016   LDLDIRECT 73.0 02/22/2016   TRIG 85.0 12/20/2016   CHOLHDL 3 12/20/2016   Lab Results  Component Value Date   MICROALBUR 211.9 (H) 06/14/2015           Relevant Orders   Hemoglobin A1c (Completed)   Lipid panel (Completed)   Comprehensive metabolic panel (Completed)   Microalbumin / creatinine urine ratio   HTN (hypertension)    Well controlled on current regimen. Renal function stable, no changes today.  Lab Results  Component Value Date   CREATININE 1.64 (H) 12/20/2016   Lab Results  Component Value Date   NA 142 12/20/2016   K 4.2 12/20/2016   CL 109 12/20/2016   CO2 25 12/20/2016         Hyperlipidemia LDL goal <100    LDL and triglycerides are at goal on current medications. She  has no side effects and liver enzymes are normal. No changes today  Lab Results  Component Value Date   CHOL 117 12/20/2016   HDL 45.90 12/20/2016   LDLCALC 55 12/20/2016   LDLDIRECT 73.0 02/22/2016   TRIG 85.0 12/20/2016   CHOLHDL 3 12/20/2016   Lab Results  Component Value Date   ALT 10 12/20/2016   AST 11 12/20/2016   ALKPHOS 70 12/20/2016   BILITOT 0.5 12/20/2016         Relevant Orders   TSH (Completed)      I have discontinued Gay Filler B. Konigsberg's Vitamin D (Ergocalciferol). I am also having her start on fexofenadine and Tdap.  Additionally, I am having her maintain her senna-docusate, aspirin, fluticasone, amLODipine, freestyle, famotidine, glucose blood, blood glucose meter kit and supplies, traMADol, losartan, amLODipine, doxazosin, atorvastatin, and amitriptyline.  Meds ordered this encounter  Medications  . fexofenadine (ALLEGRA) 180 MG tablet    Sig: Take 1 tablet (180 mg total) by mouth daily.    Dispense:  30 tablet    Refill:  5  . Tdap (BOOSTRIX) 5-2.5-18.5 LF-MCG/0.5 injection    Sig: Inject 0.5 mLs into the muscle once for 1 dose.    Dispense:  0.5 mL    Refill:  0    Medications Discontinued During This Encounter  Medication Reason  . Vitamin D, Ergocalciferol, (DRISDOL) 50000 units CAPS capsule Completed Course    Follow-up: No Follow-up on file.   Crecencio Mc, MD

## 2016-12-20 NOTE — Patient Instructions (Addendum)
I recommend trying generic allegra (fexofenadine ) once daily as needed for  YOUR SINUS CONGESTION  DUE TO ALLERGIES    You should try [employer]'s Sinus rinse ;  It is a stong sinus "flush" using water and medicated salts.  Do it over the sink because it can be a bit messy  YOU RECEIVED YOUR PNEUMONIA VACCINE TODAY    You ARE OVEDUE FOR YOUR  tetanus-diptheria-pertussis vaccine (TDaP) TO PROTECT YOU FROM TETANUS AND WHOOPING COUGH.  The TDap is $110 out of pocket since medicare will not pay for it,   [facility] pharmacies will offer it for far less  [employer] supposed;y offers it for $50.)

## 2016-12-21 LAB — TSH: TSH: 1.49 u[IU]/mL (ref 0.35–4.50)

## 2016-12-22 DIAGNOSIS — J309 Allergic rhinitis, unspecified: Secondary | ICD-10-CM | POA: Insufficient documentation

## 2016-12-22 NOTE — Assessment & Plan Note (Signed)
Supportive care outlined  ?

## 2016-12-22 NOTE — Assessment & Plan Note (Signed)
well-controlled on current medications.  hemoglobin A1c has been consistently at or  less than 7.0 . Patient is up-to-date on eye exams and foot exam is normal today. Patient is followed by nephrology for nephropathy ane CKD.  Patient is tolerating statin therapy for CAD risk reduction , but is not on ACE/ARB  Because of prior intolerance.    Lab Results  Component Value Date   HGBA1C 6.3 12/20/2016   Lab Results  Component Value Date   CHOL 117 12/20/2016   HDL 45.90 12/20/2016   LDLCALC 55 12/20/2016   LDLDIRECT 73.0 02/22/2016   TRIG 85.0 12/20/2016   CHOLHDL 3 12/20/2016   Lab Results  Component Value Date   MICROALBUR 211.9 (H) 06/14/2015

## 2016-12-22 NOTE — Assessment & Plan Note (Signed)
LDL and triglycerides are at goal on current medications. She  has no side effects and liver enzymes are normal. No changes today  Lab Results  Component Value Date   CHOL 117 12/20/2016   HDL 45.90 12/20/2016   LDLCALC 55 12/20/2016   LDLDIRECT 73.0 02/22/2016   TRIG 85.0 12/20/2016   CHOLHDL 3 12/20/2016   Lab Results  Component Value Date   ALT 10 12/20/2016   AST 11 12/20/2016   ALKPHOS 70 12/20/2016   BILITOT 0.5 12/20/2016

## 2016-12-22 NOTE — Assessment & Plan Note (Signed)
Well controlled on current regimen. Renal function stable, no changes today.  Lab Results  Component Value Date   CREATININE 1.64 (H) 12/20/2016   Lab Results  Component Value Date   NA 142 12/20/2016   K 4.2 12/20/2016   CL 109 12/20/2016   CO2 25 12/20/2016

## 2016-12-24 ENCOUNTER — Other Ambulatory Visit: Payer: Self-pay | Admitting: Internal Medicine

## 2017-01-07 DIAGNOSIS — N184 Chronic kidney disease, stage 4 (severe): Secondary | ICD-10-CM | POA: Diagnosis not present

## 2017-01-07 DIAGNOSIS — N2581 Secondary hyperparathyroidism of renal origin: Secondary | ICD-10-CM | POA: Diagnosis not present

## 2017-01-07 DIAGNOSIS — R809 Proteinuria, unspecified: Secondary | ICD-10-CM | POA: Diagnosis not present

## 2017-01-07 DIAGNOSIS — I10 Essential (primary) hypertension: Secondary | ICD-10-CM | POA: Diagnosis not present

## 2017-01-07 DIAGNOSIS — E872 Acidosis: Secondary | ICD-10-CM | POA: Diagnosis not present

## 2017-01-08 DIAGNOSIS — E872 Acidosis: Secondary | ICD-10-CM | POA: Diagnosis not present

## 2017-01-08 DIAGNOSIS — I10 Essential (primary) hypertension: Secondary | ICD-10-CM | POA: Diagnosis not present

## 2017-01-08 DIAGNOSIS — N2581 Secondary hyperparathyroidism of renal origin: Secondary | ICD-10-CM | POA: Diagnosis not present

## 2017-01-08 DIAGNOSIS — R809 Proteinuria, unspecified: Secondary | ICD-10-CM | POA: Diagnosis not present

## 2017-01-08 DIAGNOSIS — N184 Chronic kidney disease, stage 4 (severe): Secondary | ICD-10-CM | POA: Diagnosis not present

## 2017-01-14 ENCOUNTER — Other Ambulatory Visit: Payer: Self-pay | Admitting: Internal Medicine

## 2017-01-14 MED ORDER — SODIUM BICARBONATE 650 MG PO TABS: 650.0000 mg | ORAL_TABLET | Freq: Two times a day (BID) | ORAL | 1 refills | Status: DC

## 2017-01-14 MED ORDER — LOSARTAN POTASSIUM 100 MG PO TABS: 100.0000 mg | ORAL_TABLET | Freq: Every day | ORAL | 0 refills | Status: AC

## 2017-01-17 ENCOUNTER — Inpatient Hospital Stay: Payer: Medicare HMO | Attending: Oncology | Admitting: Oncology

## 2017-01-17 ENCOUNTER — Inpatient Hospital Stay: Payer: Medicare HMO

## 2017-01-17 ENCOUNTER — Encounter: Payer: Self-pay | Admitting: Oncology

## 2017-01-17 VITALS — BP 143/54 | HR 72 | Temp 98.4°F | Resp 22 | Wt 128.0 lb

## 2017-01-17 DIAGNOSIS — Z90710 Acquired absence of both cervix and uterus: Secondary | ICD-10-CM | POA: Insufficient documentation

## 2017-01-17 DIAGNOSIS — N189 Chronic kidney disease, unspecified: Principal | ICD-10-CM

## 2017-01-17 DIAGNOSIS — E538 Deficiency of other specified B group vitamins: Secondary | ICD-10-CM | POA: Insufficient documentation

## 2017-01-17 DIAGNOSIS — M419 Scoliosis, unspecified: Secondary | ICD-10-CM | POA: Insufficient documentation

## 2017-01-17 DIAGNOSIS — N2581 Secondary hyperparathyroidism of renal origin: Secondary | ICD-10-CM | POA: Insufficient documentation

## 2017-01-17 DIAGNOSIS — M109 Gout, unspecified: Secondary | ICD-10-CM | POA: Insufficient documentation

## 2017-01-17 DIAGNOSIS — R5383 Other fatigue: Secondary | ICD-10-CM | POA: Diagnosis not present

## 2017-01-17 DIAGNOSIS — N184 Chronic kidney disease, stage 4 (severe): Secondary | ICD-10-CM | POA: Diagnosis not present

## 2017-01-17 DIAGNOSIS — D631 Anemia in chronic kidney disease: Secondary | ICD-10-CM | POA: Diagnosis not present

## 2017-01-17 DIAGNOSIS — E785 Hyperlipidemia, unspecified: Secondary | ICD-10-CM | POA: Diagnosis not present

## 2017-01-17 DIAGNOSIS — E1122 Type 2 diabetes mellitus with diabetic chronic kidney disease: Secondary | ICD-10-CM | POA: Insufficient documentation

## 2017-01-17 DIAGNOSIS — Z87891 Personal history of nicotine dependence: Secondary | ICD-10-CM | POA: Insufficient documentation

## 2017-01-17 DIAGNOSIS — Z9049 Acquired absence of other specified parts of digestive tract: Secondary | ICD-10-CM | POA: Diagnosis not present

## 2017-01-17 DIAGNOSIS — I129 Hypertensive chronic kidney disease with stage 1 through stage 4 chronic kidney disease, or unspecified chronic kidney disease: Secondary | ICD-10-CM | POA: Insufficient documentation

## 2017-01-17 DIAGNOSIS — F329 Major depressive disorder, single episode, unspecified: Secondary | ICD-10-CM | POA: Diagnosis not present

## 2017-01-17 DIAGNOSIS — M810 Age-related osteoporosis without current pathological fracture: Secondary | ICD-10-CM | POA: Diagnosis not present

## 2017-01-17 DIAGNOSIS — D509 Iron deficiency anemia, unspecified: Secondary | ICD-10-CM | POA: Insufficient documentation

## 2017-01-17 DIAGNOSIS — Z79899 Other long term (current) drug therapy: Secondary | ICD-10-CM | POA: Insufficient documentation

## 2017-01-17 DIAGNOSIS — G8191 Hemiplegia, unspecified affecting right dominant side: Secondary | ICD-10-CM | POA: Diagnosis not present

## 2017-01-17 DIAGNOSIS — R69 Illness, unspecified: Secondary | ICD-10-CM | POA: Diagnosis not present

## 2017-01-17 DIAGNOSIS — G4730 Sleep apnea, unspecified: Secondary | ICD-10-CM | POA: Diagnosis not present

## 2017-01-17 DIAGNOSIS — Z8673 Personal history of transient ischemic attack (TIA), and cerebral infarction without residual deficits: Secondary | ICD-10-CM | POA: Diagnosis not present

## 2017-01-17 DIAGNOSIS — Z7982 Long term (current) use of aspirin: Secondary | ICD-10-CM | POA: Diagnosis not present

## 2017-01-17 DIAGNOSIS — E1121 Type 2 diabetes mellitus with diabetic nephropathy: Secondary | ICD-10-CM | POA: Insufficient documentation

## 2017-01-17 DIAGNOSIS — I2510 Atherosclerotic heart disease of native coronary artery without angina pectoris: Secondary | ICD-10-CM | POA: Diagnosis not present

## 2017-01-17 LAB — COMPREHENSIVE METABOLIC PANEL
ALT: 17 U/L (ref 14–54)
AST: 19 U/L (ref 15–41)
Albumin: 3.5 g/dL (ref 3.5–5.0)
Alkaline Phosphatase: 68 U/L (ref 38–126)
Anion gap: 8 (ref 5–15)
BILIRUBIN TOTAL: 0.5 mg/dL (ref 0.3–1.2)
BUN: 27 mg/dL — AB (ref 6–20)
CO2: 25 mmol/L (ref 22–32)
Calcium: 8.9 mg/dL (ref 8.9–10.3)
Chloride: 110 mmol/L (ref 101–111)
Creatinine, Ser: 1.64 mg/dL — ABNORMAL HIGH (ref 0.44–1.00)
GFR, EST AFRICAN AMERICAN: 33 mL/min — AB (ref >60)
GFR, EST NON AFRICAN AMERICAN: 29 mL/min — AB (ref >60)
Glucose, Bld: 113 mg/dL — ABNORMAL HIGH (ref 65–99)
POTASSIUM: 4.3 mmol/L (ref 3.5–5.1)
Sodium: 143 mmol/L (ref 135–145)
TOTAL PROTEIN: 6.5 g/dL (ref 6.5–8.1)

## 2017-01-17 LAB — FOLATE: FOLATE: 29 ng/mL (ref >5.9)

## 2017-01-17 LAB — CBC WITH DIFFERENTIAL/PLATELET
BASOS ABS: 0.1 10*3/uL (ref 0–0.1)
Basophils Relative: 1 %
EOS ABS: 0.3 10*3/uL (ref 0–0.7)
EOS PCT: 4 %
HCT: 29.8 % — ABNORMAL LOW (ref 35.0–47.0)
HEMOGLOBIN: 9.6 g/dL — AB (ref 12.0–16.0)
LYMPHS PCT: 13 %
Lymphs Abs: 1.1 10*3/uL (ref 1.0–3.6)
MCH: 26.6 pg (ref 26.0–34.0)
MCHC: 32 g/dL (ref 32.0–36.0)
MCV: 83.1 fL (ref 80.0–100.0)
Monocytes Absolute: 0.7 10*3/uL (ref 0.2–0.9)
Monocytes Relative: 9 %
NEUTROS PCT: 73 %
Neutro Abs: 5.9 10*3/uL (ref 1.4–6.5)
PLATELETS: 222 10*3/uL (ref 150–440)
RBC: 3.59 MIL/uL — AB (ref 3.80–5.20)
RDW: 14.6 % — ABNORMAL HIGH (ref 11.5–14.5)
WBC: 8 10*3/uL (ref 3.6–11.0)

## 2017-01-17 LAB — RETICULOCYTES
RBC.: 3.54 MIL/uL — AB (ref 3.80–5.20)
Retic Count, Absolute: 60.2 10*3/uL (ref 19.0–183.0)
Retic Ct Pct: 1.7 % (ref 0.4–3.1)

## 2017-01-17 LAB — FERRITIN: Ferritin: 11 ng/mL (ref 11–307)

## 2017-01-17 LAB — IRON AND TIBC
IRON: 20 ug/dL — AB (ref 28–170)
SATURATION RATIOS: 5 % — AB (ref 10.4–31.8)
TIBC: 410 ug/dL (ref 250–450)
UIBC: 390 ug/dL

## 2017-01-17 LAB — VITAMIN B12: VITAMIN B 12: 268 pg/mL (ref 180–914)

## 2017-01-17 NOTE — Progress Notes (Signed)
Hematology/Oncology Consult note [facility] [phone] [phone]  Patient Care Team: Crecencio Mc, MD as PCP - General (Internal Medicine) Birdie Sons, MD as Referring Physician (Family Medicine) Bary Castilla Forest Gleason, MD (General Surgery)   Name of the patient: Candace Monroe  [id number]  1940/04/28    Reason for referral- anemia   Referring physician- Dr. Holley Raring  Date of visit: 01/17/17   History of presenting illness-patient is a 77 year old female with past medical history significant for hypertension, hyperlipidemia, stage IV CKD, secondary hyperparathyroidism, no other medical problems.  She has been referred to [place] for evaluation and management of anemia.  She also has a history of stroke 3 years ago with some residual right-sided hemiparesis for which she uses a walker to ambulate.  Recent blood work checked at the nephrology office revealed a BUN of 27 and creatinine of 1.8.  CBC showed white count of 7.4, H&H of 9.7/31 with an MCV of 85 and a platelet count of 291.  Random protein electrophoresis did not reveal any evidence of M spike.  Liver function tests were within normal limits.  Patient reports mild chronic fatigue.  Denies any blood loss in stool or urine.  Denies any routine use of NSAIDs.  She is able to perform her ADLs but needs assistance with IADLs  ECOG PS- 2  Pain scale- 0   Review of systems- Review of Systems  Constitutional: Negative for chills, fever, malaise/fatigue and weight loss.  HENT: Negative for congestion, ear discharge and nosebleeds.   Eyes: Negative for blurred vision.  Respiratory: Negative for cough, hemoptysis, sputum production, shortness of breath and wheezing.   Cardiovascular: Negative for chest pain, palpitations, orthopnea and claudication.  Gastrointestinal: Negative for abdominal pain, blood in stool, constipation, diarrhea, heartburn, melena, nausea and vomiting.  Genitourinary:  Negative for dysuria, flank pain, frequency, hematuria and urgency.  Musculoskeletal: Negative for back pain, joint pain and myalgias.  Skin: Negative for rash.  Neurological: Negative for dizziness, tingling, focal weakness, seizures, weakness and headaches.  Endo/Heme/Allergies: Does not bruise/bleed easily.  Psychiatric/Behavioral: Negative for depression and suicidal ideas. The patient does not have insomnia.     Allergies  Allergen Reactions  . Ace Inhibitors     Renal insufficiency  . Augmentin [Amoxicillin-Pot Clavulanate] Itching  . Sulfa Antibiotics Itching    Patient Active Problem List   Diagnosis Date Noted  . Allergic rhinitis 12/22/2016  . Insomnia secondary to situational depression 06/23/2016  . Fracture of lateral malleolus of right ankle 03/06/2016  . Diabetic nephropathy ([place]) 06/03/2015  . Hyperlipidemia LDL goal <100 06/03/2015  . Osteoporosis 12/03/2014  . Carotid artery stenosis with cerebral infarction ([place]) 12/01/2014  . Diabetes mellitus with chronic kidney disease ([place]) 09/29/2014  . Lumbar scoliosis 11/13/2013  . Abnormal mammogram of left breast 11/13/2013  . Gout flare 10/14/2013  . HTN (hypertension) 10/07/2013  . Major depressive disorder, recurrent episode, in partial remission ([place]) 10/07/2013  . Spastic hemiparesis affecting dominant side ([place]) 10/06/2013     Past Medical History:  Diagnosis Date  . Anemia   . Arthritis   . Breast mass   . Chronic kidney disease   . Depression   . Diabetes mellitus without complication ([place])   . Diverticulitis   . HTN (hypertension)   . Hyperlipidemia   . Stroke ([place]) Sept-Oct 2015  . Urine incontinence      Past Surgical History:  Procedure Laterality Date  . ABDOMINAL HYSTERECTOMY  1973  . CHOLECYSTECTOMY  1990  . TONSILLECTOMY      Social History   Socioeconomic History  . Marital status: Married    Spouse name: Not on file  . Number of children: Not on file  . Years of education:  Not on file  . Highest education level: Not on file  Social Needs  . Financial resource strain: Not on file  . Food insecurity - worry: Not on file  . Food insecurity - inability: Not on file  . Transportation needs - medical: Not on file  . Transportation needs - non-medical: Not on file  Occupational History  . Not on file  Tobacco Use  . Smoking status: Former Smoker    Years: 50.00    Last attempt to quit: 09/01/2013    Years since quitting: 3.3  . Smokeless tobacco: Never Used  Substance and Sexual Activity  . Alcohol use: No    Alcohol/week: 0.0 oz  . Drug use: No  . Sexual activity: No  Other Topics Concern  . Not on file  Social History Narrative  . Not on file     Family History  Problem Relation Age of Onset  . Stroke Mother   . Cancer Sister 8       breast  . Diabetes Brother   . Seizures Sister      Current Outpatient Medications:  .  amitriptyline (ELAVIL) 10 MG tablet, TAKE 1 TABLET BY MOUTH AT BEDTIME AS NEEDED FOR INSOMNIA, Disp: 90 tablet, Rfl: 1 .  amLODipine (NORVASC) 10 MG tablet, TAKE 1 TABLET BY MOUTH EVERY DAY, Disp: 90 tablet, Rfl: 3 .  aspirin 81 MG tablet, Take 81 mg by mouth daily., Disp: , Rfl:  .  atorvastatin (LIPITOR) 20 MG tablet, TAKE 1 TABLET BY MOUTH EVERY DAY, Disp: 90 tablet, Rfl: 1 .  blood glucose meter kit and supplies, Please dispense One Touch meter, E11.22, Disp: 1 each, Rfl: 0 .  doxazosin (CARDURA) 2 MG tablet, Take 1 tablet (2 mg total) by mouth daily., Disp: 30 tablet, Rfl: 2 .  famotidine (PEPCID) 20 MG tablet, Take 1 tablet (20 mg total) by mouth daily., Disp: 90 tablet, Rfl: 3 .  fexofenadine (ALLEGRA) 180 MG tablet, Take 1 tablet (180 mg total) by mouth daily., Disp: 30 tablet, Rfl: 5 .  fluticasone (FLONASE) 50 MCG/ACT nasal spray, Place 1 spray into both nostrils daily., Disp: , Rfl:  .  glucose blood test strip, Use as instructed, one touch to check blood glucose up to 3 times a day. E11.22, Disp: 100 each, Rfl: 12 .   Lancets (FREESTYLE) lancets, USE AS DIRECTED ONCE DAILY, Disp: 100 each, Rfl: 0 .  losartan (COZAAR) 100 MG tablet, Take 1 tablet (100 mg total) by mouth daily., Disp: 90 tablet, Rfl: 0 .  senna-docusate (SENOKOT-S) 8.6-50 MG per tablet, Take 2 tablets by mouth at bedtime., Disp: 60 tablet, Rfl: 0 .  sodium bicarbonate 650 MG tablet, Take 1 tablet (650 mg total) by mouth 2 (two) times daily., Disp: 180 tablet, Rfl: 1 .  traMADol (ULTRAM) 50 MG tablet, TAKE 1 TABLET BY MOUTH EVERY 6 HOURS AS NEEDED FOR MODERATE PAIN, Disp: 30 tablet, Rfl: 4   Physical exam:  Vitals:   01/17/17 1004 01/17/17 1007  BP:  (!) 143/54  Pulse:  72  Resp:  (!) 22  Temp:  98.4 F (36.9 C)  TempSrc:  Tympanic  SpO2:  99%  Weight: 128 lb (58.1 kg)    Physical Exam  Constitutional: She  is oriented to person, place, and time.  Thin elderly female who ambulates with a walker  HENT:  Head: Normocephalic and atraumatic.  Eyes: EOM are normal. Pupils are equal, round, and reactive to light.  Neck: Normal range of motion.  Cardiovascular: Normal rate, regular rhythm and normal heart sounds.  Pulmonary/Chest: Effort normal and breath sounds normal.  Abdominal: Soft. Bowel sounds are normal.  Neurological: She is alert and oriented to person, place, and time.  Skin: Skin is warm and dry.       CMP Latest Ref Rng & Units 12/20/2016  Glucose 70 - 99 mg/dL 115(H)  BUN 6 - 23 mg/dL 23  Creatinine 0.40 - 1.20 mg/dL 1.64(H)  Sodium 135 - 145 mEq/L 142  Potassium 3.5 - 5.1 mEq/L 4.2  Chloride 96 - 112 mEq/L 109  CO2 19 - 32 mEq/L 25  Calcium 8.4 - 10.5 mg/dL 9.0  Total Protein 6.0 - 8.3 g/dL 6.6  Total Bilirubin 0.2 - 1.2 mg/dL 0.5  Alkaline Phos 39 - 117 U/L 70  AST 0 - 37 U/L 11  ALT 0 - 35 U/L 10   CBC Latest Ref Rng & Units 05/31/2015  WBC 4.0 - 10.5 K/uL 7.1  Hemoglobin 12.0 - 15.0 g/dL 11.6(L)  Hematocrit 36.0 - 46.0 % 35.6(L)  Platelets 150.0 - 400.0 K/uL 232.0     Assessment and plan- Patient is  a 77 y.o. female referred for anemia likely secondary to chronic kidney disease  Recent TSH checked in December 2018 was within normal limits.  Today I will do a complete anemia workup including CBC with differential, CMP, ferritin and iron studies, B12, folate, reticulocyte count, haptoglobin and myeloma panel as well as serum free light chains.  I will see the patient back in 2 weeks time to discuss the results of her blood work and further management   Thank you for this kind referral and the opportunity to participate in the care of this patient   Visit Diagnosis 1. Anemia in chronic kidney disease, unspecified CKD stage     Dr. Randa Evens, MD, MPH [facility] Pager- [phone] 01/17/2017 11:37 AM

## 2017-01-18 LAB — KAPPA/LAMBDA LIGHT CHAINS
KAPPA FREE LGHT CHN: 61.2 mg/L — AB (ref 3.3–19.4)
KAPPA, LAMDA LIGHT CHAIN RATIO: 1.2 (ref 0.26–1.65)
Lambda free light chains: 51.1 mg/L — ABNORMAL HIGH (ref 5.7–26.3)

## 2017-01-18 LAB — HAPTOGLOBIN: Haptoglobin: 233 mg/dL — ABNORMAL HIGH (ref 34–200)

## 2017-01-19 DIAGNOSIS — I129 Hypertensive chronic kidney disease with stage 1 through stage 4 chronic kidney disease, or unspecified chronic kidney disease: Secondary | ICD-10-CM | POA: Diagnosis not present

## 2017-01-19 DIAGNOSIS — G8191 Hemiplegia, unspecified affecting right dominant side: Secondary | ICD-10-CM | POA: Diagnosis not present

## 2017-01-19 DIAGNOSIS — N2581 Secondary hyperparathyroidism of renal origin: Secondary | ICD-10-CM | POA: Diagnosis not present

## 2017-01-19 DIAGNOSIS — E785 Hyperlipidemia, unspecified: Secondary | ICD-10-CM | POA: Diagnosis not present

## 2017-01-19 DIAGNOSIS — R5383 Other fatigue: Secondary | ICD-10-CM | POA: Diagnosis not present

## 2017-01-19 DIAGNOSIS — N184 Chronic kidney disease, stage 4 (severe): Secondary | ICD-10-CM | POA: Diagnosis not present

## 2017-01-19 DIAGNOSIS — E538 Deficiency of other specified B group vitamins: Secondary | ICD-10-CM | POA: Diagnosis not present

## 2017-01-19 DIAGNOSIS — D631 Anemia in chronic kidney disease: Secondary | ICD-10-CM | POA: Diagnosis not present

## 2017-01-19 DIAGNOSIS — D509 Iron deficiency anemia, unspecified: Secondary | ICD-10-CM | POA: Diagnosis not present

## 2017-01-19 DIAGNOSIS — G4730 Sleep apnea, unspecified: Secondary | ICD-10-CM | POA: Diagnosis not present

## 2017-01-21 ENCOUNTER — Other Ambulatory Visit: Payer: Self-pay

## 2017-01-21 DIAGNOSIS — N189 Chronic kidney disease, unspecified: Principal | ICD-10-CM

## 2017-01-21 DIAGNOSIS — D631 Anemia in chronic kidney disease: Secondary | ICD-10-CM

## 2017-01-22 LAB — MULTIPLE MYELOMA PANEL, SERUM
ALBUMIN/GLOB SERPL: 1.3 (ref 0.7–1.7)
ALPHA2 GLOB SERPL ELPH-MCNC: 0.9 g/dL (ref 0.4–1.0)
Albumin SerPl Elph-Mcnc: 3.3 g/dL (ref 2.9–4.4)
Alpha 1: 0.3 g/dL (ref 0.0–0.4)
B-Globulin SerPl Elph-Mcnc: 0.9 g/dL (ref 0.7–1.3)
GAMMA GLOB SERPL ELPH-MCNC: 0.6 g/dL (ref 0.4–1.8)
GLOBULIN, TOTAL: 2.7 g/dL (ref 2.2–3.9)
IGG (IMMUNOGLOBIN G), SERUM: 794 mg/dL (ref 700–1600)
IgA: 100 mg/dL (ref 64–422)
IgM (Immunoglobulin M), Srm: 50 mg/dL (ref 26–217)
Total Protein ELP: 6 g/dL (ref 6.0–8.5)

## 2017-01-23 LAB — UIFE/LIGHT CHAINS/TP QN, 24-HR UR
% BETA, Urine: 15.3 %
ALBUMIN, U: 67 %
ALPHA 1 URINE: 5.3 %
Alpha 2, Urine: 6 %
Free Kappa Lt Chains,Ur: 166 mg/L — ABNORMAL HIGH (ref 1.35–24.19)
Free Kappa/Lambda Ratio: 12.21 — ABNORMAL HIGH (ref 2.04–10.37)
Free Lambda Lt Chains,Ur: 13.6 mg/L — ABNORMAL HIGH (ref 0.24–6.66)
GAMMA GLOBULIN URINE: 6.5 %
TOTAL PROTEIN, URINE-UPE24: 49.1 mg/dL
TOTAL VOLUME: 800
Total Protein, Urine-Ur/day: 393 mg/24 hr — ABNORMAL HIGH (ref 30–150)

## 2017-01-25 DIAGNOSIS — H35423 Microcystoid degeneration of retina, bilateral: Secondary | ICD-10-CM | POA: Diagnosis not present

## 2017-01-25 DIAGNOSIS — H43823 Vitreomacular adhesion, bilateral: Secondary | ICD-10-CM | POA: Diagnosis not present

## 2017-01-25 DIAGNOSIS — E113213 Type 2 diabetes mellitus with mild nonproliferative diabetic retinopathy with macular edema, bilateral: Secondary | ICD-10-CM | POA: Diagnosis not present

## 2017-01-28 ENCOUNTER — Encounter: Payer: Self-pay | Admitting: Oncology

## 2017-01-28 ENCOUNTER — Inpatient Hospital Stay (HOSPITAL_BASED_OUTPATIENT_CLINIC_OR_DEPARTMENT_OTHER): Payer: Medicare HMO | Admitting: Oncology

## 2017-01-28 ENCOUNTER — Other Ambulatory Visit: Payer: Self-pay | Admitting: Oncology

## 2017-01-28 ENCOUNTER — Inpatient Hospital Stay: Payer: Medicare HMO

## 2017-01-28 VITALS — BP 151/49 | HR 75 | Temp 97.2°F | Resp 24

## 2017-01-28 VITALS — BP 135/57 | HR 65

## 2017-01-28 DIAGNOSIS — N184 Chronic kidney disease, stage 4 (severe): Secondary | ICD-10-CM | POA: Diagnosis not present

## 2017-01-28 DIAGNOSIS — D509 Iron deficiency anemia, unspecified: Secondary | ICD-10-CM

## 2017-01-28 DIAGNOSIS — G8191 Hemiplegia, unspecified affecting right dominant side: Secondary | ICD-10-CM | POA: Diagnosis not present

## 2017-01-28 DIAGNOSIS — Z9049 Acquired absence of other specified parts of digestive tract: Secondary | ICD-10-CM

## 2017-01-28 DIAGNOSIS — M810 Age-related osteoporosis without current pathological fracture: Secondary | ICD-10-CM

## 2017-01-28 DIAGNOSIS — F329 Major depressive disorder, single episode, unspecified: Secondary | ICD-10-CM | POA: Diagnosis not present

## 2017-01-28 DIAGNOSIS — D631 Anemia in chronic kidney disease: Secondary | ICD-10-CM

## 2017-01-28 DIAGNOSIS — I2510 Atherosclerotic heart disease of native coronary artery without angina pectoris: Secondary | ICD-10-CM

## 2017-01-28 DIAGNOSIS — E538 Deficiency of other specified B group vitamins: Secondary | ICD-10-CM | POA: Diagnosis not present

## 2017-01-28 DIAGNOSIS — E785 Hyperlipidemia, unspecified: Secondary | ICD-10-CM

## 2017-01-28 DIAGNOSIS — N2581 Secondary hyperparathyroidism of renal origin: Secondary | ICD-10-CM

## 2017-01-28 DIAGNOSIS — Z79899 Other long term (current) drug therapy: Secondary | ICD-10-CM

## 2017-01-28 DIAGNOSIS — Z7982 Long term (current) use of aspirin: Secondary | ICD-10-CM

## 2017-01-28 DIAGNOSIS — Z87891 Personal history of nicotine dependence: Secondary | ICD-10-CM

## 2017-01-28 DIAGNOSIS — Z8673 Personal history of transient ischemic attack (TIA), and cerebral infarction without residual deficits: Secondary | ICD-10-CM

## 2017-01-28 DIAGNOSIS — I129 Hypertensive chronic kidney disease with stage 1 through stage 4 chronic kidney disease, or unspecified chronic kidney disease: Secondary | ICD-10-CM

## 2017-01-28 DIAGNOSIS — Z90710 Acquired absence of both cervix and uterus: Secondary | ICD-10-CM

## 2017-01-28 DIAGNOSIS — E1121 Type 2 diabetes mellitus with diabetic nephropathy: Secondary | ICD-10-CM | POA: Diagnosis not present

## 2017-01-28 DIAGNOSIS — R5383 Other fatigue: Secondary | ICD-10-CM

## 2017-01-28 DIAGNOSIS — N189 Chronic kidney disease, unspecified: Secondary | ICD-10-CM

## 2017-01-28 DIAGNOSIS — M109 Gout, unspecified: Secondary | ICD-10-CM

## 2017-01-28 DIAGNOSIS — M419 Scoliosis, unspecified: Secondary | ICD-10-CM

## 2017-01-28 DIAGNOSIS — E1122 Type 2 diabetes mellitus with diabetic chronic kidney disease: Secondary | ICD-10-CM

## 2017-01-28 DIAGNOSIS — G4730 Sleep apnea, unspecified: Secondary | ICD-10-CM

## 2017-01-28 DIAGNOSIS — Z862 Personal history of diseases of the blood and blood-forming organs and certain disorders involving the immune mechanism: Secondary | ICD-10-CM

## 2017-01-28 MED ORDER — CYANOCOBALAMIN 1000 MCG/ML IJ SOLN
1000.0000 ug | INTRAMUSCULAR | Status: DC
  Administered 2017-01-28: 1000 ug via INTRAMUSCULAR
  Filled 2017-01-28: qty 1

## 2017-01-28 MED ORDER — VITAMIN B-12 1000 MCG PO TABS: 1000.0000 ug | ORAL_TABLET | Freq: Every day | ORAL | 6 refills | Status: DC

## 2017-01-28 MED ORDER — SODIUM CHLORIDE 0.9 % IV SOLN
510.0000 mg | Freq: Once | INTRAVENOUS | Status: AC
  Administered 2017-01-28: 510 mg via INTRAVENOUS
  Filled 2017-01-28: qty 17

## 2017-01-28 MED ORDER — SODIUM CHLORIDE 0.9 % IV SOLN
Freq: Once | INTRAVENOUS | Status: AC
  Administered 2017-01-28: 11:00:00 via INTRAVENOUS
  Filled 2017-01-28: qty 1000

## 2017-01-28 NOTE — Patient Instructions (Signed)
Sent in rx for vitamin b12 orally once a day ( 1000mg ).  Drugstore was [facility] in [place]

## 2017-01-28 NOTE — Progress Notes (Signed)
Hematology/Oncology Consult note [facility]  [phone] [phone]  Patient Care Team: Crecencio Mc, MD as PCP - General (Internal Medicine) Birdie Sons, MD as Referring Physician (Family Medicine) Bary Castilla Forest Gleason, MD (General Surgery)   Name of the patient: Candace Monroe  [id number]  1940/09/23   Date of visit: 01/28/17  Diagnosis- normocytic anemia- multifactorial secondary to iron and B12 deficiency as well as anemia of chronic kidney disease.   Chief complaint/ Reason for visit- discuss blood work results  Heme/Onc history: patient is a 77 year old female with past medical history significant for hypertension, hyperlipidemia, stage IV CKD, secondary hyperparathyroidism, no other medical problems.  She has been referred to [place] for evaluation and management of anemia.  She also has a history of stroke 3 years ago with some residual right-sided hemiparesis for which she uses a walker to ambulate.  Recent blood work checked at the nephrology office revealed a BUN of 27 and creatinine of 1.8.  CBC showed white count of 7.4, H&H of 9.7/31 with an MCV of 85 and a platelet count of 291.  Random protein electrophoresis did not reveal any evidence of M spike.  Liver function tests were within normal limits.  Patient reports mild chronic fatigue.  Denies any blood loss in stool or urine.  Denies any routine use of NSAIDs.  She is able to perform her ADLs but needs assistance with IADLs  Anemia workup from 01/17/2017 was as follows: CBC showed white count of 8, H&H of 9.6/29.8 with an MCV of 83.1 and a platelet count of 222.  CMP was significant for elevated BUN of 27 creatinine 1.6.  Iron study showed a low serum iron of 20 and iron saturation of 5%.  TIBC was normal at 410.  Ferritin was low at 11.  B12 was low normal at 268.  Reticulocyte count was low at 1.7 and haptoglobin was elevated to 33.  Multiple myeloma panel did not reveal any  monoclonal protein.  Both kappa and lambda light chains were elevated in the serum with a normal kappa lambda light chain ratio.  Random urine immunofixation was normal.   Interval history- feels fatigued. Has problems with her mobility  ECOG PS- 2 Pain scale- 0   Review of systems- Review of Systems  Constitutional: Positive for malaise/fatigue. Negative for chills, fever and weight loss.  HENT: Negative for congestion, ear discharge and nosebleeds.   Eyes: Negative for blurred vision.  Respiratory: Negative for cough, hemoptysis, sputum production, shortness of breath and wheezing.   Cardiovascular: Negative for chest pain, palpitations, orthopnea and claudication.  Gastrointestinal: Negative for abdominal pain, blood in stool, constipation, diarrhea, heartburn, melena, nausea and vomiting.  Genitourinary: Negative for dysuria, flank pain, frequency, hematuria and urgency.  Musculoskeletal: Negative for back pain, joint pain and myalgias.  Skin: Negative for rash.  Neurological: Negative for dizziness, tingling, focal weakness, seizures, weakness and headaches.  Endo/Heme/Allergies: Does not bruise/bleed easily.  Psychiatric/Behavioral: Negative for depression and suicidal ideas. The patient does not have insomnia.      Allergies  Allergen Reactions  . Ace Inhibitors     Renal insufficiency  . Augmentin [Amoxicillin-Pot Clavulanate] Itching  . Sulfa Antibiotics Itching     Past Medical History:  Diagnosis Date  . Anemia   . Arthritis   . Breast mass   . Chronic kidney disease   . Depression   . Diabetes mellitus without complication ([place])   . Diverticulitis   .  HTN (hypertension)   . Hyperlipidemia   . Stroke ([place]) Sept-Oct 2015  . Urine incontinence      Past Surgical History:  Procedure Laterality Date  . ABDOMINAL HYSTERECTOMY  1973  . CHOLECYSTECTOMY  1990  . TONSILLECTOMY      Social History   Socioeconomic History  . Marital status: Married     Spouse name: Not on file  . Number of children: Not on file  . Years of education: Not on file  . Highest education level: Not on file  Social Needs  . Financial resource strain: Not on file  . Food insecurity - worry: Not on file  . Food insecurity - inability: Not on file  . Transportation needs - medical: Not on file  . Transportation needs - non-medical: Not on file  Occupational History  . Not on file  Tobacco Use  . Smoking status: Former Smoker    Years: 50.00    Last attempt to quit: 09/01/2013    Years since quitting: 3.4  . Smokeless tobacco: Never Used  Substance and Sexual Activity  . Alcohol use: No    Alcohol/week: 0.0 oz  . Drug use: No  . Sexual activity: No  Other Topics Concern  . Not on file  Social History Narrative  . Not on file    Family History  Problem Relation Age of Onset  . Stroke Mother   . Cancer Sister 30       breast  . Diabetes Brother   . Seizures Sister      Current Outpatient Medications:  .  amitriptyline (ELAVIL) 10 MG tablet, TAKE 1 TABLET BY MOUTH AT BEDTIME AS NEEDED FOR INSOMNIA, Disp: 90 tablet, Rfl: 1 .  amLODipine (NORVASC) 10 MG tablet, TAKE 1 TABLET BY MOUTH EVERY DAY, Disp: 90 tablet, Rfl: 3 .  aspirin 81 MG tablet, Take 81 mg by mouth daily., Disp: , Rfl:  .  atorvastatin (LIPITOR) 20 MG tablet, TAKE 1 TABLET BY MOUTH EVERY DAY, Disp: 90 tablet, Rfl: 1 .  blood glucose meter kit and supplies, Please dispense One Touch meter, E11.22, Disp: 1 each, Rfl: 0 .  doxazosin (CARDURA) 2 MG tablet, Take 1 tablet (2 mg total) by mouth daily., Disp: 30 tablet, Rfl: 2 .  famotidine (PEPCID) 20 MG tablet, Take 1 tablet (20 mg total) by mouth daily., Disp: 90 tablet, Rfl: 3 .  fexofenadine (ALLEGRA) 180 MG tablet, Take 1 tablet (180 mg total) by mouth daily., Disp: 30 tablet, Rfl: 5 .  fluticasone (FLONASE) 50 MCG/ACT nasal spray, Place 1 spray into both nostrils daily., Disp: , Rfl:  .  glucose blood test strip, Use as instructed, one  touch to check blood glucose up to 3 times a day. E11.22, Disp: 100 each, Rfl: 12 .  Lancets (FREESTYLE) lancets, USE AS DIRECTED ONCE DAILY, Disp: 100 each, Rfl: 0 .  losartan (COZAAR) 100 MG tablet, Take 1 tablet (100 mg total) by mouth daily., Disp: 90 tablet, Rfl: 0 .  senna-docusate (SENOKOT-S) 8.6-50 MG per tablet, Take 2 tablets by mouth at bedtime., Disp: 60 tablet, Rfl: 0 .  sodium bicarbonate 650 MG tablet, Take 1 tablet (650 mg total) by mouth 2 (two) times daily., Disp: 180 tablet, Rfl: 1 .  traMADol (ULTRAM) 50 MG tablet, TAKE 1 TABLET BY MOUTH EVERY 6 HOURS AS NEEDED FOR MODERATE PAIN, Disp: 30 tablet, Rfl: 4 .  vitamin B-12 (CYANOCOBALAMIN) 1000 MCG tablet, Take 1 tablet (1,000 mcg total) by mouth daily.,  Disp: 30 tablet, Rfl: 6 No current facility-administered medications for this visit.   Facility-Administered Medications Ordered in Other Visits:  .  cyanocobalamin ((VITAMIN B-12)) injection 1,000 mcg, 1,000 mcg, Intramuscular, Q30 days, Sindy Guadeloupe, MD, 1,000 mcg at 01/28/17 1059  Physical exam:  Vitals:   01/28/17 1013  BP: (!) 151/49  Pulse: 75  Resp: (!) 24  Temp: (!) 97.2 F (36.2 C)  TempSrc: Tympanic  SpO2: 100%   Physical Exam  Constitutional: She is oriented to person, place, and time.  Elderly female who ambulates with a walker  HENT:  Head: Normocephalic and atraumatic.  Eyes: EOM are normal. Pupils are equal, round, and reactive to light.  Neck: Normal range of motion.  Cardiovascular: Normal rate, regular rhythm and normal heart sounds.  Pulmonary/Chest: Effort normal and breath sounds normal.  Abdominal: Soft. Bowel sounds are normal.  Neurological: She is alert and oriented to person, place, and time.  Skin: Skin is warm and dry.     CMP Latest Ref Rng & Units 01/17/2017  Glucose 65 - 99 mg/dL 113(H)  BUN 6 - 20 mg/dL 27(H)  Creatinine 0.44 - 1.00 mg/dL 1.64(H)  Sodium 135 - 145 mmol/L 143  Potassium 3.5 - 5.1 mmol/L 4.3  Chloride 101 - 111  mmol/L 110  CO2 22 - 32 mmol/L 25  Calcium 8.9 - 10.3 mg/dL 8.9  Total Protein 6.5 - 8.1 g/dL 6.5  Total Bilirubin 0.3 - 1.2 mg/dL 0.5  Alkaline Phos 38 - 126 U/L 68  AST 15 - 41 U/L 19  ALT 14 - 54 U/L 17   CBC Latest Ref Rng & Units 01/17/2017  WBC 3.6 - 11.0 K/uL 8.0  Hemoglobin 12.0 - 16.0 g/dL 9.6(L)  Hematocrit 35.0 - 47.0 % 29.8(L)  Platelets 150 - 440 K/uL 222     Assessment and plan- Patient is a 77 y.o. female with normocytic anemia- multifactorial secondary to iron and B12 deficiency as well as anemia of chronic kidney disease.   I discussed the results of the workup for anemia with the patient in detail.  Anemia workup does reveal evidence of iron deficiency along with low normal B12 levels in addition to chronic kidney disease.  With regards to her iron deficiency I recommended 2 doses of Feraheme 510 mg given IV weekly.  Discussed risks and benefits of Feraheme including all but not limited to headaches, leg swelling and risk of infusion reactions.  Patient understands and agrees to proceed.  I did recommend GI evaluation for workup of her iron deficiency anemia.  Patient would like to think about it and get back to me during her next visit.  Patient's B12 levels were low normal at 268.  She will start taking oral B12 thousand micrograms p.o. Daily.  Partly her anemia is also due to chronic kidney disease but I suspect that her hemoglobin should have some improvement with iron and B12 supplementation.  I will therefore hold off on EPO at this time  I will see her back in 2 months time with repeat CBC ferritin iron studies and B12 levels   Visit Diagnosis 1. Iron deficiency anemia, unspecified iron deficiency anemia type   2. B12 deficiency   3. History of anemia due to chronic kidney disease      Dr. Randa Evens, MD, MPH [facility] Pager- [phone] 01/28/2017 12:10 PM

## 2017-02-01 ENCOUNTER — Other Ambulatory Visit: Payer: Self-pay | Admitting: Internal Medicine

## 2017-02-04 ENCOUNTER — Ambulatory Visit: Payer: Self-pay

## 2017-02-05 ENCOUNTER — Inpatient Hospital Stay: Payer: Medicare HMO | Attending: Oncology

## 2017-02-05 ENCOUNTER — Telehealth: Payer: Self-pay | Admitting: *Deleted

## 2017-02-05 VITALS — BP 138/67 | HR 73 | Temp 97.5°F | Resp 20

## 2017-02-05 DIAGNOSIS — D509 Iron deficiency anemia, unspecified: Secondary | ICD-10-CM

## 2017-02-05 DIAGNOSIS — Z79899 Other long term (current) drug therapy: Secondary | ICD-10-CM | POA: Diagnosis not present

## 2017-02-05 MED ORDER — SODIUM CHLORIDE 0.9 % IV SOLN
Freq: Once | INTRAVENOUS | Status: AC
  Administered 2017-02-05: 10:00:00 via INTRAVENOUS
  Filled 2017-02-05: qty 1000

## 2017-02-05 MED ORDER — FERUMOXYTOL INJECTION 510 MG/17 ML
INTRAVENOUS | Status: AC
Start: 2017-02-05 — End: ?
  Filled 2017-02-05: qty 17

## 2017-02-05 MED ORDER — SODIUM CHLORIDE 0.9 % IV SOLN
510.0000 mg | Freq: Once | INTRAVENOUS | Status: AC
  Administered 2017-02-05: 510 mg via INTRAVENOUS
  Filled 2017-02-05: qty 17

## 2017-02-05 NOTE — Telephone Encounter (Signed)
-----   Message from Secundino Ginger sent at 02/05/2017  3:43 PM EST ----- Contact: [phone] Daughter Candace Monroe came today with her mom for iron infusion. She wanted to know why she was getting iron, like her official diagnosis and to talk to DR. I explained Dr. Janese Banks is on [place] until Thursday. Can you call her and let her know diagnosis.

## 2017-02-05 NOTE — Patient Instructions (Signed)

## 2017-02-05 NOTE — Telephone Encounter (Signed)
daugter called to check on pt because she comes to [facility] and thought that her parents had not heard clearly why she was coming because she was never told she has cancer. I explained that we see hemotology patients and she has anemia.  Her mom did tell her that she gets iron treatments but daughter did not know we see hematology patients.

## 2017-02-07 ENCOUNTER — Other Ambulatory Visit: Payer: Self-pay | Admitting: Internal Medicine

## 2017-03-18 ENCOUNTER — Other Ambulatory Visit: Payer: Self-pay | Admitting: Internal Medicine

## 2017-03-25 ENCOUNTER — Ambulatory Visit (INDEPENDENT_AMBULATORY_CARE_PROVIDER_SITE_OTHER): Payer: Medicare HMO

## 2017-03-25 VITALS — BP 128/72 | HR 75 | Temp 98.3°F | Resp 14 | Ht 58.5 in | Wt 131.0 lb

## 2017-03-25 DIAGNOSIS — Z23 Encounter for immunization: Secondary | ICD-10-CM | POA: Diagnosis not present

## 2017-03-25 DIAGNOSIS — Z0000 Encounter for general adult medical examination without abnormal findings: Secondary | ICD-10-CM | POA: Diagnosis not present

## 2017-03-25 NOTE — Patient Instructions (Addendum)
  Ms. Tangen , Thank you for taking time to come for your Medicare Wellness Visit. I appreciate your ongoing commitment to your health goals. Please review the following plan we discussed and let me know if I can assist you in the future.   Follow up with Dr. Derrel Nip as needed.    Bring a copy of your Wellman and/or Living Will to be scanned into chart.  Have a great day!  These are the goals we discussed: Goals    . Increase physical activity     Walk for exercise, as tolerated       This is a list of the screening recommended for you and due dates:  Health Maintenance  Topic Date Due  . Tetanus Vaccine  04/15/1957  . Pneumonia vaccines (2 of 2 - PCV13) 05/30/2016  . Eye exam for diabetics  12/13/2016  . Hemoglobin A1C  06/20/2017  . Complete foot exam   12/20/2017  . Flu Shot  Completed  . DEXA scan (bone density measurement)  Completed

## 2017-03-25 NOTE — Progress Notes (Signed)
Subjective:   Candace Monroe is a 77 y.o. female who presents for Medicare Annual (Subsequent) preventive examination.  Review of Systems:  No ROS.  Medicare Wellness Visit. Additional risk factors are reflected in the social history.     Objective:     Vitals: BP 128/72 (BP Location: Left Arm, Patient Position: Sitting, Cuff Size: Normal)   Pulse 75   Temp 98.3 F (36.8 C) (Oral)   Resp 14   Ht 4' 10.5" (1.486 m)   Wt 131 lb (59.4 kg)   SpO2 96%   BMI 26.91 kg/m   Body mass index is 26.91 kg/m.  Advanced Directives 03/25/2017 01/28/2017 01/17/2017 01/17/2017 03/22/2016 03/07/2016 10/02/2013  Does Patient Have a Medical Advance Directive? Yes No No Yes No No No  Type of Advance Directive Living will - - - - - -  Does patient want to make changes to medical advance directive? No - Patient declined - - - - - -  Would patient like information on creating a medical advance directive? - - - - No - Patient declined No - Patient declined Yes - Scientist, clinical (histocompatibility and immunogenetics) given    Tobacco Social History   Tobacco Use  Smoking Status Former Smoker  . Years: 50.00  . Last attempt to quit: 09/01/2013  . Years since quitting: 3.5  Smokeless Tobacco Never Used     Counseling given: Not Answered   Clinical Intake:  Pre-visit preparation completed: Yes  Pain : No/denies pain     Nutritional Status: BMI of 19-24  Normal Diabetes: Yes(Followed by PCP)  How often do you need to have someone help you when you read instructions, pamphlets, or other written materials from your doctor or pharmacy?: 1 - Never  Interpreter Needed?: No     Past Medical History:  Diagnosis Date  . Anemia   . Arthritis   . Breast mass   . Chronic kidney disease   . Depression   . Diabetes mellitus without complication ([place])   . Diverticulitis   . HTN (hypertension)   . Hyperlipidemia   . Stroke [place]) Sept-Oct 2015  . Urine incontinence    Past Surgical History:  Procedure Laterality Date  .  ABDOMINAL HYSTERECTOMY  1973  . CHOLECYSTECTOMY  1990  . TONSILLECTOMY     Family History  Problem Relation Age of Onset  . Stroke Mother   . Cancer Sister 5       breast  . Diabetes Brother   . Seizures Sister   . Diabetes Son    Social History   Socioeconomic History  . Marital status: Married    Spouse name: Not on file  . Number of children: Not on file  . Years of education: Not on file  . Highest education level: Not on file  Occupational History  . Not on file  Social Needs  . Financial resource strain: Not hard at all  . Food insecurity:    Worry: Never true    Inability: Never true  . Transportation needs:    Medical: No    Non-medical: No  Tobacco Use  . Smoking status: Former Smoker    Years: 50.00    Last attempt to quit: 09/01/2013    Years since quitting: 3.5  . Smokeless tobacco: Never Used  Substance and Sexual Activity  . Alcohol use: No    Alcohol/week: 0.0 oz  . Drug use: No  . Sexual activity: Never  Lifestyle  . Physical activity:  Days per week: Not on file    Minutes per session: Not on file  . Stress: Not on file  Relationships  . Social connections:    Talks on phone: Not on file    Gets together: Not on file    Attends religious service: Not on file    Active member of club or organization: Not on file    Attends meetings of clubs or organizations: Not on file    Relationship status: Not on file  Other Topics Concern  . Not on file  Social History Narrative  . Not on file    Outpatient Encounter Medications as of 03/25/2017  Medication Sig  . amitriptyline (ELAVIL) 10 MG tablet TAKE 1 TABLET BY MOUTH AT BEDTIME AS NEEDED FOR INSOMNIA  . amLODipine (NORVASC) 10 MG tablet TAKE 1 TABLET BY MOUTH EVERY DAY  . aspirin 81 MG tablet Take 81 mg by mouth daily.  Marland Kitchen atorvastatin (LIPITOR) 20 MG tablet TAKE 1 TABLET BY MOUTH EVERY DAY  . blood glucose meter kit and supplies Please dispense One Touch meter, E11.22  . doxazosin (CARDURA)  2 MG tablet TAKE 1 TABLET BY MOUTH EVERY DAY  . famotidine (PEPCID) 20 MG tablet TAKE 1 TABLET (20 MG TOTAL) BY MOUTH DAILY.  . fexofenadine (ALLEGRA) 180 MG tablet Take 1 tablet (180 mg total) by mouth daily.  . fluticasone (FLONASE) 50 MCG/ACT nasal spray Place 1 spray into both nostrils daily.  Marland Kitchen glucose blood test strip Use as instructed, one touch to check blood glucose up to 3 times a day. E11.22  . Lancets (FREESTYLE) lancets USE AS DIRECTED ONCE DAILY  . losartan (COZAAR) 100 MG tablet Take 1 tablet (100 mg total) by mouth daily.  Marland Kitchen senna-docusate (SENOKOT-S) 8.6-50 MG per tablet Take 2 tablets by mouth at bedtime.  . sodium bicarbonate 650 MG tablet Take 1 tablet (650 mg total) by mouth 2 (two) times daily.  . traMADol (ULTRAM) 50 MG tablet TAKE 1 TABLET BY MOUTH EVERY 6 HOURS AS NEEDED FOR MODERATE PAIN  . vitamin B-12 (CYANOCOBALAMIN) 1000 MCG tablet Take 1 tablet (1,000 mcg total) by mouth daily.  . [DISCONTINUED] amLODipine (NORVASC) 10 MG tablet TAKE 1 TABLET BY MOUTH EVERY DAY   No facility-administered encounter medications on file as of 03/25/2017.     Activities of Daily Living In your present state of health, do you have any difficulty performing the following activities: 03/25/2017  Hearing? N  Vision? N  Walking or climbing stairs? Y  Comment Unsteady gait  Dressing or bathing? N  Doing errands, shopping? N  Preparing Food and eating ? N  Using the Toilet? N  In the past six months, have you accidently leaked urine? Y  Comment Managed with daily pad  Do you have problems with loss of bowel control? N  Managing your Medications? N  Managing your Finances? Y  Housekeeping or managing your Housekeeping? Y  Some recent data might be hidden    Patient Care Team: Crecencio Mc, MD as PCP - General (Internal Medicine) Birdie Sons, MD as Referring Physician (Family Medicine) Bary Castilla Forest Gleason, MD (General Surgery)    Assessment:   This is a routine wellness  examination for Candace Monroe.  The goal of the wellness visit is to assist the patient how to close the gaps in care and create a preventative care plan for the patient.   The roster of all physicians providing medical care to patient is listed in the Snapshot  section of the chart.  Osteoporosis reviewed.    Safety issues reviewed; Smoke and carbon monoxide detectors in the home. No firearms in the home. Wears seatbelts when riding with others. No violence in the home.  They do not have excessive sun exposure.  Discussed the need for sun protection: hats, long sleeves and the use of sunscreen if there is significant sun exposure.  Patient is alert, normal appearance, oriented to person/place/and time.  Correctly identified the president of the [place] and recalls of 2/3 words. Can read correct time from watch face. Displays appropriate judgement.  No new identified risk were noted.  No failures at ADL's or IADL's.  Ambulates with rollator walker.  BMI- discussed the importance of a healthy diet, water intake and the benefits of aerobic exercise. Educational material provided.   24 hour diet recall: Regular diet, portion controlled.  Dental- dentures.  Eye- Visual acuity not assessed per patient preference since they have regular follow up with the ophthalmologist.  Wears corrective lenses when reading.    Sleep patterns- Sleeps through the night without issues.   Prevnar 13 vaccine administered L deltoid. Tolerated well.  Educational material provided.   TDAP vaccine deferred per patient preference.  Follow up with insurance.  Educational material provided.  Patient Concerns: None at this time. Follow up with PCP as needed.  Exercise Activities and Dietary recommendations Current Exercise Habits: Home exercise routine, Type of exercise: walking(Chair exercises ), Intensity: Mild  Goals    . Increase physical activity     Walk for exercise, as tolerated       Fall Risk Fall  Risk  03/25/2017 03/22/2016 12/01/2014 09/29/2014  Falls in the past year? No No Yes Yes  Number falls in past yr: - - 1 1  Injury with Fall? - - No No  Risk for fall due to : - - Impaired mobility Other (Comment);Impaired mobility  Risk for fall due to: Comment - - - Tripped over walker  Follow up - - Falls prevention discussed Falls prevention discussed   Depression Screen PHQ 2/9 Scores 03/25/2017 03/22/2016 12/01/2014 09/29/2014  PHQ - 2 Score 0 0 0 0     Cognitive Function     6CIT Screen 03/22/2016  What Year? 0 points  What month? 0 points  What time? 0 points  Count back from 20 0 points  Months in reverse 0 points  Repeat phrase 0 points  Total Score 0    Immunization History  Administered Date(s) Administered  . Influenza, High Dose Seasonal PF 09/29/2014  . Influenza-Unspecified 10/01/2013, 10/12/2015, 09/25/2016  . Pneumococcal Conjugate-13 03/25/2017  . Pneumococcal Polysaccharide-23 05/31/2015    Screening Tests Health Maintenance  Topic Date Due  . TETANUS/TDAP  04/15/1957  . PNA vac Low Risk Adult (2 of 2 - PCV13) 05/30/2016  . OPHTHALMOLOGY EXAM  12/13/2016  . HEMOGLOBIN A1C  06/20/2017  . FOOT EXAM  12/20/2017  . INFLUENZA VACCINE  Completed  . DEXA SCAN  Completed      Plan:    End of life planning; Advance aging; Advanced directives discussed. Copy of current HCPOA/Living Will requested.    I have personally reviewed and noted the following in the patient's chart:   . Medical and social history . Use of alcohol, tobacco or illicit drugs  . Current medications and supplements . Functional ability and status . Nutritional status . Physical activity . Advanced directives . List of other physicians . Hospitalizations, surgeries, and ER visits in previous 24  months . Vitals . Screenings to include cognitive, depression, and falls . Referrals and appointments  In addition, I have reviewed and discussed with patient certain preventive protocols,  quality metrics, and best practice recommendations. A written personalized care plan for preventive services as well as general preventive health recommendations were provided to patient.     Varney Biles, LPN  1/69/6789

## 2017-03-29 ENCOUNTER — Inpatient Hospital Stay: Payer: Medicare HMO | Attending: Oncology

## 2017-03-29 DIAGNOSIS — D509 Iron deficiency anemia, unspecified: Secondary | ICD-10-CM | POA: Diagnosis not present

## 2017-03-29 LAB — CBC
HCT: 37.5 % (ref 35.0–47.0)
Hemoglobin: 12.8 g/dL (ref 12.0–16.0)
MCH: 30.4 pg (ref 26.0–34.0)
MCHC: 34.1 g/dL (ref 32.0–36.0)
MCV: 89.3 fL (ref 80.0–100.0)
Platelets: 211 10*3/uL (ref 150–440)
RBC: 4.19 MIL/uL (ref 3.80–5.20)
RDW: 17.3 % — AB (ref 11.5–14.5)
WBC: 7.1 10*3/uL (ref 3.6–11.0)

## 2017-03-29 LAB — FERRITIN: FERRITIN: 190 ng/mL (ref 11–307)

## 2017-03-29 LAB — IRON AND TIBC
Iron: 69 ug/dL (ref 28–170)
SATURATION RATIOS: 28 % (ref 10.4–31.8)
TIBC: 247 ug/dL — ABNORMAL LOW (ref 250–450)
UIBC: 178 ug/dL

## 2017-03-29 LAB — VITAMIN B12: Vitamin B-12: 1754 pg/mL — ABNORMAL HIGH (ref 180–914)

## 2017-03-31 ENCOUNTER — Other Ambulatory Visit: Payer: Self-pay | Admitting: *Deleted

## 2017-03-31 DIAGNOSIS — D509 Iron deficiency anemia, unspecified: Secondary | ICD-10-CM

## 2017-03-31 DIAGNOSIS — D508 Other iron deficiency anemias: Secondary | ICD-10-CM

## 2017-03-31 DIAGNOSIS — E538 Deficiency of other specified B group vitamins: Secondary | ICD-10-CM

## 2017-04-01 ENCOUNTER — Inpatient Hospital Stay: Payer: Medicare HMO | Attending: Oncology | Admitting: Oncology

## 2017-04-01 ENCOUNTER — Encounter: Payer: Self-pay | Admitting: Oncology

## 2017-04-01 ENCOUNTER — Other Ambulatory Visit: Payer: Self-pay | Admitting: Internal Medicine

## 2017-04-01 ENCOUNTER — Inpatient Hospital Stay: Payer: Medicare HMO

## 2017-04-01 VITALS — BP 146/45 | HR 40 | Temp 97.1°F | Resp 18 | Ht <= 58 in | Wt 133.0 lb

## 2017-04-01 DIAGNOSIS — D509 Iron deficiency anemia, unspecified: Secondary | ICD-10-CM | POA: Diagnosis not present

## 2017-04-01 DIAGNOSIS — Z7982 Long term (current) use of aspirin: Secondary | ICD-10-CM | POA: Diagnosis not present

## 2017-04-01 DIAGNOSIS — Z79899 Other long term (current) drug therapy: Secondary | ICD-10-CM | POA: Diagnosis not present

## 2017-04-01 DIAGNOSIS — E538 Deficiency of other specified B group vitamins: Secondary | ICD-10-CM | POA: Diagnosis not present

## 2017-04-01 DIAGNOSIS — Z90710 Acquired absence of both cervix and uterus: Secondary | ICD-10-CM

## 2017-04-01 DIAGNOSIS — R5383 Other fatigue: Secondary | ICD-10-CM | POA: Insufficient documentation

## 2017-04-01 DIAGNOSIS — I69351 Hemiplegia and hemiparesis following cerebral infarction affecting right dominant side: Secondary | ICD-10-CM | POA: Diagnosis not present

## 2017-04-01 DIAGNOSIS — Z8719 Personal history of other diseases of the digestive system: Secondary | ICD-10-CM | POA: Diagnosis not present

## 2017-04-01 DIAGNOSIS — N184 Chronic kidney disease, stage 4 (severe): Secondary | ICD-10-CM | POA: Diagnosis not present

## 2017-04-01 DIAGNOSIS — Z803 Family history of malignant neoplasm of breast: Secondary | ICD-10-CM

## 2017-04-01 DIAGNOSIS — N2581 Secondary hyperparathyroidism of renal origin: Secondary | ICD-10-CM

## 2017-04-01 DIAGNOSIS — Z87891 Personal history of nicotine dependence: Secondary | ICD-10-CM

## 2017-04-01 DIAGNOSIS — I129 Hypertensive chronic kidney disease with stage 1 through stage 4 chronic kidney disease, or unspecified chronic kidney disease: Secondary | ICD-10-CM | POA: Diagnosis not present

## 2017-04-01 DIAGNOSIS — D649 Anemia, unspecified: Secondary | ICD-10-CM | POA: Diagnosis not present

## 2017-04-01 DIAGNOSIS — E785 Hyperlipidemia, unspecified: Secondary | ICD-10-CM | POA: Diagnosis not present

## 2017-04-01 DIAGNOSIS — R531 Weakness: Secondary | ICD-10-CM | POA: Insufficient documentation

## 2017-04-01 DIAGNOSIS — E119 Type 2 diabetes mellitus without complications: Secondary | ICD-10-CM | POA: Diagnosis not present

## 2017-04-01 DIAGNOSIS — D508 Other iron deficiency anemias: Secondary | ICD-10-CM

## 2017-04-01 DIAGNOSIS — Z8673 Personal history of transient ischemic attack (TIA), and cerebral infarction without residual deficits: Secondary | ICD-10-CM

## 2017-04-01 DIAGNOSIS — F329 Major depressive disorder, single episode, unspecified: Secondary | ICD-10-CM | POA: Insufficient documentation

## 2017-04-01 DIAGNOSIS — D631 Anemia in chronic kidney disease: Secondary | ICD-10-CM

## 2017-04-01 NOTE — Progress Notes (Signed)
No new changes noted today 

## 2017-04-01 NOTE — Progress Notes (Signed)
Hematology/Oncology Consult note [facility]  [phone] [phone]  Patient Care Team: Crecencio Mc, MD as PCP - General (Internal Medicine) Birdie Sons, MD as Referring Physician (Family Medicine) Bary Castilla Forest Gleason, MD (General Surgery)   Name of the patient: Candace Monroe  [id number]  [birth year]   Date of visit: 04/01/17  Diagnosis- normocytic anemia- multifactorial secondary to iron and B12 deficiency as well as anemia of chronic kidney disease.   Chief complaint/ Reason for visit- routine f/u of anemia  Heme/Onc history: patient is a 77 year old female with past medical history significant for hypertension, hyperlipidemia, stage IV CKD, secondary hyperparathyroidism, no other medical problems. She has been referred to [place] for evaluation and management of anemia. She also has a history of stroke 3 years ago with some residual right-sided hemiparesis for which she uses a walker to ambulate. Recent blood work checked at the nephrology office revealed a BUN of 27 and creatinine of 1.8. CBC showed white count of 7.4, H&H of 9.7/31 with an MCV of 85 and a platelet count of 291. Random protein electrophoresis did not reveal any evidence of M spike. Liver function tests were within normal limits. Patient reports mild chronic fatigue. Denies any blood loss in stool or urine. Denies any routine use of NSAIDs. She is able to perform her ADLs but needs assistance with IADLs  Anemia workup from 01/17/2017 was as follows: CBC showed white count of 8, H&H of 9.6/29.8 with an MCV of 83.1 and a platelet count of 222.  CMP was significant for elevated BUN of 27 creatinine 1.6.  Iron study showed a low serum iron of 20 and iron saturation of 5%.  TIBC was normal at 410.  Ferritin was low at 11.  B12 was low normal at 268.  Reticulocyte count was low at 1.7 and haptoglobin was elevated to 33.  Multiple myeloma panel did not reveal any monoclonal  protein.  Both kappa and lambda light chains were elevated in the serum with a normal kappa lambda light chain ratio.  Random urine immunofixation was normal.  Patient received 2 doses of feraheme in feb 2019. She also started taking po b12  Interval history- she reports feeling better in terms of her energy levels after receiving IV iron. She denies any blood in stool or urine  ECOG PS- 2 Pain scale- 0   Review of systems- Review of Systems  Constitutional: Positive for malaise/fatigue. Negative for chills, fever and weight loss.  HENT: Negative for congestion, ear discharge and nosebleeds.   Eyes: Negative for blurred vision.  Respiratory: Negative for cough, hemoptysis, sputum production, shortness of breath and wheezing.   Cardiovascular: Negative for chest pain, palpitations, orthopnea and claudication.  Gastrointestinal: Negative for abdominal pain, blood in stool, constipation, diarrhea, heartburn, melena, nausea and vomiting.  Genitourinary: Negative for dysuria, flank pain, frequency, hematuria and urgency.  Musculoskeletal: Negative for back pain, joint pain and myalgias.  Skin: Negative for rash.  Neurological: Negative for dizziness, tingling, focal weakness, seizures, weakness and headaches.  Endo/Heme/Allergies: Does not bruise/bleed easily.  Psychiatric/Behavioral: Negative for depression and suicidal ideas. The patient does not have insomnia.       Allergies  Allergen Reactions  . Ace Inhibitors     Renal insufficiency  . Augmentin [Amoxicillin-Pot Clavulanate] Itching  . Sulfa Antibiotics Itching     Past Medical History:  Diagnosis Date  . Anemia   . Arthritis   . Breast mass   . Chronic  kidney disease   . Depression   . Diabetes mellitus without complication ([place])   . Diverticulitis   . HTN (hypertension)   . Hyperlipidemia   . Stroke ([place]) Sept-Oct 2015  . Urine incontinence      Past Surgical History:  Procedure Laterality Date  . ABDOMINAL  HYSTERECTOMY  1973  . CHOLECYSTECTOMY  1990  . TONSILLECTOMY      Social History   Socioeconomic History  . Marital status: Married    Spouse name: Not on file  . Number of children: Not on file  . Years of education: Not on file  . Highest education level: Not on file  Occupational History  . Not on file  Social Needs  . Financial resource strain: Not hard at all  . Food insecurity:    Worry: Never true    Inability: Never true  . Transportation needs:    Medical: No    Non-medical: No  Tobacco Use  . Smoking status: Former Smoker    Years: 50.00    Last attempt to quit: 09/01/2013    Years since quitting: 3.5  . Smokeless tobacco: Never Used  Substance and Sexual Activity  . Alcohol use: No    Alcohol/week: 0.0 oz  . Drug use: No  . Sexual activity: Never  Lifestyle  . Physical activity:    Days per week: Not on file    Minutes per session: Not on file  . Stress: Not on file  Relationships  . Social connections:    Talks on phone: Not on file    Gets together: Not on file    Attends religious service: Not on file    Active member of club or organization: Not on file    Attends meetings of clubs or organizations: Not on file    Relationship status: Not on file  . Intimate partner violence:    Fear of current or ex partner: No    Emotionally abused: No    Physically abused: No    Forced sexual activity: No  Other Topics Concern  . Not on file  Social History Narrative  . Not on file    Family History  Problem Relation Age of Onset  . Stroke Mother   . Cancer Sister 45       breast  . Diabetes Brother   . Seizures Sister   . Diabetes Son      Current Outpatient Medications:  .  amitriptyline (ELAVIL) 10 MG tablet, TAKE 1 TABLET BY MOUTH AT BEDTIME AS NEEDED FOR INSOMNIA, Disp: 90 tablet, Rfl: 1 .  amLODipine (NORVASC) 10 MG tablet, TAKE 1 TABLET BY MOUTH EVERY DAY, Disp: 90 tablet, Rfl: 1 .  aspirin 81 MG tablet, Take 81 mg by mouth daily., Disp: ,  Rfl:  .  atorvastatin (LIPITOR) 20 MG tablet, TAKE 1 TABLET BY MOUTH EVERY DAY, Disp: 90 tablet, Rfl: 1 .  blood glucose meter kit and supplies, Please dispense One Touch meter, E11.22, Disp: 1 each, Rfl: 0 .  doxazosin (CARDURA) 2 MG tablet, TAKE 1 TABLET BY MOUTH EVERY DAY, Disp: 30 tablet, Rfl: 2 .  famotidine (PEPCID) 20 MG tablet, TAKE 1 TABLET (20 MG TOTAL) BY MOUTH DAILY., Disp: 90 tablet, Rfl: 1 .  fexofenadine (ALLEGRA) 180 MG tablet, Take 1 tablet (180 mg total) by mouth daily., Disp: 30 tablet, Rfl: 5 .  fluticasone (FLONASE) 50 MCG/ACT nasal spray, Place 1 spray into both nostrils daily., Disp: , Rfl:  .  glucose blood test strip, Use as instructed, one touch to check blood glucose up to 3 times a day. E11.22, Disp: 100 each, Rfl: 12 .  Lancets (FREESTYLE) lancets, USE AS DIRECTED ONCE DAILY, Disp: 100 each, Rfl: 0 .  losartan (COZAAR) 100 MG tablet, Take 1 tablet (100 mg total) by mouth daily., Disp: 90 tablet, Rfl: 0 .  senna-docusate (SENOKOT-S) 8.6-50 MG per tablet, Take 2 tablets by mouth at bedtime., Disp: 60 tablet, Rfl: 0 .  sodium bicarbonate 650 MG tablet, Take 1 tablet (650 mg total) by mouth 2 (two) times daily., Disp: 180 tablet, Rfl: 1 .  traMADol (ULTRAM) 50 MG tablet, TAKE 1 TABLET BY MOUTH EVERY 6 HOURS AS NEEDED FOR MODERATE PAIN, Disp: 30 tablet, Rfl: 4 .  vitamin B-12 (CYANOCOBALAMIN) 1000 MCG tablet, Take 1 tablet (1,000 mcg total) by mouth daily., Disp: 30 tablet, Rfl: 6  Physical exam:  Vitals:   04/01/17 1031  BP: (!) 146/45  Pulse: (!) 40  Resp: 18  Temp: (!) 97.1 F (36.2 C)  TempSrc: Tympanic  Weight: 133 lb 0.8 oz (60.3 kg)  Height: _0  (1.473 m)   Physical Exam  Constitutional: She is oriented to person, place, and time.  Frail elderly female who ambulates with a walker  HENT:  Head: Normocephalic and atraumatic.  Eyes: Pupils are equal, round, and reactive to light. EOM are normal.  Neck: Normal range of motion.  Cardiovascular: Normal  rate, regular rhythm and normal heart sounds.  Pulmonary/Chest: Effort normal and breath sounds normal.  Abdominal: Soft. Bowel sounds are normal.  Neurological: She is alert and oriented to person, place, and time.  Skin: Skin is warm and dry.     CMP Latest Ref Rng & Units 01/17/2017  Glucose 65 - 99 mg/dL 113(H)  BUN 6 - 20 mg/dL 27(H)  Creatinine 0.44 - 1.00 mg/dL 1.64(H)  Sodium 135 - 145 mmol/L 143  Potassium 3.5 - 5.1 mmol/L 4.3  Chloride 101 - 111 mmol/L 110  CO2 22 - 32 mmol/L 25  Calcium 8.9 - 10.3 mg/dL 8.9  Total Protein 6.5 - 8.1 g/dL 6.5  Total Bilirubin 0.3 - 1.2 mg/dL 0.5  Alkaline Phos 38 - 126 U/L 68  AST 15 - 41 U/L 19  ALT 14 - 54 U/L 17   CBC Latest Ref Rng & Units 03/29/2017  WBC 3.6 - 11.0 K/uL 7.1  Hemoglobin 12.0 - 16.0 g/dL 12.8  Hematocrit 35.0 - 47.0 % 37.5  Platelets 150 - 440 K/uL 211     Assessment and plan- Patient is a 77 y.o. female with normocytic anemia- multifactorial due to iron and b12 deficiency as well as anemia of chronic kidney disease  Patients hb has significantly improved from 9.6 to 12.8 today after receiving 2 doses of feraheme. She is also taking po b12. Iron studies from today are pending. She likely does not need IV iron at this time.  I again recommended that she should get GI evaluation for her IDA. She declines it at this point but will consider it in the future if she becomes iron deficient again  Repeat cbc ferritin and iron studies in 3 and 6 months and I will see her back in 6 months with b12 levels.   Visit Diagnosis 1. Other iron deficiency anemia      Dr. Randa Evens, MD, MPH [facility] at [facility] Pager- [phone] 04/01/2017 11:03 AM

## 2017-04-09 DIAGNOSIS — N184 Chronic kidney disease, stage 4 (severe): Secondary | ICD-10-CM | POA: Diagnosis not present

## 2017-04-09 DIAGNOSIS — R809 Proteinuria, unspecified: Secondary | ICD-10-CM | POA: Diagnosis not present

## 2017-04-09 DIAGNOSIS — D631 Anemia in chronic kidney disease: Secondary | ICD-10-CM | POA: Diagnosis not present

## 2017-04-09 DIAGNOSIS — N2581 Secondary hyperparathyroidism of renal origin: Secondary | ICD-10-CM | POA: Diagnosis not present

## 2017-04-09 DIAGNOSIS — I10 Essential (primary) hypertension: Secondary | ICD-10-CM | POA: Diagnosis not present

## 2017-04-11 DIAGNOSIS — N184 Chronic kidney disease, stage 4 (severe): Secondary | ICD-10-CM | POA: Diagnosis not present

## 2017-05-15 ENCOUNTER — Other Ambulatory Visit: Payer: Self-pay | Admitting: Internal Medicine

## 2017-06-04 ENCOUNTER — Other Ambulatory Visit: Payer: Self-pay | Admitting: Internal Medicine

## 2017-06-11 ENCOUNTER — Other Ambulatory Visit: Payer: Self-pay | Admitting: Internal Medicine

## 2017-06-21 ENCOUNTER — Ambulatory Visit (INDEPENDENT_AMBULATORY_CARE_PROVIDER_SITE_OTHER): Payer: Medicare HMO | Admitting: Internal Medicine

## 2017-06-21 ENCOUNTER — Encounter: Payer: Self-pay | Admitting: Internal Medicine

## 2017-06-21 VITALS — BP 124/58 | HR 84 | Temp 99.2°F | Resp 16 | Ht <= 58 in | Wt 129.0 lb

## 2017-06-21 DIAGNOSIS — R69 Illness, unspecified: Secondary | ICD-10-CM | POA: Diagnosis not present

## 2017-06-21 DIAGNOSIS — I63239 Cerebral infarction due to unspecified occlusion or stenosis of unspecified carotid arteries: Secondary | ICD-10-CM

## 2017-06-21 DIAGNOSIS — E785 Hyperlipidemia, unspecified: Secondary | ICD-10-CM

## 2017-06-21 DIAGNOSIS — I10 Essential (primary) hypertension: Secondary | ICD-10-CM

## 2017-06-21 DIAGNOSIS — H9193 Unspecified hearing loss, bilateral: Secondary | ICD-10-CM

## 2017-06-21 DIAGNOSIS — N182 Chronic kidney disease, stage 2 (mild): Secondary | ICD-10-CM | POA: Diagnosis not present

## 2017-06-21 DIAGNOSIS — D126 Benign neoplasm of colon, unspecified: Secondary | ICD-10-CM

## 2017-06-21 DIAGNOSIS — E559 Vitamin D deficiency, unspecified: Secondary | ICD-10-CM | POA: Diagnosis not present

## 2017-06-21 DIAGNOSIS — E1122 Type 2 diabetes mellitus with diabetic chronic kidney disease: Secondary | ICD-10-CM

## 2017-06-21 DIAGNOSIS — D509 Iron deficiency anemia, unspecified: Secondary | ICD-10-CM

## 2017-06-21 DIAGNOSIS — H9190 Unspecified hearing loss, unspecified ear: Secondary | ICD-10-CM | POA: Insufficient documentation

## 2017-06-21 LAB — CBC WITH DIFFERENTIAL/PLATELET
BASOS ABS: 0.1 10*3/uL (ref 0.0–0.1)
Basophils Relative: 1.1 % (ref 0.0–3.0)
Eosinophils Absolute: 0.2 10*3/uL (ref 0.0–0.7)
Eosinophils Relative: 2.4 % (ref 0.0–5.0)
HCT: 36.7 % (ref 36.0–46.0)
Hemoglobin: 12.3 g/dL (ref 12.0–15.0)
Lymphocytes Relative: 17 % (ref 12.0–46.0)
Lymphs Abs: 1.6 10*3/uL (ref 0.7–4.0)
MCHC: 33.6 g/dL (ref 30.0–36.0)
MCV: 93 fl (ref 78.0–100.0)
MONO ABS: 0.6 10*3/uL (ref 0.1–1.0)
Monocytes Relative: 6.5 % (ref 3.0–12.0)
NEUTROS PCT: 73 % (ref 43.0–77.0)
Neutro Abs: 6.9 10*3/uL (ref 1.4–7.7)
Platelets: 239 10*3/uL (ref 150.0–400.0)
RBC: 3.94 Mil/uL (ref 3.87–5.11)
RDW: 13.3 % (ref 11.5–15.5)
WBC: 9.5 10*3/uL (ref 4.0–10.5)

## 2017-06-21 LAB — LIPID PANEL
CHOL/HDL RATIO: 3
Cholesterol: 138 mg/dL (ref 0–200)
HDL: 45 mg/dL (ref >39.00)
LDL Cholesterol: 75 mg/dL (ref 0–99)
NONHDL: 92.89
Triglycerides: 90 mg/dL (ref 0.0–149.0)
VLDL: 18 mg/dL (ref 0.0–40.0)

## 2017-06-21 LAB — COMPREHENSIVE METABOLIC PANEL
ALT: 12 U/L (ref 0–35)
AST: 12 U/L (ref 0–37)
Albumin: 3.6 g/dL (ref 3.5–5.2)
Alkaline Phosphatase: 73 U/L (ref 39–117)
BUN: 20 mg/dL (ref 6–23)
CO2: 28 meq/L (ref 19–32)
CREATININE: 1.24 mg/dL — AB (ref 0.40–1.20)
Calcium: 9 mg/dL (ref 8.4–10.5)
Chloride: 106 mEq/L (ref 96–112)
GFR: 44.33 mL/min — AB (ref >60.00)
GLUCOSE: 119 mg/dL — AB (ref 70–99)
Potassium: 4 mEq/L (ref 3.5–5.1)
SODIUM: 142 meq/L (ref 135–145)
Total Bilirubin: 0.6 mg/dL (ref 0.2–1.2)
Total Protein: 6.1 g/dL (ref 6.0–8.3)

## 2017-06-21 LAB — HEMOGLOBIN A1C: HEMOGLOBIN A1C: 5.9 % (ref 4.6–6.5)

## 2017-06-21 LAB — VITAMIN D 25 HYDROXY (VIT D DEFICIENCY, FRACTURES): VITD: 28.78 ng/mL — ABNORMAL LOW (ref 30.00–100.00)

## 2017-06-21 LAB — IBC PANEL
Iron: 46 ug/dL (ref 42–145)
SATURATION RATIOS: 14.3 % — AB (ref 20.0–50.0)
TRANSFERRIN: 230 mg/dL (ref 212.0–360.0)

## 2017-06-21 MED ORDER — GLUCOSE BLOOD VI STRP: ORAL_STRIP | 12 refills | Status: AC

## 2017-06-21 NOTE — Patient Instructions (Signed)
Your iron deficiency may be due to an ulcer in the stomach,  colon Cancer, a bone marrow problem,  Or due to your kidney disease.  If the iron has dropped again  I STRONGLY ADVISE you to accept a referral to a Gastroenterologist to have a GI evaluation   I have refilled your tests strips  Please ask your EYE DOCTOR to send me a record of your diabetic eye exam

## 2017-06-21 NOTE — Progress Notes (Signed)
Subjective:  Patient ID: Candace Monroe, female    DOB: 1940-01-30  Age: 77 y.o. MRN: [id number]  CC: The primary encounter diagnosis was Hyperlipidemia LDL goal <100. Diagnoses of Type 2 diabetes mellitus with stage 2 chronic kidney disease, without long-term current use of insulin (HCC), Iron deficiency anemia, unspecified iron deficiency anemia type, Vitamin D deficiency, Bilateral hearing loss, unspecified hearing loss type, Tubular adenoma of colon, Essential hypertension, and Carotid artery stenosis with cerebral infarction [facility]) were also pertinent to this visit.  HPI Candace Monroe presents for 6 month follow up on diabetes with CKD, hypertension and anemia .  Patient has no complaints today.  Patient is following a low glycemic index diet and taking all prescribed medications regularly without side effects.  Fasting sugars have been under less than 140 most of the time and post prandials have been under 160 except on rare occasions. Patient is not exercising and not  intentionally trying to lose weight but has lost 4 lbs since last visit  .  Patient has had an eye exam in the last 12 months and checks feet regularly for signs of infection.  Patient does not walk barefoot outside,  And denies any numbness tingling or burning in feet. Patient is up to date on all recommended vaccinations  IDA:  Receiving ferraheme  (2 in Feb 2019) with good response of hgb, which rose from 9.6 to 12.8 .  She has declined  GI EVALUATION  .  Does not recall being offered,  Bust states that she is HOH .  I again advised her and her husband, who was brought in from the waiting room, to consider a GI evaluation to find the source of the anemia.  She has again deferred. Her last colonscopy  Was done in 2013 and recovered tubular adenomas.     Eye exam in July in [place]    Lab Results  Component Value Date   HGBA1C 5.9 06/21/2017    Outpatient Medications Prior to Visit  Medication Sig Dispense Refill  .  amitriptyline (ELAVIL) 10 MG tablet TAKE 1 TABLET BY MOUTH EVERY DAY AT BEDTIME AS NEEDED FOR INSOMNIA 90 tablet 1  . amLODipine (NORVASC) 10 MG tablet TAKE 1 TABLET BY MOUTH EVERY DAY 90 tablet 1  . aspirin 81 MG tablet Take 81 mg by mouth daily.    Marland Kitchen atorvastatin (LIPITOR) 20 MG tablet TAKE 1 TABLET BY MOUTH EVERY DAY 90 tablet 1  . blood glucose meter kit and supplies Please dispense One Touch meter, E11.22 1 each 0  . doxazosin (CARDURA) 2 MG tablet TAKE 1 TABLET BY MOUTH EVERY DAY 30 tablet 1  . famotidine (PEPCID) 20 MG tablet TAKE 1 TABLET (20 MG TOTAL) BY MOUTH DAILY. 90 tablet 1  . fexofenadine (ALLEGRA) 180 MG tablet TAKE 1 TABLET BY MOUTH EVERY DAY 30 tablet 4  . fluticasone (FLONASE) 50 MCG/ACT nasal spray Place 1 spray into both nostrils daily.    . Lancets (FREESTYLE) lancets USE AS DIRECTED ONCE DAILY 100 each 0  . losartan (COZAAR) 100 MG tablet Take 1 tablet (100 mg total) by mouth daily. 90 tablet 0  . senna-docusate (SENOKOT-S) 8.6-50 MG per tablet Take 2 tablets by mouth at bedtime. 60 tablet 0  . sodium bicarbonate 650 MG tablet Take 1 tablet (650 mg total) by mouth 2 (two) times daily. 180 tablet 1  . traMADol (ULTRAM) 50 MG tablet TAKE 1 TABLET BY MOUTH EVERY 6 HOURS AS NEEDED FOR MODERATE  PAIN 30 tablet 4  . vitamin B-12 (CYANOCOBALAMIN) 1000 MCG tablet Take 1 tablet (1,000 mcg total) by mouth daily. 30 tablet 6  . glucose blood test strip Use as instructed, one touch to check blood glucose up to 3 times a day. E11.22 100 each 12   No facility-administered medications prior to visit.     Review of Systems;  Patient denies headache, fevers, malaise, unintentional weight loss, skin rash, eye pain, sinus congestion and sinus pain, sore throat, dysphagia,  hemoptysis , cough, dyspnea, wheezing, chest pain, palpitations, orthopnea, edema, abdominal pain, nausea, melena, diarrhea, constipation, flank pain, dysuria, hematuria, urinary  Frequency, nocturia, numbness, tingling,  seizures,  Focal weakness, Loss of consciousness,  Tremor, insomnia, depression, anxiety, and suicidal ideation.      Objective:  BP (!) 124/58 (BP Location: Left Arm, Patient Position: Sitting, Cuff Size: Normal)   Pulse 84   Temp 99.2 F (37.3 C) (Oral)   Resp 16   Ht 4' 10"  (1.473 m)   Wt 129 lb (58.5 kg)   SpO2 96%   BMI 26.96 kg/m   BP Readings from Last 3 Encounters:  06/21/17 (!) 124/58  04/01/17 (!) 146/45  03/25/17 128/72    Wt Readings from Last 3 Encounters:  06/21/17 129 lb (58.5 kg)  04/01/17 133 lb 0.8 oz (60.3 kg)  03/25/17 131 lb (59.4 kg)    General appearance: alert, cooperative and appears stated age Ears: normal TM's and external ear canals both ears Throat: lips, mucosa, and tongue normal; teeth and gums normal Neck: no adenopathy, no carotid bruit, supple, symmetrical, trachea midline and thyroid not enlarged, symmetric, no tenderness/mass/nodules Back: symmetric, no curvature. ROM normal. No CVA tenderness. Lungs: clear to auscultation bilaterally Heart: regular rate and rhythm, S1, S2 normal, no murmur, click, rub or gallop Abdomen: soft, non-tender; bowel sounds normal; no masses,  no organomegaly Pulses: 2+ and symmetric Skin: Skin color, texture, turgor normal. No rashes or lesions Lymph nodes: Cervical, supraclavicular, and axillary nodes normal.  Lab Results  Component Value Date   HGBA1C 5.9 06/21/2017   HGBA1C 6.3 12/20/2016   HGBA1C 5.9 06/20/2016    Lab Results  Component Value Date   CREATININE 1.24 (H) 06/21/2017   CREATININE 1.64 (H) 01/17/2017   CREATININE 1.64 (H) 12/20/2016    Lab Results  Component Value Date   WBC 9.5 06/21/2017   HGB 12.3 06/21/2017   HCT 36.7 06/21/2017   PLT 239.0 06/21/2017   GLUCOSE 119 (H) 06/21/2017   CHOL 138 06/21/2017   TRIG 90.0 06/21/2017   HDL 45.00 06/21/2017   LDLDIRECT 73.0 02/22/2016   LDLCALC 75 06/21/2017   ALT 12 06/21/2017   AST 12 06/21/2017   NA 142 06/21/2017   K 4.0  06/21/2017   CL 106 06/21/2017   CREATININE 1.24 (H) 06/21/2017   BUN 20 06/21/2017   CO2 28 06/21/2017   TSH 1.49 12/20/2016   HGBA1C 5.9 06/21/2017   MICROALBUR 211.9 (H) 06/14/2015    No results found.  Assessment & Plan:   Problem List Items Addressed This Visit    Hard of hearing   Tubular adenoma of colon    She has deferred repeat colonoscopy despite development of Iron deficiency anemia in 2017      Iron deficiency anemia    Resolved with 2 rounds of IV iron,  Etiology unclear.  She has deferred GI evaluation in spite of needing follow up on tubular adenomas recovered during 2013 colonoscopy  Relevant Orders   IBC panel (Completed)   CBC with Differential/Platelet (Completed)   Hyperlipidemia LDL goal <100 - Primary    LDL and triglycerides are at goal on current medications. She  has no side effects and liver enzymes are normal. No changes today. Continue aspirin for secondary prevention of stroke   Lab Results  Component Value Date   CHOL 138 06/21/2017   HDL 45.00 06/21/2017   LDLCALC 75 06/21/2017   LDLDIRECT 73.0 02/22/2016   TRIG 90.0 06/21/2017   CHOLHDL 3 06/21/2017   Lab Results  Component Value Date   ALT 12 06/21/2017   AST 12 06/21/2017   ALKPHOS 73 06/21/2017   BILITOT 0.6 06/21/2017         Relevant Orders   Lipid panel (Completed)   HTN (hypertension)    Well controlled on current regimen. Renal function stable, no changes today  Lab Results  Component Value Date   CREATININE 1.24 (H) 06/21/2017   Lab Results  Component Value Date   NA 142 06/21/2017   K 4.0 06/21/2017   CL 106 06/21/2017   CO2 28 06/21/2017   .      Diabetes mellitus with chronic kidney disease ([place])    well-controlled on current medications. . Patient is up-to-date on eye exams and foot exam is normal today. Patient is followed by nephrology for nephropathy and  CKD.  Patient is tolerating statin therapy for CAD risk reduction , but is not on ACE/ARB   Because of prior intolerance.    Lab Results  Component Value Date   HGBA1C 5.9 06/21/2017   Lab Results  Component Value Date   CHOL 138 06/21/2017   HDL 45.00 06/21/2017   LDLCALC 75 06/21/2017   LDLDIRECT 73.0 02/22/2016   TRIG 90.0 06/21/2017   CHOLHDL 3 06/21/2017   Lab Results  Component Value Date   MICROALBUR 211.9 (H) 06/14/2015           Relevant Orders   Hemoglobin A1c (Completed)   Comprehensive metabolic panel (Completed)   Carotid artery stenosis with cerebral infarction [facility])    She has not had annual vascualr folow up on ICA stenosis since 2015 despite my referral to Vascular in 2016.  We[facility] repeat ultrasound at [facility] if patient is willing        Other Visit Diagnoses    Vitamin D deficiency       Relevant Orders   VITAMIN D 25 Hydroxy (Vit-D Deficiency, Fractures) (Completed)    A total of 25 minutes of face to face time was spent with patient more than half of which was spent in counselling about the above mentioned conditions  and coordination of care   I am having Landa B. Ritsema maintain her senna-docusate, aspirin, fluticasone, freestyle, blood glucose meter kit and supplies, traMADol, losartan, sodium bicarbonate, vitamin B-12, amLODipine, famotidine, amitriptyline, fexofenadine, atorvastatin, doxazosin, and glucose blood.  Meds ordered this encounter  Medications  . glucose blood test strip    Sig: Use as instructed, one touch to check blood glucose up to 3 times a day. E11.22    Dispense:  100 each    Refill:  12    Medications Discontinued During This Encounter  Medication Reason  . glucose blood test strip Reorder    Follow-up: No follow-ups on file.   Crecencio Mc, MD

## 2017-06-23 DIAGNOSIS — D126 Benign neoplasm of colon, unspecified: Secondary | ICD-10-CM | POA: Insufficient documentation

## 2017-06-23 NOTE — Assessment & Plan Note (Signed)
Well controlled on current regimen. Renal function stable, no changes today  Lab Results  Component Value Date   CREATININE 1.24 (H) 06/21/2017   Lab Results  Component Value Date   NA 142 06/21/2017   K 4.0 06/21/2017   CL 106 06/21/2017   CO2 28 06/21/2017   .

## 2017-06-23 NOTE — Assessment & Plan Note (Addendum)
Resolved with 2 rounds of IV iron,  Etiology unclear.  She has deferred GI evaluation in spite of needing follow up on tubular adenomas recovered during 2013 colonoscopy

## 2017-06-23 NOTE — Assessment & Plan Note (Signed)
well-controlled on current medications. . Patient is up-to-date on eye exams and foot exam is normal today. Patient is followed by nephrology for nephropathy and  CKD.  Patient is tolerating statin therapy for CAD risk reduction , but is not on ACE/ARB  Because of prior intolerance.    Lab Results  Component Value Date   HGBA1C 5.9 06/21/2017   Lab Results  Component Value Date   CHOL 138 06/21/2017   HDL 45.00 06/21/2017   LDLCALC 75 06/21/2017   LDLDIRECT 73.0 02/22/2016   TRIG 90.0 06/21/2017   CHOLHDL 3 06/21/2017   Lab Results  Component Value Date   MICROALBUR 211.9 (H) 06/14/2015

## 2017-06-23 NOTE — Assessment & Plan Note (Signed)
She has not had annual vascualr folow up on ICA stenosis since 2015 despite my referral to Vascular in 2016.  Weill repeat ultrasound at [facility] if patient is willing

## 2017-06-23 NOTE — Assessment & Plan Note (Addendum)
LDL and triglycerides are at goal on current medications. She  has no side effects and liver enzymes are normal. No changes today. Continue aspirin for secondary prevention of stroke   Lab Results  Component Value Date   CHOL 138 06/21/2017   HDL 45.00 06/21/2017   LDLCALC 75 06/21/2017   LDLDIRECT 73.0 02/22/2016   TRIG 90.0 06/21/2017   CHOLHDL 3 06/21/2017   Lab Results  Component Value Date   ALT 12 06/21/2017   AST 12 06/21/2017   ALKPHOS 73 06/21/2017   BILITOT 0.6 06/21/2017

## 2017-06-23 NOTE — Assessment & Plan Note (Signed)
She has deferred repeat colonoscopy despite development of Iron deficiency anemia in 2017

## 2017-06-28 ENCOUNTER — Other Ambulatory Visit: Payer: Self-pay | Admitting: Internal Medicine

## 2017-06-28 DIAGNOSIS — I63239 Cerebral infarction due to unspecified occlusion or stenosis of unspecified carotid arteries: Secondary | ICD-10-CM

## 2017-06-28 DIAGNOSIS — D126 Benign neoplasm of colon, unspecified: Secondary | ICD-10-CM

## 2017-06-28 DIAGNOSIS — Z1211 Encounter for screening for malignant neoplasm of colon: Secondary | ICD-10-CM

## 2017-06-28 NOTE — Progress Notes (Signed)
[id number]

## 2017-07-01 ENCOUNTER — Inpatient Hospital Stay: Payer: Medicare HMO | Attending: Oncology

## 2017-07-01 ENCOUNTER — Other Ambulatory Visit: Payer: Self-pay

## 2017-07-01 DIAGNOSIS — D509 Iron deficiency anemia, unspecified: Secondary | ICD-10-CM | POA: Insufficient documentation

## 2017-07-01 DIAGNOSIS — D126 Benign neoplasm of colon, unspecified: Secondary | ICD-10-CM

## 2017-07-01 DIAGNOSIS — E538 Deficiency of other specified B group vitamins: Secondary | ICD-10-CM

## 2017-07-01 LAB — CBC
HCT: 36.7 % (ref 35.0–47.0)
HEMOGLOBIN: 12.3 g/dL (ref 12.0–16.0)
MCH: 31.1 pg (ref 26.0–34.0)
MCHC: 33.5 g/dL (ref 32.0–36.0)
MCV: 92.9 fL (ref 80.0–100.0)
Platelets: 248 10*3/uL (ref 150–440)
RBC: 3.95 MIL/uL (ref 3.80–5.20)
RDW: 13.1 % (ref 11.5–14.5)
WBC: 8 10*3/uL (ref 3.6–11.0)

## 2017-07-01 LAB — IRON AND TIBC
Iron: 50 ug/dL (ref 28–170)
Saturation Ratios: 18 % (ref 10.4–31.8)
TIBC: 273 ug/dL (ref 250–450)
UIBC: 223 ug/dL

## 2017-07-01 LAB — FERRITIN: FERRITIN: 98 ng/mL (ref 11–307)

## 2017-07-02 DIAGNOSIS — I10 Essential (primary) hypertension: Secondary | ICD-10-CM | POA: Diagnosis not present

## 2017-07-02 DIAGNOSIS — E872 Acidosis: Secondary | ICD-10-CM | POA: Diagnosis not present

## 2017-07-02 DIAGNOSIS — N2581 Secondary hyperparathyroidism of renal origin: Secondary | ICD-10-CM | POA: Diagnosis not present

## 2017-07-02 DIAGNOSIS — N183 Chronic kidney disease, stage 3 (moderate): Secondary | ICD-10-CM | POA: Diagnosis not present

## 2017-07-02 DIAGNOSIS — D631 Anemia in chronic kidney disease: Secondary | ICD-10-CM | POA: Diagnosis not present

## 2017-07-15 ENCOUNTER — Encounter: Payer: Self-pay | Admitting: *Deleted

## 2017-07-16 ENCOUNTER — Ambulatory Visit: Payer: Medicare HMO | Admitting: Certified Registered"

## 2017-07-16 ENCOUNTER — Encounter
Admission: RE | Disposition: A | Discharge status: Home / Self Care | Payer: Self-pay | Source: Ambulatory Visit | Attending: Gastroenterology

## 2017-07-16 ENCOUNTER — Ambulatory Visit
Admission: RE | Admit: 2017-07-16 | Discharge: 2017-07-16 | Disposition: A | Discharge status: Home / Self Care | Payer: Medicare HMO | Source: Ambulatory Visit | Attending: Gastroenterology | Admitting: Gastroenterology

## 2017-07-16 DIAGNOSIS — Z87891 Personal history of nicotine dependence: Secondary | ICD-10-CM | POA: Insufficient documentation

## 2017-07-16 DIAGNOSIS — K640 First degree hemorrhoids: Secondary | ICD-10-CM | POA: Diagnosis not present

## 2017-07-16 DIAGNOSIS — E1151 Type 2 diabetes mellitus with diabetic peripheral angiopathy without gangrene: Secondary | ICD-10-CM | POA: Insufficient documentation

## 2017-07-16 DIAGNOSIS — I129 Hypertensive chronic kidney disease with stage 1 through stage 4 chronic kidney disease, or unspecified chronic kidney disease: Secondary | ICD-10-CM | POA: Diagnosis not present

## 2017-07-16 DIAGNOSIS — E785 Hyperlipidemia, unspecified: Secondary | ICD-10-CM | POA: Insufficient documentation

## 2017-07-16 DIAGNOSIS — Z7982 Long term (current) use of aspirin: Secondary | ICD-10-CM | POA: Diagnosis not present

## 2017-07-16 DIAGNOSIS — E1122 Type 2 diabetes mellitus with diabetic chronic kidney disease: Secondary | ICD-10-CM | POA: Diagnosis not present

## 2017-07-16 DIAGNOSIS — Z860100 Personal history of colon polyps, unspecified: Secondary | ICD-10-CM

## 2017-07-16 DIAGNOSIS — Z1211 Encounter for screening for malignant neoplasm of colon: Secondary | ICD-10-CM | POA: Diagnosis not present

## 2017-07-16 DIAGNOSIS — N189 Chronic kidney disease, unspecified: Secondary | ICD-10-CM | POA: Insufficient documentation

## 2017-07-16 DIAGNOSIS — K5730 Diverticulosis of large intestine without perforation or abscess without bleeding: Secondary | ICD-10-CM | POA: Insufficient documentation

## 2017-07-16 DIAGNOSIS — Z8673 Personal history of transient ischemic attack (TIA), and cerebral infarction without residual deficits: Secondary | ICD-10-CM | POA: Insufficient documentation

## 2017-07-16 DIAGNOSIS — Z79899 Other long term (current) drug therapy: Secondary | ICD-10-CM | POA: Diagnosis not present

## 2017-07-16 DIAGNOSIS — K5790 Diverticulosis of intestine, part unspecified, without perforation or abscess without bleeding: Secondary | ICD-10-CM | POA: Diagnosis not present

## 2017-07-16 DIAGNOSIS — D126 Benign neoplasm of colon, unspecified: Secondary | ICD-10-CM

## 2017-07-16 DIAGNOSIS — K635 Polyp of colon: Secondary | ICD-10-CM | POA: Diagnosis not present

## 2017-07-16 DIAGNOSIS — D122 Benign neoplasm of ascending colon: Secondary | ICD-10-CM | POA: Diagnosis not present

## 2017-07-16 DIAGNOSIS — Z7951 Long term (current) use of inhaled steroids: Secondary | ICD-10-CM | POA: Diagnosis not present

## 2017-07-16 HISTORY — PX: COLONOSCOPY WITH PROPOFOL: SHX5780

## 2017-07-16 LAB — GLUCOSE, CAPILLARY: Glucose-Capillary: 93 mg/dL (ref 70–99)

## 2017-07-16 SURGERY — COLONOSCOPY WITH PROPOFOL
Anesthesia: General

## 2017-07-16 MED ORDER — LIDOCAINE HCL (PF) 2 % IJ SOLN
INTRAMUSCULAR | Status: AC
  Filled 2017-07-16: qty 10

## 2017-07-16 MED ORDER — PHENYLEPHRINE HCL 10 MG/ML IJ SOLN
INTRAMUSCULAR | Status: AC
  Filled 2017-07-16: qty 1

## 2017-07-16 MED ORDER — PHENYLEPHRINE HCL 10 MG/ML IJ SOLN
INTRAMUSCULAR | Status: DC | PRN
  Administered 2017-07-16 (×2): 100 ug via INTRAVENOUS
  Administered 2017-07-16: 50 ug via INTRAVENOUS

## 2017-07-16 MED ORDER — PROPOFOL 500 MG/50ML IV EMUL
INTRAVENOUS | Status: DC | PRN
  Administered 2017-07-16: 60 ug/kg/min via INTRAVENOUS

## 2017-07-16 MED ORDER — PROPOFOL 500 MG/50ML IV EMUL
INTRAVENOUS | Status: AC
  Filled 2017-07-16: qty 50

## 2017-07-16 MED ORDER — SODIUM CHLORIDE 0.9 % IV SOLN
INTRAVENOUS | Status: DC
  Administered 2017-07-16: 08:00:00 via INTRAVENOUS

## 2017-07-16 MED ORDER — PROPOFOL 10 MG/ML IV BOLUS
INTRAVENOUS | Status: DC | PRN
  Administered 2017-07-16: 30 mg via INTRAVENOUS

## 2017-07-16 MED ORDER — LIDOCAINE HCL (CARDIAC) PF 100 MG/5ML IV SOSY
PREFILLED_SYRINGE | INTRAVENOUS | Status: DC | PRN
  Administered 2017-07-16: 50 mg via INTRAVENOUS

## 2017-07-16 NOTE — Anesthesia Post-op Follow-up Note (Signed)
Anesthesia QCDR form completed.        

## 2017-07-16 NOTE — Anesthesia Postprocedure Evaluation (Signed)
Anesthesia Post Note  Patient: Candace Monroe  Procedure(s) Performed: COLONOSCOPY WITH PROPOFOL (N/A )  Patient location during evaluation: Endoscopy Anesthesia Type: General Level of consciousness: awake and alert Pain management: pain level controlled Vital Signs Assessment: post-procedure vital signs reviewed and stable Respiratory status: spontaneous breathing, nonlabored ventilation, respiratory function stable and patient connected to nasal cannula oxygen Cardiovascular status: blood pressure returned to baseline and stable Postop Assessment: no apparent nausea or vomiting Anesthetic complications: no     Last Vitals:  Vitals:   07/16/17 0843 07/16/17 0853  BP: (!) 132/41 (!) 143/45  Pulse:    Resp:    Temp:    SpO2:      Last Pain:  Vitals:   07/16/17 0853  TempSrc:   PainSc: 0-No pain                 Martha Clan

## 2017-07-16 NOTE — Op Note (Signed)
[facility] Gastroenterology Patient Name: Candace Monroe Procedure Date: 07/16/2017 7:59 AM MRN: [id number] Account #: [id number] Date of Birth: [birth year] Admit Type: Outpatient Age: 77 Room: [facility] ENDO ROOM 4 Gender: Female Note Status: Finalized Procedure:            Colonoscopy Indications:          High risk colon cancer surveillance: Personal history                        of colonic polyps Providers:            Lucilla Lame MD, MD Referring MD:         Deborra Medina, MD (Referring MD) Medicines:            Propofol per Anesthesia Complications:        No immediate complications. Procedure:            Pre-Anesthesia Assessment:                       - Prior to the procedure, a History and Physical was                        performed, and patient medications and allergies were                        reviewed. The patient's tolerance of previous                        anesthesia was also reviewed. The risks and benefits of                        the procedure and the sedation options and risks were                        discussed with the patient. All questions were                        answered, and informed consent was obtained. Prior                        Anticoagulants: The patient has taken no previous                        anticoagulant or antiplatelet agents. ASA Grade                        Assessment: II - A patient with mild systemic disease.                        After reviewing the risks and benefits, the patient was                        deemed in satisfactory condition to undergo the                        procedure.                       After obtaining informed consent, the colonoscope was  passed under direct vision. Throughout the procedure,                        the patient's blood pressure, pulse, and oxygen                        saturations were monitored continuously. The                        Colonoscope was  introduced through the anus and                        advanced to the the cecum, identified by appendiceal                        orifice and ileocecal valve. The colonoscopy was                        performed without difficulty. The patient tolerated the                        procedure well. The quality of the bowel preparation                        was good. Findings:      The perianal and digital rectal examinations were normal.      Six sessile polyps were found in the ascending colon. The polyps were 3       to 9 mm in size. These polyps were removed with a cold snare. Resection       and retrieval were complete. To prevent bleeding post-intervention, one       hemostatic clip was successfully placed (MR conditional). There was no       bleeding at the end of the procedure.      Multiple small-mouthed diverticula were found in the sigmoid colon and       ascending colon.      Non-bleeding internal hemorrhoids were found during retroflexion. The       hemorrhoids were Grade I (internal hemorrhoids that do not prolapse). Impression:           - Six 3 to 9 mm polyps in the ascending colon, removed                        with a cold snare. Resected and retrieved. Clip (MR                        conditional) was placed.                       - Diverticulosis in the sigmoid colon and in the                        ascending colon.                       - Non-bleeding internal hemorrhoids. Recommendation:       - Discharge patient to home.                       - Resume previous diet.                       -  Continue present medications.                       - Await pathology results. Procedure Code(s):    --- Professional ---                       [phone], Colonoscopy, flexible; with removal of tumor(s),                        polyp(s), or other lesion(s) by snare technique Diagnosis Code(s):    --- Professional ---                       Z86.010, Personal history of colonic polyps                        D12.2, Benign neoplasm of ascending colon CPT copyright 2017 American Medical Association. All rights reserved. The codes documented in this report are preliminary and upon coder review may  be revised to meet current compliance requirements. Lucilla Lame MD, MD 07/16/2017 8:23:01 AM This report has been signed electronically. Number of Addenda: 0 Note Initiated On: 07/16/2017 7:59 AM Scope Withdrawal Time: 0 hours 12 minutes 35 seconds  Total Procedure Duration: 0 hours 16 minutes 41 seconds       [facility]

## 2017-07-16 NOTE — Anesthesia Preprocedure Evaluation (Signed)
Anesthesia Evaluation  Patient identified by MRN, date of birth, ID band Patient awake    Reviewed: Allergy & Precautions, H&P , NPO status , Patient's Chart, lab work & pertinent test results, reviewed documented beta blocker date and time   History of Anesthesia Complications Negative for: history of anesthetic complications  Airway Mallampati: III  TM Distance: >3 FB Neck ROM: full    Dental  (+) Upper Dentures, Lower Dentures   Pulmonary neg pulmonary ROS, former smoker,           Cardiovascular Exercise Tolerance: Good hypertension, (-) angina+ Peripheral Vascular Disease  (-) CAD, (-) Past MI, (-) Cardiac Stents and (-) CABG (-) dysrhythmias (-) Valvular Problems/Murmurs     Neuro/Psych neg Seizures PSYCHIATRIC DISORDERS Depression CVA, Residual Symptoms negative neurological ROS     GI/Hepatic negative GI ROS, Neg liver ROS,   Endo/Other  diabetes  Renal/GU CRFRenal disease  negative genitourinary   Musculoskeletal   Abdominal   Peds  Hematology negative hematology ROS (+)   Anesthesia Other Findings Past Medical History: No date: Anemia No date: Arthritis No date: Breast mass No date: Chronic kidney disease No date: Depression No date: Diabetes mellitus without complication (HCC) No date: Diverticulitis No date: HTN (hypertension) No date: Hyperlipidemia Sept-Oct 2015: Stroke ([place]) No date: Urine incontinence   Reproductive/Obstetrics negative OB ROS                             Anesthesia Physical Anesthesia Plan  ASA: III  Anesthesia Plan: General   Post-op Pain Management:    Induction: Intravenous  PONV Risk Score and Plan: 3 and Propofol infusion and TIVA  Airway Management Planned: Nasal Cannula  Additional Equipment:   Intra-op Plan:   Post-operative Plan:   Informed Consent: I have reviewed the patients History and Physical, chart, labs and discussed  the procedure including the risks, benefits and alternatives for the proposed anesthesia with the patient or authorized representative who has indicated his/her understanding and acceptance.   Dental Advisory Given  Plan Discussed with: Anesthesiologist, CRNA and Surgeon  Anesthesia Plan Comments:         Anesthesia Quick Evaluation

## 2017-07-16 NOTE — H&P (Signed)
Candace Lame, MD [address] [place], [place] [address] Phone:[phone] Fax : [phone]  Primary Care Physician:  Candace Mc, MD Primary Gastroenterologist:  Dr. Allen Norris  Pre-Procedure History & Physical: HPI:  Candace Monroe is a 77 y.o. female is here for an colonoscopy.   Past Medical History:  Diagnosis Date  . Anemia   . Arthritis   . Breast mass   . Chronic kidney disease   . Depression   . Diabetes mellitus without complication ([place])   . Diverticulitis   . HTN (hypertension)   . Hyperlipidemia   . Stroke ([place]) Sept-Oct 2015  . Urine incontinence     Past Surgical History:  Procedure Laterality Date  . ABDOMINAL HYSTERECTOMY  1973  . BREAST SURGERY    . CHOLECYSTECTOMY  1990  . TONSILLECTOMY      Prior to Admission medications   Medication Sig Start Date End Date Taking? Authorizing Provider  amitriptyline (ELAVIL) 10 MG tablet TAKE 1 TABLET BY MOUTH EVERY DAY AT BEDTIME AS NEEDED FOR INSOMNIA 04/01/17  Yes Candace Mc, MD  amLODipine (NORVASC) 10 MG tablet TAKE 1 TABLET BY MOUTH EVERY DAY 02/04/17  Yes Candace Mc, MD  aspirin 81 MG tablet Take 81 mg by mouth daily.   Yes [provider]  atorvastatin (LIPITOR) 20 MG tablet TAKE 1 TABLET BY MOUTH EVERY DAY 06/04/17  Yes Candace Mc, MD  doxazosin (CARDURA) 2 MG tablet TAKE 1 TABLET BY MOUTH EVERY DAY 06/11/17  Yes Candace Mc, MD  famotidine (PEPCID) 20 MG tablet TAKE 1 TABLET (20 MG TOTAL) BY MOUTH DAILY. 02/07/17  Yes Candace Mc, MD  fexofenadine (ALLEGRA) 180 MG tablet TAKE 1 TABLET BY MOUTH EVERY DAY 05/16/17  Yes Candace Mc, MD  fluticasone (FLONASE) 50 MCG/ACT nasal spray Place 1 spray into both nostrils daily. 08/26/13  Yes [provider]  glucose blood test strip Use as instructed, one touch to check blood glucose up to 3 times a day. E11.22 06/21/17  Yes Candace Mc, MD  Lancets (FREESTYLE) lancets USE AS DIRECTED ONCE DAILY 01/26/16  Yes Candace Mc, MD  losartan (COZAAR) 100 MG tablet Take 1 tablet (100 mg total) by mouth daily. 01/14/17  Yes Candace Mc, MD  senna-docusate (SENOKOT-S) 8.6-50 MG per tablet Take 2 tablets by mouth at bedtime. 10/21/13  Yes Love, Candace Anchors, PA-C  sodium bicarbonate 650 MG tablet Take 1 tablet (650 mg total) by mouth 2 (two) times daily. 01/14/17  Yes Candace Mc, MD  blood glucose meter kit and supplies Please dispense One Touch meter, E11.22 03/06/16   Candace Mc, MD  traMADol (ULTRAM) 50 MG tablet TAKE 1 TABLET BY MOUTH EVERY 6 HOURS AS NEEDED FOR MODERATE PAIN Patient not taking: Reported on 07/16/2017 08/20/16   Candace Mc, MD  vitamin B-12 (CYANOCOBALAMIN) 1000 MCG tablet Take 1 tablet (1,000 mcg total) by mouth daily. 01/28/17   Candace Guadeloupe, MD    Allergies as of 07/01/2017 - Review Complete 06/21/2017  Allergen Reaction Noted  . Ace inhibitors  10/21/2013  . Augmentin [amoxicillin-pot clavulanate] Itching 10/02/2013  . Sulfa antibiotics Itching 10/02/2013    Family History  Problem Relation Age of Onset  . Stroke Mother   . Cancer Sister 31       breast  . Diabetes Brother   . Seizures Sister   . Diabetes Son     Social History  Socioeconomic History  . Marital status: Married    Spouse name: Not on file  . Number of children: Not on file  . Years of education: Not on file  . Highest education level: Not on file  Occupational History  . Not on file  Social Needs  . Financial resource strain: Not hard at all  . Food insecurity:    Worry: Never true    Inability: Never true  . Transportation needs:    Medical: No    Non-medical: No  Tobacco Use  . Smoking status: Former Smoker    Years: 50.00    Last attempt to quit: 09/01/2013    Years since quitting: 3.8  . Smokeless tobacco: Never Used  Substance and Sexual Activity  . Alcohol use: No    Alcohol/week: 0.0 oz  . Drug use: No  . Sexual activity: Never  Lifestyle  . Physical activity:    Days per  week: Not on file    Minutes per session: Not on file  . Stress: Not on file  Relationships  . Social connections:    Talks on phone: Not on file    Gets together: Not on file    Attends religious service: Not on file    Active member of club or organization: Not on file    Attends meetings of clubs or organizations: Not on file    Relationship status: Not on file  . Intimate partner violence:    Fear of current or ex partner: No    Emotionally abused: No    Physically abused: No    Forced sexual activity: No  Other Topics Concern  . Not on file  Social History Narrative  . Not on file    Review of Systems: See HPI, otherwise negative ROS  Physical Exam: BP (!) 152/80   Pulse 87   Temp (!) 97.2 F (36.2 C) (Tympanic)   Resp 16   Ht 4' 10"  (1.473 m)   Wt 125 lb (56.7 kg)   SpO2 99%   BMI 26.13 kg/m  General:   Alert,  pleasant and cooperative in NAD Head:  Normocephalic and atraumatic. Neck:  Supple; no masses or thyromegaly. Lungs:  Clear throughout to auscultation.    Heart:  Regular rate and rhythm. Abdomen:  Soft, nontender and nondistended. Normal bowel sounds, without guarding, and without rebound.   Neurologic:  Alert and  oriented x4;  grossly normal neurologically.  Impression/Plan: Candace Monroe is here for an colonoscopy to be performed for History of colon polyps Risks, benefits, limitations, and alternatives regarding  colonoscopy have been reviewed with the patient.  Questions have been answered.  All parties agreeable.   Candace Lame, MD  07/16/2017, 7:57 AM

## 2017-07-16 NOTE — Transfer of Care (Signed)
Immediate Anesthesia Transfer of Care Note  Patient: Candace Monroe  Procedure(s) Performed: COLONOSCOPY WITH PROPOFOL (N/A )  Patient Location: PACU  Anesthesia Type:General  Level of Consciousness: sedated and responds to stimulation  Airway & Oxygen Therapy: Patient Spontanous Breathing and Patient connected to nasal cannula oxygen  Post-op Assessment: Report given to RN and Post -op Vital signs reviewed and stable  Post vital signs: Reviewed and stable  Last Vitals:  Vitals Value Taken Time  BP 124/47 07/16/2017  8:25 AM  Temp 36.1 C 07/16/2017  8:23 AM  Pulse 73 07/16/2017  8:25 AM  Resp 18 07/16/2017  8:25 AM  SpO2 100 % 07/16/2017  8:25 AM    Last Pain:  Vitals:   07/16/17 0823  TempSrc: Tympanic  PainSc: Asleep         Complications: No apparent anesthesia complications

## 2017-07-16 NOTE — Anesthesia Procedure Notes (Signed)
Performed by: Madasyn Heath, CRNA Pre-anesthesia Checklist: Patient identified, Emergency Drugs available, Suction available, Patient being monitored and Timeout performed Patient Re-evaluated:Patient Re-evaluated prior to induction Oxygen Delivery Method: Nasal cannula Induction Type: IV induction       

## 2017-07-17 ENCOUNTER — Encounter: Payer: Self-pay | Admitting: Gastroenterology

## 2017-07-17 LAB — SURGICAL PATHOLOGY

## 2017-07-18 DIAGNOSIS — R69 Illness, unspecified: Secondary | ICD-10-CM | POA: Diagnosis not present

## 2017-07-22 ENCOUNTER — Telehealth: Payer: Self-pay | Admitting: Gastroenterology

## 2017-07-22 NOTE — Telephone Encounter (Signed)
Candace Monroe LVM that she had a procedure with Dr. Allen Norris on 07/16/17 and hasn't had a bowel movement since then. She doesn't know what to do?

## 2017-07-30 DIAGNOSIS — H43823 Vitreomacular adhesion, bilateral: Secondary | ICD-10-CM | POA: Diagnosis not present

## 2017-07-30 DIAGNOSIS — H35423 Microcystoid degeneration of retina, bilateral: Secondary | ICD-10-CM | POA: Diagnosis not present

## 2017-07-30 DIAGNOSIS — E113293 Type 2 diabetes mellitus with mild nonproliferative diabetic retinopathy without macular edema, bilateral: Secondary | ICD-10-CM | POA: Diagnosis not present

## 2017-08-03 ENCOUNTER — Other Ambulatory Visit: Payer: Self-pay | Admitting: Internal Medicine

## 2017-08-05 ENCOUNTER — Other Ambulatory Visit: Payer: Self-pay | Admitting: Internal Medicine

## 2017-08-06 ENCOUNTER — Other Ambulatory Visit: Payer: Self-pay | Admitting: Internal Medicine

## 2017-08-07 DIAGNOSIS — L299 Pruritus, unspecified: Secondary | ICD-10-CM | POA: Diagnosis not present

## 2017-08-07 DIAGNOSIS — R69 Illness, unspecified: Secondary | ICD-10-CM | POA: Diagnosis not present

## 2017-08-07 DIAGNOSIS — I10 Essential (primary) hypertension: Secondary | ICD-10-CM | POA: Diagnosis not present

## 2017-08-07 DIAGNOSIS — K219 Gastro-esophageal reflux disease without esophagitis: Secondary | ICD-10-CM | POA: Diagnosis not present

## 2017-08-07 DIAGNOSIS — G4700 Insomnia, unspecified: Secondary | ICD-10-CM | POA: Diagnosis not present

## 2017-08-07 DIAGNOSIS — E785 Hyperlipidemia, unspecified: Secondary | ICD-10-CM | POA: Diagnosis not present

## 2017-08-07 DIAGNOSIS — H04129 Dry eye syndrome of unspecified lacrimal gland: Secondary | ICD-10-CM | POA: Diagnosis not present

## 2017-08-07 DIAGNOSIS — K5900 Constipation, unspecified: Secondary | ICD-10-CM | POA: Diagnosis not present

## 2017-08-07 DIAGNOSIS — D649 Anemia, unspecified: Secondary | ICD-10-CM | POA: Diagnosis not present

## 2017-08-07 DIAGNOSIS — J309 Allergic rhinitis, unspecified: Secondary | ICD-10-CM | POA: Diagnosis not present

## 2017-08-16 DIAGNOSIS — R69 Illness, unspecified: Secondary | ICD-10-CM | POA: Diagnosis not present

## 2017-08-19 ENCOUNTER — Ambulatory Visit (INDEPENDENT_AMBULATORY_CARE_PROVIDER_SITE_OTHER): Payer: Medicare HMO | Admitting: Internal Medicine

## 2017-08-19 ENCOUNTER — Other Ambulatory Visit: Payer: Self-pay

## 2017-08-19 ENCOUNTER — Other Ambulatory Visit: Payer: Self-pay | Admitting: Oncology

## 2017-08-19 DIAGNOSIS — N182 Chronic kidney disease, stage 2 (mild): Secondary | ICD-10-CM

## 2017-08-19 DIAGNOSIS — I10 Essential (primary) hypertension: Secondary | ICD-10-CM

## 2017-08-19 DIAGNOSIS — I6523 Occlusion and stenosis of bilateral carotid arteries: Secondary | ICD-10-CM

## 2017-08-19 DIAGNOSIS — E1122 Type 2 diabetes mellitus with diabetic chronic kidney disease: Secondary | ICD-10-CM | POA: Diagnosis not present

## 2017-08-19 MED ORDER — HYDRALAZINE HCL 25 MG PO TABS: 25.0000 mg | ORAL_TABLET | Freq: Three times a day (TID) | ORAL | 1 refills | Status: DC

## 2017-08-19 MED ORDER — TRIAMCINOLONE ACETONIDE 0.1 % EX CREA: 1.0000 "application " | TOPICAL_CREAM | Freq: Two times a day (BID) | CUTANEOUS | 0 refills | Status: DC

## 2017-08-19 NOTE — Patient Instructions (Addendum)
You can use the cream twice daily on our face for about 10 day to resolve your rash   I am adding hydralazine fir your blood pressure,  Because it is TOO HIGH~  yOU WILL NEED TO TAKE THIS MEDICATION  3 TIMES DAILY  (BREAKFAST ,  AFTER LUNCH,  AND BEFORE BEDTIME)  Cntinue amlodipine ,  Losartan and doxazosin    I RECOMMEND THAT YOU START USING A PILL BOX!    WE TRIED TO GET YOUR CAROTID ULTRASOUND  SCHEDULED FOR  YOU BUT YOU PUT IT OFF!  WE ARE RESCHEDULING  IT AND  THE OFFICE WILL CALL YOU

## 2017-08-19 NOTE — Progress Notes (Signed)
Subjective:  Patient ID: Candace Monroe, female    DOB: Apr 10, 1940  Age: 77 y.o. MRN: [id number]  CC: Diagnoses of Essential hypertension and Type 2 diabetes mellitus with stage 2 chronic kidney disease, without long-term current use of insulin (Candace Monroe) were pertinent to this visit.  HPI Candace Monroe presents for EVALUATION AND management of a facial  rash that has been present for several weeks . Rash is a linear strak on her left chek  that has not spread.  Had a colonoscopy in July : 6 tubular adenomas removed . 3 yr follow up advised   Carotid ultrasound :  Patient deferred carotid ultrasound when called to schedule it but today does not remember the call or why she wanted to wait until after July 9 ,  But today does not recall what was supposed to happen on July 9   Started on calcitriol by nephrology  Prior to onset of rash,  Thinks it may it may be the cause ,  Has a spot on her scalp as well.  No new detergents.    [name] and notes no adverse effects.  Home BP readings have been done about once per week and are  generally >150/80 .  She is avoiding added salt in her diet and walking regularly about 3 times per week for exercise  .  Outpatient Medications Prior to Visit  Medication Sig Dispense Refill  . amitriptyline (ELAVIL) 10 MG tablet TAKE 1 TABLET BY MOUTH EVERY DAY AT BEDTIME AS NEEDED FOR INSOMNIA 90 tablet 1  . amLODipine (NORVASC) 10 MG tablet TAKE 1 TABLET BY MOUTH EVERY DAY 90 tablet 1  . aspirin 81 MG tablet Take 81 mg by mouth daily.    Marland Kitchen atorvastatin (LIPITOR) 20 MG tablet TAKE 1 TABLET BY MOUTH EVERY DAY 90 tablet 1  . blood glucose meter kit and supplies Please dispense One Touch meter, E11.22 1 each 0  . [facility] VITAMIN B12 1000 MCG tablet TAKE 1 TABLET BY MOUTH EVERY DAY 90 tablet 2  . doxazosin (CARDURA) 2 MG tablet TAKE 1 TABLET BY MOUTH EVERY DAY 90 tablet 1  . famotidine (PEPCID) 20 MG tablet TAKE 1 TABLET  BY MOUTH EVERY DAY 90 tablet 1  . fluticasone (FLONASE) 50 MCG/ACT nasal spray Place 1 spray into both nostrils daily.    Marland Kitchen glucose blood test strip Use as instructed, one touch to check blood glucose up to 3 times a day. E11.22 100 each 12  . Lancets (FREESTYLE) lancets USE AS DIRECTED ONCE DAILY 100 each 0  . losartan (COZAAR) 100 MG tablet Take 1 tablet (100 mg total) by mouth daily. 90 tablet 0  . senna-docusate (SENOKOT-S) 8.6-50 MG per tablet Take 2 tablets by mouth at bedtime. 60 tablet 0  . sodium bicarbonate 650 MG tablet Take 1 tablet (650 mg total) by mouth 2 (two) times daily. 180 tablet 1  . traMADol (ULTRAM) 50 MG tablet TAKE 1 TABLET BY MOUTH EVERY 6 HOURS AS NEEDED FOR MODERATE PAIN 30 tablet 4  . fexofenadine (ALLEGRA) 180 MG tablet TAKE 1 TABLET BY MOUTH EVERY DAY 30 tablet 4   No facility-administered medications prior to visit.     Review of Systems;  Patient denies headache, fevers, malaise, unintentional weight loss, skin rash, eye pain, sinus congestion and sinus pain, sore throat, dysphagia,  hemoptysis , cough, dyspnea, wheezing, chest pain, palpitations, orthopnea, edema, abdominal pain, nausea,  melena, diarrhea, constipation, flank pain, dysuria, hematuria, urinary  Frequency, nocturia, numbness, tingling, seizures,  Focal weakness, Loss of consciousness,  Tremor, insomnia, depression, anxiety, and suicidal ideation.      Objective:  BP (!) 150/60   Pulse 81   Temp 98.5 F (36.9 C) (Oral)   Ht _0  (1.473 m)   Wt 126 lb 9.6 oz (57.4 kg)   SpO2 95%   BMI 26.46 kg/m   BP Readings from Last 3 Encounters:  08/19/17 (!) 150/60  07/16/17 (!) 143/45  06/21/17 (!) 124/58    Wt Readings from Last 3 Encounters:  08/19/17 126 lb 9.6 oz (57.4 kg)  07/16/17 125 lb (56.7 kg)  06/21/17 129 lb (58.5 kg)    General appearance: alert, cooperative and appears stated age Ears: normal TM's and external ear canals both ears Throat: lips, mucosa, and tongue normal;  teeth and gums normal Neck: no adenopathy, no carotid bruit, supple, symmetrical, trachea midline and thyroid not enlarged, symmetric, no tenderness/mass/nodules Back: symmetric, no curvature. ROM normal. No CVA tenderness. Lungs: clear to auscultation bilaterally Heart: regular rate and rhythm, S1, S2 normal, no murmur, click, rub or gallop Abdomen: soft, non-tender; bowel sounds normal; no masses,  no organomegaly Pulses: 2+ and symmetric Skin: Skin color, texture, turgor normal. No rashes or lesions Lymph nodes: Cervical, supraclavicular, and axillary nodes normal.  Lab Results  Component Value Date   HGBA1C 5.9 06/21/2017   HGBA1C 6.3 12/20/2016   HGBA1C 5.9 06/20/2016    Lab Results  Component Value Date   CREATININE 1.24 (H) 06/21/2017   CREATININE 1.64 (H) 01/17/2017   CREATININE 1.64 (H) 12/20/2016    Lab Results  Component Value Date   WBC 8.0 07/01/2017   HGB 12.3 07/01/2017   HCT 36.7 07/01/2017   PLT 248 07/01/2017   GLUCOSE 119 (H) 06/21/2017   CHOL 138 06/21/2017   TRIG 90.0 06/21/2017   HDL 45.00 06/21/2017   LDLDIRECT 73.0 02/22/2016   LDLCALC 75 06/21/2017   ALT 12 06/21/2017   AST 12 06/21/2017   NA 142 06/21/2017   K 4.0 06/21/2017   CL 106 06/21/2017   CREATININE 1.24 (H) 06/21/2017   BUN 20 06/21/2017   CO2 28 06/21/2017   TSH 1.49 12/20/2016   HGBA1C 5.9 06/21/2017   MICROALBUR 211.9 (H) 06/14/2015    No results found.  Assessment & Plan:   Problem List Items Addressed This Visit    HTN (hypertension)    UNCONTROLLED for the last several visits.  Patient insists that she is adherent to the regimen but takes all of her bp meds at the same time . Adding hydralazine 25 mg tid       Relevant Medications   hydrALAZINE (APRESOLINE) 25 MG tablet   Diabetes mellitus with chronic kidney disease ([name])    well-controlled on diet controlled. . Patient is up-to-date on eye exams and foot exam is normal today. Patient is followed by nephrology for  nephropathy and  CKD.  Patient is tolerating statin therapy for CAD risk reduction , but is not on ACE/ARB  Because of prior intolerance.    Lab Results  Component Value Date   HGBA1C 5.9 06/21/2017   Lab Results  Component Value Date   CHOL 138 06/21/2017   HDL 45.00 06/21/2017   LDLCALC 75 06/21/2017   LDLDIRECT 73.0 02/22/2016   TRIG 90.0 06/21/2017   CHOLHDL 3 06/21/2017   Lab Results  Component Value Date   MICROALBUR 211.9 (H) 06/14/2015  A total of 25 minutes of face to face time was spent with patient more than half of which was spent in counselling about the above mentioned conditions  and coordination of care   I am having Anetra B. Boultinghouse start on triamcinolone cream and hydrALAZINE. I am also having her maintain her senna-docusate, aspirin, fluticasone, freestyle, blood glucose meter kit and supplies, traMADol, losartan, sodium bicarbonate, amitriptyline, atorvastatin, glucose blood, amLODipine, famotidine, doxazosin, and [employer] VITAMIN B12.  Meds ordered this encounter  Medications  . triamcinolone cream (KENALOG) 0.1 %    Sig: Apply 1 application topically 2 (two) times daily.    Dispense:  30 g    Refill:  0  . hydrALAZINE (APRESOLINE) 25 MG tablet    Sig: Take 1 tablet (25 mg total) by mouth 3 (three) times daily.    Dispense:  90 tablet    Refill:  1    There are no discontinued medications.  Follow-up: No follow-ups on file.   Crecencio Mc, MD

## 2017-08-19 NOTE — Progress Notes (Signed)
Pre visit review using our clinic review tool, if applicable. No additional management support is needed unless otherwise documented below in the visit note. 

## 2017-08-20 ENCOUNTER — Other Ambulatory Visit: Payer: Self-pay | Admitting: Internal Medicine

## 2017-08-20 NOTE — Assessment & Plan Note (Addendum)
UNCONTROLLED for the last several visits.  Patient insists that she is adherent to the regimen but takes all of her bp meds at the same time . Adding hydralazine 25 mg tid

## 2017-08-20 NOTE — Assessment & Plan Note (Signed)
well-controlled on diet controlled. . Patient is up-to-date on eye exams and foot exam is normal today. Patient is followed by nephrology for nephropathy and  CKD.  Patient is tolerating statin therapy for CAD risk reduction , but is not on ACE/ARB  Because of prior intolerance.    Lab Results  Component Value Date   HGBA1C 5.9 06/21/2017   Lab Results  Component Value Date   CHOL 138 06/21/2017   HDL 45.00 06/21/2017   LDLCALC 75 06/21/2017   LDLDIRECT 73.0 02/22/2016   TRIG 90.0 06/21/2017   CHOLHDL 3 06/21/2017   Lab Results  Component Value Date   MICROALBUR 211.9 (H) 06/14/2015

## 2017-08-23 ENCOUNTER — Ambulatory Visit
Admission: RE | Admit: 2017-08-23 | Discharge: 2017-08-23 | Disposition: A | Discharge status: Home / Self Care | Payer: Medicare HMO | Source: Ambulatory Visit | Attending: Internal Medicine | Admitting: Internal Medicine

## 2017-08-23 DIAGNOSIS — I6523 Occlusion and stenosis of bilateral carotid arteries: Secondary | ICD-10-CM | POA: Diagnosis not present

## 2017-08-23 DIAGNOSIS — I6521 Occlusion and stenosis of right carotid artery: Secondary | ICD-10-CM | POA: Diagnosis not present

## 2017-08-25 ENCOUNTER — Other Ambulatory Visit: Payer: Self-pay | Admitting: Internal Medicine

## 2017-08-25 DIAGNOSIS — I6523 Occlusion and stenosis of bilateral carotid arteries: Secondary | ICD-10-CM

## 2017-08-29 ENCOUNTER — Telehealth: Payer: Self-pay | Admitting: Internal Medicine

## 2017-08-29 NOTE — Telephone Encounter (Signed)
Charted in result notes. 

## 2017-08-29 NOTE — Telephone Encounter (Signed)
Copied from [place] [phone]. Topic: Inquiry >> Aug 29, 2017  2:18 PM Blase Mess A wrote: CRM for notification. See Telephone encounter for: 08/29/17. Reason for CRM: Patients husband called to receive Ms. Rotens labs results.  The Triage nurse line was busy.  He did not want to hold. He asked if someone could call him back after 3:30p at [phone]

## 2017-09-10 ENCOUNTER — Other Ambulatory Visit: Payer: Self-pay | Admitting: Internal Medicine

## 2017-09-14 DIAGNOSIS — R69 Illness, unspecified: Secondary | ICD-10-CM | POA: Diagnosis not present

## 2017-09-30 ENCOUNTER — Ambulatory Visit (INDEPENDENT_AMBULATORY_CARE_PROVIDER_SITE_OTHER): Payer: Medicare HMO | Admitting: Vascular Surgery

## 2017-09-30 ENCOUNTER — Encounter (INDEPENDENT_AMBULATORY_CARE_PROVIDER_SITE_OTHER): Payer: Self-pay | Admitting: Vascular Surgery

## 2017-09-30 VITALS — BP 142/64 | HR 43 | Resp 17 | Ht <= 58 in | Wt 124.0 lb

## 2017-09-30 DIAGNOSIS — I10 Essential (primary) hypertension: Secondary | ICD-10-CM

## 2017-09-30 DIAGNOSIS — E1122 Type 2 diabetes mellitus with diabetic chronic kidney disease: Secondary | ICD-10-CM | POA: Diagnosis not present

## 2017-09-30 DIAGNOSIS — N182 Chronic kidney disease, stage 2 (mild): Secondary | ICD-10-CM

## 2017-09-30 DIAGNOSIS — E785 Hyperlipidemia, unspecified: Secondary | ICD-10-CM

## 2017-09-30 DIAGNOSIS — I63239 Cerebral infarction due to unspecified occlusion or stenosis of unspecified carotid arteries: Secondary | ICD-10-CM

## 2017-09-30 DIAGNOSIS — I120 Hypertensive chronic kidney disease with stage 5 chronic kidney disease or end stage renal disease: Secondary | ICD-10-CM | POA: Diagnosis not present

## 2017-10-01 ENCOUNTER — Encounter (INDEPENDENT_AMBULATORY_CARE_PROVIDER_SITE_OTHER): Payer: Self-pay | Admitting: Vascular Surgery

## 2017-10-01 NOTE — Progress Notes (Signed)
MRN : [id number]  Candace Monroe is a 77 y.o. (18-Mar-1940) female who presents with chief complaint of  Chief Complaint  Patient presents with  . New Patient (Initial Visit)    Carotid stenosis, consult  .  History of Present Illness:   The patient is seen for evaluation of carotid stenosis. The carotid stenosis was identified after Dr Derrel Nip auscultated a carotid bruit.  The patient denies amaurosis fugax. There is no recent history of TIA symptoms or focal motor deficits. 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  There is no history of migraine headaches. There is no history of seizures.  The patient is taking enteric-coated aspirin 81 mg daily.  The patient has a history of coronary artery disease, no recent episodes of angina or shortness of breath. The patient denies PAD or claudication symptoms. There is a history of hyperlipidemia which is being treated with a statin.    Current Meds  Medication Sig  . amitriptyline (ELAVIL) 10 MG tablet TAKE 1 TABLET BY MOUTH EVERY DAY AT BEDTIME AS NEEDED FOR INSOMNIA  . amLODipine (NORVASC) 10 MG tablet TAKE 1 TABLET BY MOUTH EVERY DAY  . aspirin 81 MG tablet Take 81 mg by mouth daily.  Marland Kitchen atorvastatin (LIPITOR) 20 MG tablet TAKE 1 TABLET BY MOUTH EVERY DAY  . blood glucose meter kit and supplies Please dispense One Touch meter, E11.22  . [facility] VITAMIN B12 1000 MCG tablet TAKE 1 TABLET BY MOUTH EVERY DAY  . doxazosin (CARDURA) 2 MG tablet TAKE 1 TABLET BY MOUTH EVERY DAY  . famotidine (PEPCID) 20 MG tablet TAKE 1 TABLET BY MOUTH EVERY DAY  . fexofenadine (ALLEGRA) 180 MG tablet TAKE 1 TABLET BY MOUTH EVERY DAY  . fluticasone (FLONASE) 50 MCG/ACT nasal spray Place 1 spray into both nostrils daily.  Marland Kitchen glucose blood test strip Use as instructed, one touch to check blood glucose up to 3 times a day. E11.22  . hydrALAZINE (APRESOLINE) 25 MG tablet TAKE 1 TABLET BY MOUTH THREE TIMES A DAY  . Lancets (FREESTYLE) lancets USE AS DIRECTED  ONCE DAILY  . losartan (COZAAR) 100 MG tablet Take 1 tablet (100 mg total) by mouth daily.  Marland Kitchen senna-docusate (SENOKOT-S) 8.6-50 MG per tablet Take 2 tablets by mouth at bedtime.  . sodium bicarbonate 650 MG tablet Take 1 tablet (650 mg total) by mouth 2 (two) times daily.  . traMADol (ULTRAM) 50 MG tablet TAKE 1 TABLET BY MOUTH EVERY 6 HOURS AS NEEDED FOR MODERATE PAIN  . triamcinolone cream (KENALOG) 0.1 % Apply 1 application topically 2 (two) times daily.    Past Medical History:  Diagnosis Date  . Anemia   . Arthritis   . Breast mass   . Chronic kidney disease   . Depression   . Diabetes mellitus without complication ([place])   . Diverticulitis   . HTN (hypertension)   . Hyperlipidemia   . Stroke ([place]) Sept-Oct 2015  . Urine incontinence     Past Surgical History:  Procedure Laterality Date  . ABDOMINAL HYSTERECTOMY  1973  . BREAST SURGERY    . CHOLECYSTECTOMY  1990  . COLONOSCOPY WITH PROPOFOL N/A 07/16/2017   Procedure: COLONOSCOPY WITH PROPOFOL;  Surgeon: Lucilla Lame, MD;  Location: [facility] ENDOSCOPY;  Service: Endoscopy;  Laterality: N/A;  . TONSILLECTOMY      Social History Social History   Tobacco Use  . Smoking status: Former Smoker    Years: 50.00    Last attempt to quit: 09/01/2013  Years since quitting: 4.0  . Smokeless tobacco: Never Used  Substance Use Topics  . Alcohol use: No    Alcohol/week: 0.0 standard drinks  . Drug use: No    Family History Family History  Problem Relation Age of Onset  . Stroke Mother   . Cancer Sister 72       breast  . Diabetes Brother   . Seizures Sister   . Diabetes Son   No family history of bleeding/clotting disorders, porphyria or autoimmune disease   Allergies  Allergen Reactions  . Ace Inhibitors     Renal insufficiency  . Augmentin [Amoxicillin-Pot Clavulanate] Itching  . Sulfa Antibiotics Itching     REVIEW OF SYSTEMS (Negative unless checked)  Constitutional: [] Weight loss  [] Fever  [] Chills Cardiac:  [] Chest pain   [] Chest pressure   [] Palpitations   [] Shortness of breath when laying flat   [] Shortness of breath with exertion. Vascular:  [] Pain in legs with walking   [] Pain in legs at rest  [] History of DVT   [] Phlebitis   [] Swelling in legs   [] Varicose veins   [] Non-healing ulcers Pulmonary:   [] Uses home oxygen   [] Productive cough   [] Hemoptysis   [] Wheeze  [] COPD   [] Asthma Neurologic:  [] Dizziness   [] Seizures   [x] History of stroke   [] History of TIA  [] Aphasia   [] Vissual changes   [] Weakness or numbness in arm   [] Weakness or numbness in leg Musculoskeletal:   [] Joint swelling   [] Joint pain   [] Low back pain Hematologic:  [] Easy bruising  [] Easy bleeding   [] Hypercoagulable state   [] Anemic Gastrointestinal:  [] Diarrhea   [] Vomiting  [] Gastroesophageal reflux/heartburn   [] Difficulty swallowing. Genitourinary:  [] Chronic kidney disease   [] Difficult urination  [] Frequent urination   [] Blood in urine Skin:  [] Rashes   [] Ulcers  Psychological:  [] History of anxiety   []  History of major depression.  Physical Examination  Vitals:   09/30/17 1310  BP: (!) 142/64  Pulse: (!) 43  Resp: 17  Weight: 124 lb (56.2 kg)  Height: 4' 10"  (1.473 m)   Body mass index is 25.92 kg/m. Gen: WD/WN, NAD Head: [place]/AT, No temporalis wasting.  Ear/Nose/Throat: Hearing grossly intact, nares w/o erythema or drainage, poor dentition Eyes: PER, EOMI, sclera nonicteric.  Neck: Supple, no masses.  No bruit or JVD.  Pulmonary:  Good air movement, clear to auscultation bilaterally, no use of accessory muscles.  Cardiac: RRR, normal S1, S2, no Murmurs. Vascular:  Bilateral carotid bruit Vessel Right Left  Radial Palpable Palpable  Brachial Palpable Palpable  Carotid Palpable Palpable  Gastrointestinal: soft, non-distended. No guarding/no peritoneal signs.  Musculoskeletal: M/S 5/5 throughout.  No deformity or atrophy.  Neurologic: CN 2-12 intact. Pain and light touch intact in extremities.   Symmetrical.  Speech is fluent. Motor exam as listed above. Psychiatric: Judgment intact, Mood & affect appropriate for pt's clinical situation. Dermatologic: No rashes or ulcers noted.  No changes consistent with cellulitis. Lymph : No Cervical lymphadenopathy, no lichenification or skin changes of chronic lymphedema.  CBC Lab Results  Component Value Date   WBC 8.0 07/01/2017   HGB 12.3 07/01/2017   HCT 36.7 07/01/2017   MCV 92.9 07/01/2017   PLT 248 07/01/2017    BMET    Component Value Date/Time   NA 142 06/21/2017 0957   NA 142 09/30/2013 0434   K 4.0 06/21/2017 0957   K 3.8 09/30/2013 0434   CL 106 06/21/2017 0957   CL 110 (H)  09/30/2013 0434   CO2 28 06/21/2017 0957   CO2 25 09/30/2013 0434   GLUCOSE 119 (H) 06/21/2017 0957   GLUCOSE 108 (H) 09/30/2013 0434   BUN 20 06/21/2017 0957   BUN 22 (H) 09/30/2013 0434   CREATININE 1.24 (H) 06/21/2017 0957   CREATININE 1.29 09/30/2013 0434   CALCIUM 9.0 06/21/2017 0957   CALCIUM 8.7 09/30/2013 0434   GFRNONAA 29 (L) 01/17/2017 1114   GFRNONAA 43 (L) 09/30/2013 0434   GFRAA 33 (L) 01/17/2017 1114   GFRAA 52 (L) 09/30/2013 0434   CrCl cannot be calculated (Patient's most recent lab result is older than the maximum 21 days allowed.).  COAG No results found for: INR, PROTIME  Radiology No results found.   Assessment/Plan 1. Carotid artery stenosis with cerebral infarction [facility]) The patient remains asymptomatic with respect to the carotid stenosis.  However, the patient has now progressed and has a lesion the is >70%.  Patient should undergo CT angiography of the carotid arteries to define the degree of stenosis of the internal carotid arteries bilaterally and the anatomic suitability for surgery vs. intervention.  If the patient does indeed need surgery cardiac clearance will be required, once cleared the patient will be scheduled for surgery.  The risks, benefits and alternative therapies were reviewed in detail  with the patient.  All questions were answered.  The patient agrees to proceed with imaging.  Continue antiplatelet therapy as prescribed. Continue management of CAD, HTN and Hyperlipidemia. Healthy heart diet, encouraged exercise at least 4 times per week.   - CT ANGIO NECK W OR WO CONTRAST; Future  2. Essential hypertension Continue antihypertensive medications as already ordered, these medications have been reviewed and there are no changes at this time.   3. Type 2 diabetes mellitus with stage 2 chronic kidney disease, without long-term current use of insulin (HCC) Continue hypoglycemic medications as already ordered, these medications have been reviewed and there are no changes at this time.  Hgb A1C to be monitored as already arranged by primary service   4. Hyperlipidemia LDL goal <100 Continue statin as ordered and reviewed, no changes at this time     Hortencia Pilar, MD  10/01/2017 9:18 PM

## 2017-10-03 DIAGNOSIS — R69 Illness, unspecified: Secondary | ICD-10-CM | POA: Diagnosis not present

## 2017-10-04 ENCOUNTER — Telehealth (INDEPENDENT_AMBULATORY_CARE_PROVIDER_SITE_OTHER): Payer: Self-pay | Admitting: Vascular Surgery

## 2017-10-04 NOTE — Telephone Encounter (Signed)
The patient's CT has referred to Radiology and authorized. She can call radiology at [phone] if she has questions about it being scheduled.

## 2017-10-05 ENCOUNTER — Other Ambulatory Visit: Payer: Self-pay | Admitting: Internal Medicine

## 2017-10-07 ENCOUNTER — Inpatient Hospital Stay: Payer: Medicare HMO

## 2017-10-07 ENCOUNTER — Encounter: Payer: Self-pay | Admitting: Oncology

## 2017-10-07 ENCOUNTER — Inpatient Hospital Stay: Payer: Medicare HMO | Attending: Oncology | Admitting: Oncology

## 2017-10-07 VITALS — BP 114/64 | HR 37 | Temp 97.9°F | Resp 18 | Ht <= 58 in | Wt 124.0 lb

## 2017-10-07 DIAGNOSIS — Z79899 Other long term (current) drug therapy: Secondary | ICD-10-CM | POA: Insufficient documentation

## 2017-10-07 DIAGNOSIS — N184 Chronic kidney disease, stage 4 (severe): Secondary | ICD-10-CM | POA: Insufficient documentation

## 2017-10-07 DIAGNOSIS — Z87891 Personal history of nicotine dependence: Secondary | ICD-10-CM | POA: Insufficient documentation

## 2017-10-07 DIAGNOSIS — I10 Essential (primary) hypertension: Secondary | ICD-10-CM | POA: Diagnosis not present

## 2017-10-07 DIAGNOSIS — D508 Other iron deficiency anemias: Secondary | ICD-10-CM

## 2017-10-07 DIAGNOSIS — I69351 Hemiplegia and hemiparesis following cerebral infarction affecting right dominant side: Secondary | ICD-10-CM | POA: Diagnosis not present

## 2017-10-07 DIAGNOSIS — Z7982 Long term (current) use of aspirin: Secondary | ICD-10-CM | POA: Diagnosis not present

## 2017-10-07 DIAGNOSIS — D649 Anemia, unspecified: Secondary | ICD-10-CM

## 2017-10-07 DIAGNOSIS — E538 Deficiency of other specified B group vitamins: Secondary | ICD-10-CM | POA: Diagnosis not present

## 2017-10-07 DIAGNOSIS — D631 Anemia in chronic kidney disease: Secondary | ICD-10-CM | POA: Insufficient documentation

## 2017-10-07 DIAGNOSIS — E875 Hyperkalemia: Secondary | ICD-10-CM

## 2017-10-07 DIAGNOSIS — N2581 Secondary hyperparathyroidism of renal origin: Secondary | ICD-10-CM | POA: Diagnosis not present

## 2017-10-07 DIAGNOSIS — D509 Iron deficiency anemia, unspecified: Secondary | ICD-10-CM

## 2017-10-07 DIAGNOSIS — E785 Hyperlipidemia, unspecified: Secondary | ICD-10-CM | POA: Insufficient documentation

## 2017-10-07 LAB — CBC
HEMATOCRIT: 36.6 % (ref 35.0–47.0)
Hemoglobin: 12.3 g/dL (ref 12.0–16.0)
MCH: 30.8 pg (ref 26.0–34.0)
MCHC: 33.5 g/dL (ref 32.0–36.0)
MCV: 91.9 fL (ref 80.0–100.0)
PLATELETS: 212 10*3/uL (ref 150–440)
RBC: 3.99 MIL/uL (ref 3.80–5.20)
RDW: 13.2 % (ref 11.5–14.5)
WBC: 6.1 10*3/uL (ref 3.6–11.0)

## 2017-10-07 LAB — FERRITIN: Ferritin: 73 ng/mL (ref 11–307)

## 2017-10-07 LAB — VITAMIN B12: Vitamin B-12: 3022 pg/mL — ABNORMAL HIGH (ref 180–914)

## 2017-10-07 LAB — IRON AND TIBC
Iron: 56 ug/dL (ref 28–170)
SATURATION RATIOS: 20 % (ref 10.4–31.8)
TIBC: 276 ug/dL (ref 250–450)
UIBC: 220 ug/dL

## 2017-10-07 NOTE — Progress Notes (Addendum)
Hematology/Oncology Consult note [facility]  Telephone:([phone] [phone]  Patient Care Team: Crecencio Mc, MD as PCP - General (Internal Medicine) Birdie Sons, MD as Referring Physician (Family Medicine) Bary Castilla Forest Gleason, MD (General Surgery)   Name of the patient: Candace Monroe  [id number]  Jan 21, 1940   Date of visit: 10/07/17  Diagnosis- normocytic anemia- multifactorial secondary to iron and B12 deficiency as well as anemia of chronic kidney disease.   Chief complaint/ Reason for visit-routine follow-up of anemia  Heme/Onc history: patient is a 77 year old female with past medical history significant for hypertension, hyperlipidemia, stage IV CKD, secondary hyperparathyroidism, no other medical problems. She has been referred to [place] for evaluation and management of anemia. She also has a history of stroke 3 years ago with some residual right-sided hemiparesis for which she uses a walker to ambulate. Recent blood work checked at the nephrology office revealed a BUN of 27 and creatinine of 1.8. CBC showed white count of 7.4, H&H of 9.7/31 with an MCV of 85 and a platelet count of 291. Random protein electrophoresis did not reveal any evidence of M spike. Liver function tests were within normal limits. Patient reports mild chronic fatigue. Denies any blood loss in stool or urine. Denies any routine use of NSAIDs. She is able to perform her ADLs but needs assistance with IADLs  Anemia workup from 01/17/2017 was as follows: CBC showed white count of 8, H&H of 9.6/29.8 with an MCV of 83.1 and a platelet count of 222. CMP was significant for elevated BUN of 27 creatinine 1.6. Iron study showed a low serum iron of 20 and iron saturation of 5%. TIBC was normal at 410. Ferritin was low at 11. B12 was low normal at 268. Reticulocyte count was low at 1.7 and haptoglobin was elevated to 33. Multiple myeloma panel did not reveal any  monoclonal protein. Both kappa and lambda light chains were elevated in the serum with a normal kappa lambda light chain ratio. Random urine immunofixation was normal.  Patient received 2 doses of feraheme in feb 2019. She also started taking po b12   Interval history-patient is doing well.  Her energy levels are good and she denies any unintentional weight loss.  Denies any blood in her stool or urine.  Denies any consistent use of NSAIDs. She has mild chronic fatigue  ECOG PS- 2 Pain scale- 0   Review of systems- Review of Systems  Constitutional: Negative for chills, fever, malaise/fatigue and weight loss.  HENT: Negative for congestion, ear discharge and nosebleeds.   Eyes: Negative for blurred vision.  Respiratory: Negative for cough, hemoptysis, sputum production, shortness of breath and wheezing.   Cardiovascular: Negative for chest pain, palpitations, orthopnea and claudication.  Gastrointestinal: Negative for abdominal pain, blood in stool, constipation, diarrhea, heartburn, melena, nausea and vomiting.  Genitourinary: Negative for dysuria, flank pain, frequency, hematuria and urgency.  Musculoskeletal: Negative for back pain, joint pain and myalgias.  Skin: Negative for rash.  Neurological: Negative for dizziness, tingling, focal weakness, seizures, weakness and headaches.  Endo/Heme/Allergies: Does not bruise/bleed easily.  Psychiatric/Behavioral: Negative for depression and suicidal ideas. The patient does not have insomnia.       Allergies  Allergen Reactions  . Ace Inhibitors     Renal insufficiency  . Augmentin [Amoxicillin-Pot Clavulanate] Itching  . Sulfa Antibiotics Itching     Past Medical History:  Diagnosis Date  . Anemia   . Arthritis   . Breast mass   .  Chronic kidney disease   . Depression   . Diabetes mellitus without complication ([place])   . Diverticulitis   . HTN (hypertension)   . Hyperlipidemia   . Stroke ([place]) Sept-Oct 2015  . Urine  incontinence      Past Surgical History:  Procedure Laterality Date  . ABDOMINAL HYSTERECTOMY  1973  . BREAST SURGERY    . CHOLECYSTECTOMY  1990  . COLONOSCOPY WITH PROPOFOL N/A 07/16/2017   Procedure: COLONOSCOPY WITH PROPOFOL;  Surgeon: Lucilla Lame, MD;  Location: [facility] ENDOSCOPY;  Service: Endoscopy;  Laterality: N/A;  . TONSILLECTOMY      Social History   Socioeconomic History  . Marital status: Married    Spouse name: Not on file  . Number of children: Not on file  . Years of education: Not on file  . Highest education level: Not on file  Occupational History  . Not on file  Social Needs  . Financial resource strain: Not hard at all  . Food insecurity:    Worry: Never true    Inability: Never true  . Transportation needs:    Medical: No    Non-medical: No  Tobacco Use  . Smoking status: Former Smoker    Years: 50.00    Last attempt to quit: 09/01/2013    Years since quitting: 4.1  . Smokeless tobacco: Never Used  Substance and Sexual Activity  . Alcohol use: No    Alcohol/week: 0.0 standard drinks  . Drug use: No  . Sexual activity: Never  Lifestyle  . Physical activity:    Days per week: Not on file    Minutes per session: Not on file  . Stress: Not on file  Relationships  . Social connections:    Talks on phone: Not on file    Gets together: Not on file    Attends religious service: Not on file    Active member of club or organization: Not on file    Attends meetings of clubs or organizations: Not on file    Relationship status: Not on file  . Intimate partner violence:    Fear of current or ex partner: No    Emotionally abused: No    Physically abused: No    Forced sexual activity: No  Other Topics Concern  . Not on file  Social History Narrative  . Not on file    Family History  Problem Relation Age of Onset  . Stroke Mother   . Cancer Sister 22       breast  . Diabetes Brother   . Seizures Sister   . Diabetes Son      Current  Outpatient Medications:  .  amitriptyline (ELAVIL) 10 MG tablet, TAKE 1 TABLET BY MOUTH EVERY DAY AT BEDTIME AS NEEDED FOR INSOMNIA, Disp: 90 tablet, Rfl: 1 .  amLODipine (NORVASC) 10 MG tablet, TAKE 1 TABLET BY MOUTH EVERY DAY, Disp: 90 tablet, Rfl: 1 .  aspirin 81 MG tablet, Take 81 mg by mouth daily., Disp: , Rfl:  .  atorvastatin (LIPITOR) 20 MG tablet, TAKE 1 TABLET BY MOUTH EVERY DAY, Disp: 90 tablet, Rfl: 1 .  CVS VITAMIN B12 1000 MCG tablet, TAKE 1 TABLET BY MOUTH EVERY DAY, Disp: 90 tablet, Rfl: 2 .  doxazosin (CARDURA) 2 MG tablet, TAKE 1 TABLET BY MOUTH EVERY DAY, Disp: 90 tablet, Rfl: 1 .  famotidine (PEPCID) 20 MG tablet, TAKE 1 TABLET BY MOUTH EVERY DAY, Disp: 90 tablet, Rfl: 1 .  fexofenadine (ALLEGRA) 180  MG tablet, TAKE 1 TABLET BY MOUTH EVERY DAY, Disp: 90 tablet, Rfl: 1 .  glucose blood test strip, Use as instructed, one touch to check blood glucose up to 3 times a day. E11.22, Disp: 100 each, Rfl: 12 .  hydrALAZINE (APRESOLINE) 25 MG tablet, TAKE 1 TABLET BY MOUTH THREE TIMES A DAY, Disp: 90 tablet, Rfl: 1 .  Lancets (FREESTYLE) lancets, USE AS DIRECTED ONCE DAILY, Disp: 100 each, Rfl: 0 .  senna-docusate (SENOKOT-S) 8.6-50 MG per tablet, Take 2 tablets by mouth at bedtime., Disp: 60 tablet, Rfl: 0 .  blood glucose meter kit and supplies, Please dispense One Touch meter, E11.22 (Patient not taking: Reported on 10/07/2017), Disp: 1 each, Rfl: 0 .  fluticasone (FLONASE) 50 MCG/ACT nasal spray, Place 1 spray into both nostrils daily., Disp: , Rfl:  .  losartan (COZAAR) 100 MG tablet, Take 1 tablet (100 mg total) by mouth daily. (Patient not taking: Reported on 10/07/2017), Disp: 90 tablet, Rfl: 0 .  sodium bicarbonate 650 MG tablet, Take 1 tablet (650 mg total) by mouth 2 (two) times daily. (Patient not taking: Reported on 10/07/2017), Disp: 180 tablet, Rfl: 1 .  traMADol (ULTRAM) 50 MG tablet, TAKE 1 TABLET BY MOUTH EVERY 6 HOURS AS NEEDED FOR MODERATE PAIN (Patient not taking:  Reported on 10/07/2017), Disp: 30 tablet, Rfl: 4 .  triamcinolone cream (KENALOG) 0.1 %, Apply 1 application topically 2 (two) times daily. (Patient not taking: Reported on 10/07/2017), Disp: 30 g, Rfl: 0  Physical exam:  Vitals:   10/07/17 1011  BP: 114/64  Pulse: (!) 37  Resp: 18  Temp: 97.9 F (36.6 C)  TempSrc: Tympanic  SpO2: 100%  Weight: 124 lb 0.1 oz (56.3 kg)  Height: _0  (1.473 m)   Physical Exam  Constitutional: She is oriented to person, place, and time. She appears well-developed and well-nourished.  She ambulates with a walker  HENT:  Head: Normocephalic and atraumatic.  Eyes: Pupils are equal, round, and reactive to light. EOM are normal.  Neck: Normal range of motion.  Cardiovascular: Normal rate, regular rhythm and normal heart sounds.  Pulmonary/Chest: Effort normal and breath sounds normal.  Abdominal: Soft. Bowel sounds are normal.  Neurological: She is alert and oriented to person, place, and time.  Skin: Skin is warm and dry.     CMP Latest Ref Rng & Units 06/21/2017  Glucose 70 - 99 mg/dL 119(H)  BUN 6 - 23 mg/dL 20  Creatinine 0.40 - 1.20 mg/dL 1.24(H)  Sodium 135 - 145 mEq/L 142  Potassium 3.5 - 5.1 mEq/L 4.0  Chloride 96 - 112 mEq/L 106  CO2 19 - 32 mEq/L 28  Calcium 8.4 - 10.5 mg/dL 9.0  Total Protein 6.0 - 8.3 g/dL 6.1  Total Bilirubin 0.2 - 1.2 mg/dL 0.6  Alkaline Phos 39 - 117 U/L 73  AST 0 - 37 U/L 12  ALT 0 - 35 U/L 12   CBC Latest Ref Rng & Units 10/07/2017  WBC 3.6 - 11.0 K/uL 6.1  Hemoglobin 12.0 - 16.0 g/dL 12.3  Hematocrit 35.0 - 47.0 % 36.6  Platelets 150 - 440 K/uL 212     Assessment and plan- Patient is a 77 y.o. female with normocytic anemia- multifactorial due to iron and b12 deficiency as well as anemia of chronic kidney disease  Receiving IV iron patient's hemoglobin has normalized and has remained stable around 12.  Iron studies and B12 levels from today are pending.  She did undergo colonoscopy recently which  did not  reveal any evidence of bleeding.   Her manual pulse is 70 today.  I do not think that the machine recorded reading of her heart rate  Repeat CBC with differential along with ferritin and iron studies in 4 and in 8 months and I will see her back in 8 months   Visit Diagnosis 1. Other iron deficiency anemia   2. Normocytic anemia      Dr. Randa Evens, MD, MPH [facility] [phone] 10/07/2017 11:12 AM

## 2017-10-07 NOTE — Progress Notes (Signed)
No new changes noted today   repeat Manual pulse 74

## 2017-10-12 DIAGNOSIS — R69 Illness, unspecified: Secondary | ICD-10-CM | POA: Diagnosis not present

## 2017-10-24 ENCOUNTER — Ambulatory Visit
Admission: RE | Admit: 2017-10-24 | Discharge: 2017-10-24 | Disposition: A | Discharge status: Home / Self Care | Payer: Medicare HMO | Source: Ambulatory Visit | Attending: Vascular Surgery | Admitting: Vascular Surgery

## 2017-10-24 DIAGNOSIS — I63239 Cerebral infarction due to unspecified occlusion or stenosis of unspecified carotid arteries: Secondary | ICD-10-CM

## 2017-10-24 DIAGNOSIS — I700 Atherosclerosis of aorta: Secondary | ICD-10-CM | POA: Insufficient documentation

## 2017-10-24 DIAGNOSIS — I6523 Occlusion and stenosis of bilateral carotid arteries: Secondary | ICD-10-CM | POA: Diagnosis not present

## 2017-10-24 LAB — POCT I-STAT CREATININE: CREATININE: 1.6 mg/dL — AB (ref 0.44–1.00)

## 2017-10-24 MED ORDER — IOHEXOL 350 MG/ML SOLN
60.0000 mL | Freq: Once | INTRAVENOUS | Status: AC | PRN
  Administered 2017-10-24: 60 mL via INTRAVENOUS

## 2017-11-05 ENCOUNTER — Other Ambulatory Visit: Payer: Self-pay | Admitting: Internal Medicine

## 2017-11-07 DIAGNOSIS — E872 Acidosis: Secondary | ICD-10-CM | POA: Diagnosis not present

## 2017-11-07 DIAGNOSIS — N183 Chronic kidney disease, stage 3 (moderate): Secondary | ICD-10-CM | POA: Diagnosis not present

## 2017-11-07 DIAGNOSIS — R809 Proteinuria, unspecified: Secondary | ICD-10-CM | POA: Diagnosis not present

## 2017-11-07 DIAGNOSIS — D631 Anemia in chronic kidney disease: Secondary | ICD-10-CM | POA: Diagnosis not present

## 2017-11-07 DIAGNOSIS — I10 Essential (primary) hypertension: Secondary | ICD-10-CM | POA: Diagnosis not present

## 2017-11-07 DIAGNOSIS — N2581 Secondary hyperparathyroidism of renal origin: Secondary | ICD-10-CM | POA: Diagnosis not present

## 2017-11-09 DIAGNOSIS — R69 Illness, unspecified: Secondary | ICD-10-CM | POA: Diagnosis not present

## 2017-11-11 ENCOUNTER — Ambulatory Visit (INDEPENDENT_AMBULATORY_CARE_PROVIDER_SITE_OTHER): Payer: Medicare HMO | Admitting: Vascular Surgery

## 2017-11-11 ENCOUNTER — Encounter (INDEPENDENT_AMBULATORY_CARE_PROVIDER_SITE_OTHER): Payer: Self-pay | Admitting: Vascular Surgery

## 2017-11-11 VITALS — BP 144/71 | HR 91 | Resp 18 | Ht 61.0 in | Wt 123.0 lb

## 2017-11-11 DIAGNOSIS — Z7982 Long term (current) use of aspirin: Secondary | ICD-10-CM

## 2017-11-11 DIAGNOSIS — E1122 Type 2 diabetes mellitus with diabetic chronic kidney disease: Secondary | ICD-10-CM

## 2017-11-11 DIAGNOSIS — I129 Hypertensive chronic kidney disease with stage 1 through stage 4 chronic kidney disease, or unspecified chronic kidney disease: Secondary | ICD-10-CM | POA: Diagnosis not present

## 2017-11-11 DIAGNOSIS — E785 Hyperlipidemia, unspecified: Secondary | ICD-10-CM

## 2017-11-11 DIAGNOSIS — I63239 Cerebral infarction due to unspecified occlusion or stenosis of unspecified carotid arteries: Secondary | ICD-10-CM

## 2017-11-11 DIAGNOSIS — I10 Essential (primary) hypertension: Secondary | ICD-10-CM

## 2017-11-11 DIAGNOSIS — N182 Chronic kidney disease, stage 2 (mild): Secondary | ICD-10-CM | POA: Diagnosis not present

## 2017-11-11 DIAGNOSIS — Z87891 Personal history of nicotine dependence: Secondary | ICD-10-CM | POA: Diagnosis not present

## 2017-11-11 NOTE — Progress Notes (Signed)
MRN : [id number]  Candace Monroe is a 77 y.o. ([birth year]) female who presents with chief complaint of  Chief Complaint  Patient presents with  . Follow-up    CT results  .  History of Present Illness:   The patient is seen for follow up evaluation of carotid stenosis status post CT angiogram. Patient reports that the test went well with no problems or complications.   The patient denies interval amaurosis fugax. There is no recent or interval TIA symptoms or focal motor deficits. There is no prior documented CVA.  The patient is taking enteric-coated aspirin 81 mg daily.  There is no history of migraine headaches. There is no history of seizures.  The patient has a history of coronary artery disease, no recent episodes of angina or shortness of breath. The patient denies PAD or claudication symptoms. There is a history of hyperlipidemia which is being treated with a statin.    CT angiogram is reviewed by me personally and shows 80% RICA stenosis consistent with calcified plaque at the origin of the right internal carotid artery.   Current Meds  Medication Sig  . amitriptyline (ELAVIL) 10 MG tablet TAKE 1 TABLET BY MOUTH EVERY DAY AT BEDTIME AS NEEDED FOR INSOMNIA  . amLODipine (NORVASC) 10 MG tablet TAKE 1 TABLET BY MOUTH EVERY DAY  . aspirin 81 MG tablet Take 81 mg by mouth daily.  Marland Kitchen atorvastatin (LIPITOR) 20 MG tablet TAKE 1 TABLET BY MOUTH EVERY DAY  . [facility]S VITAMIN B12 1000 MCG tablet TAKE 1 TABLET BY MOUTH EVERY DAY  . doxazosin (CARDURA) 2 MG tablet TAKE 1 TABLET BY MOUTH EVERY DAY  . famotidine (PEPCID) 20 MG tablet TAKE 1 TABLET BY MOUTH EVERY DAY  . fexofenadine (ALLEGRA) 180 MG tablet TAKE 1 TABLET BY MOUTH EVERY DAY  . fluticasone (FLONASE) 50 MCG/ACT nasal spray Place 1 spray into both nostrils daily.  Marland Kitchen glucose blood test strip Use as instructed, one touch to check blood glucose up to 3 times a day. E11.22  . hydrALAZINE (APRESOLINE) 25 MG tablet TAKE 1 TABLET BY  MOUTH THREE TIMES A DAY (Patient taking differently: 50 mg 3 (three) times daily. )  . Lancets (FREESTYLE) lancets USE AS DIRECTED ONCE DAILY  . senna-docusate (SENOKOT-S) 8.6-50 MG per tablet Take 2 tablets by mouth at bedtime.  . [DISCONTINUED] hydrALAZINE (APRESOLINE) 25 MG tablet TAKE 1 TABLET BY MOUTH THREE TIMES A DAY    Past Medical History:  Diagnosis Date  . Anemia   . Arthritis   . Breast mass   . Chronic kidney disease   . Depression   . Diabetes mellitus without complication ([place])   . Diverticulitis   . HTN (hypertension)   . Hyperlipidemia   . Stroke ([place]) Sept-Oct 2015  . Urine incontinence     Past Surgical History:  Procedure Laterality Date  . ABDOMINAL HYSTERECTOMY  1973  . BREAST SURGERY    . CHOLECYSTECTOMY  1990  . COLONOSCOPY WITH PROPOFOL N/A 07/16/2017   Procedure: COLONOSCOPY WITH PROPOFOL;  Surgeon: Lucilla Lame, MD;  Location: [facility] ENDOSCOPY;  Service: Endoscopy;  Laterality: N/A;  . TONSILLECTOMY      Social History Social History   Tobacco Use  . Smoking status: Former Smoker    Years: 50.00    Last attempt to quit: 09/01/2013    Years since quitting: 4.1  . Smokeless tobacco: Never Used  Substance Use Topics  . Alcohol use: No    Alcohol/week: 0.0  standard drinks  . Drug use: No    Family History Family History  Problem Relation Age of Onset  . Stroke Mother   . Cancer Sister 44       breast  . Diabetes Brother   . Seizures Sister   . Diabetes Son     Allergies  Allergen Reactions  . Ace Inhibitors     Renal insufficiency  . Augmentin [Amoxicillin-Pot Clavulanate] Itching  . Sulfa Antibiotics Itching     REVIEW OF SYSTEMS (Negative unless checked)  Constitutional: [] Weight loss  [] Fever  [] Chills Cardiac: [] Chest pain   [] Chest pressure   [] Palpitations   [] Shortness of breath when laying flat   [] Shortness of breath with exertion. Vascular:  [] Pain in legs with walking   [] Pain in legs at rest  [] History of DVT    [] Phlebitis   [] Swelling in legs   [] Varicose veins   [] Non-healing ulcers Pulmonary:   [] Uses home oxygen   [] Productive cough   [] Hemoptysis   [] Wheeze  [] COPD   [] Asthma Neurologic:  [] Dizziness   [] Seizures   [x] History of stroke   [] History of TIA  [] Aphasia   [] Vissual changes   [] Weakness or numbness in arm   [] Weakness or numbness in leg Musculoskeletal:   [] Joint swelling   [x] Joint pain   [] Low back pain Hematologic:  [] Easy bruising  [] Easy bleeding   [] Hypercoagulable state   [] Anemic Gastrointestinal:  [] Diarrhea   [] Vomiting  [] Gastroesophageal reflux/heartburn   [] Difficulty swallowing. Genitourinary:  [] Chronic kidney disease   [] Difficult urination  [] Frequent urination   [] Blood in urine Skin:  [] Rashes   [] Ulcers  Psychological:  [] History of anxiety   []  History of major depression.  Physical Examination  Vitals:   11/11/17 0948  BP: (!) 144/71  Pulse: 91  Resp: 18  Weight: 123 lb (55.8 kg)  Height: 5\' 1"  (1.549 m)   Body mass index is 23.24 kg/m. Gen: WD/WN, NAD Head: [place]/AT, No temporalis wasting.  Ear/Nose/Throat: Hearing grossly intact, nares w/o erythema or drainage Eyes: PER, EOMI, sclera nonicteric.  Neck: Supple, no large masses.   Pulmonary:  Good air movement, no audible wheezing bilaterally, no use of accessory muscles.  Cardiac: RRR, no JVD Vascular:  Bilateral carotid bruits Vessel Right Left  Radial Palpable Palpable  Brachial Palpable Palpable  Carotid Palpable Palpable  Gastrointestinal: Non-distended. No guarding/no peritoneal signs.  Musculoskeletal: M/S 5/5 throughout.  No deformity or atrophy.  Neurologic: CN 2-12 intact. Symmetrical.  Speech is fluent. Motor exam as listed above. Psychiatric: Judgment intact, Mood & affect appropriate for pt's clinical situation. Dermatologic: No rashes or ulcers noted.  No changes consistent with cellulitis. Lymph : No lichenification or skin changes of chronic lymphedema.  CBC Lab Results  Component  Value Date   WBC 6.1 10/07/2017   HGB 12.3 10/07/2017   HCT 36.6 10/07/2017   MCV 91.9 10/07/2017   PLT 212 10/07/2017    BMET    Component Value Date/Time   NA 142 06/21/2017 0957   NA 142 09/30/2013 0434   K 4.0 06/21/2017 0957   K 3.8 09/30/2013 0434   CL 106 06/21/2017 0957   CL 110 (H) 09/30/2013 0434   CO2 28 06/21/2017 0957   CO2 25 09/30/2013 0434   GLUCOSE 119 (H) 06/21/2017 0957   GLUCOSE 108 (H) 09/30/2013 0434   BUN 20 06/21/2017 0957   BUN 22 (H) 09/30/2013 0434   CREATININE 1.60 (H) 10/24/2017 0853   CREATININE 1.29 09/30/2013 0434  CALCIUM 9.0 06/21/2017 0957   CALCIUM 8.7 09/30/2013 0434   GFRNONAA 29 (L) 01/17/2017 1114   GFRNONAA 43 (L) 09/30/2013 0434   GFRAA 33 (L) 01/17/2017 1114   GFRAA 52 (L) 09/30/2013 0434   Estimated Creatinine Clearance: 21.5 mL/min (A) (by C-G formula based on SCr of 1.6 mg/dL (H)).  COAG No results found for: INR, PROTIME  Radiology Ct Angio Neck W Or Wo Contrast  Result Date: 10/24/2017 CLINICAL DATA:  Right and left carotid stenosis by ultrasound. EXAM: CT ANGIOGRAPHY NECK TECHNIQUE: Multidetector CT imaging of the neck was performed using the standard protocol during bolus administration of intravenous contrast. Multiplanar CT image reconstructions and MIPs were obtained to evaluate the vascular anatomy. Carotid stenosis measurements (when applicable) are obtained utilizing NASCET criteria, using the distal internal carotid diameter as the denominator. CONTRAST:  35mL OMNIPAQUE IOHEXOL 350 MG/ML SOLN COMPARISON:  08/23/2017 ultrasound. FINDINGS: Aortic arch: Aortic atherosclerosis. No aneurysm or dissection. Branching pattern of the brachiocephalic vessels is normal. No flow limiting origin stenosis. Right carotid system: Common carotid artery shows some soft and calcified plaque. Stenosis of the distal common carotid artery just proximal to the bifurcation with minimal diameter of 1.2 mm. Compared to an expected common  carotid diameter of 6 mm, this indicates an 80% distal common carotid stenosis. There is calcified plaque affecting the proximal internal and external carotid vessels. Stenosis of the proximal ICA with minimal diameter of 2.8 mm, consistent with a 40-50% stenosis. The vessel does show flow through the skull base and carotid siphon region with typical circumferential siphon calcification. Severe stenosis of the proximal ECA with luminal diameter of 1 mm or less. Left carotid system: Common carotid artery shows atherosclerotic plaque throughout its course, most severe in the distal 3 cm of the vessel where the diameter is as narrow as 2.5 mm. Compared to an expected diameter of 5 mm, this indicates a 50% distal common carotid artery stenosis. There is soft and calcified plaque at the carotid bifurcation and ICA bulb. Minimal diameter of the proximal ICA is as narrow as 2.1 Mm. Compared to a more distal cervical ICA diameter of 4.2 mm, this indicates a 50% stenosis. Beyond that, the ICA is patent through the skull base and siphon region with typical peripheral siphon calcification. Vertebral arteries: Both subclavian arteries show atherosclerotic change without flow limiting stenosis. The right vertebral artery origin shows calcified plaque with stenosis estimated at 40-50%. Beyond that, the vessel is widely patent through the cervical region to and through the foramen magnum, supplying the basilar. There is atherosclerotic plaque at the left vertebral artery origin but with stenosis only about 25%. Beyond that, the vessel is widely patent through the cervical region, through the foramen magnum to the basilar. The left vertebral artery is larger than the right. Skeleton: Chronic cervical spondylosis with disc space narrowing at C3-4. Other neck: No neck mass or adenopathy. Upper chest: No significant or focal pulmonary finding. IMPRESSION: Advanced atherosclerotic disease throughout the arterial system as discussed  above. There is severe stenosis of the distal right common carotid artery with calcified plaque resulting in stenosis of 80%. Soft and calcified plaque at the carotid bifurcation results in severe stenosis of the proximal external carotid artery. Proximal internal carotid artery shows maximal stenosis of 40-50%. Soft and calcified plaque of the distal left common carotid artery with stenosis of 50%. Atherosclerotic disease at the carotid bifurcation and proximal ICA with proximal ICA stenosis of 50%. Electronically Signed   By: Elta Guadeloupe  Shogry M.D.   On: 10/24/2017 12:03     Assessment/Plan 1. Carotid artery stenosis with cerebral infarction [facility]) Recommend:  The patient remains asymptomatic with respect to the carotid stenosis.  However, the patient has now progressed and has a right carotid lesion the is >75%.  Patient's CT angiography of the carotid arteries confirms >75% right ICA stenosis.  The anatomical considerations support surgery over stenting.  This was discussed in detail with the patient.  The patient does indeed need surgery, therefore, cardiac clearance will be arranged. Once cleared the patient will be scheduled for surgery.  She will follow up with me in one month to assess the progress.  The risks, benefits and alternative therapies were reviewed in detail with the patient.  All questions were answered.  The patient agrees to proceed with surgery of the right carotid artery.  Continue antiplatelet therapy as prescribed. Continue management of CAD, HTN and Hyperlipidemia. Healthy heart diet, encouraged exercise at least 4 times per week.    A total of 25 minutes was spent with this patient and greater than 50% was spent in counseling and coordination of care with the patient.  Discussion included the treatment options for vascular disease including indications for surgery and intervention.  Also discussed is the appropriate timing of treatment.  In addition medical therapy was  discussed.  - Ambulatory referral to Cardiology  2. Essential hypertension Continue antihypertensive medications as already ordered, these medications have been reviewed and there are no changes at this time.   3. Type 2 diabetes mellitus with stage 2 chronic kidney disease, without long-term current use of insulin (HCC) Continue hypoglycemic medications as already ordered, these medications have been reviewed and there are no changes at this time.  Hgb A1C to be monitored as already arranged by primary service   4. Hyperlipidemia LDL goal <100 Continue statin as ordered and reviewed, no changes at this time   Hortencia Pilar, MD  11/11/2017 10:28 AM

## 2017-11-14 ENCOUNTER — Encounter: Payer: Self-pay | Admitting: Cardiovascular Disease

## 2017-11-14 ENCOUNTER — Ambulatory Visit: Payer: Medicare HMO | Admitting: Cardiovascular Disease

## 2017-11-14 VITALS — BP 136/64 | HR 82 | Ht <= 58 in | Wt 126.0 lb

## 2017-11-14 DIAGNOSIS — E785 Hyperlipidemia, unspecified: Secondary | ICD-10-CM

## 2017-11-14 DIAGNOSIS — I10 Essential (primary) hypertension: Secondary | ICD-10-CM | POA: Diagnosis not present

## 2017-11-14 DIAGNOSIS — N183 Chronic kidney disease, stage 3 unspecified: Secondary | ICD-10-CM

## 2017-11-14 DIAGNOSIS — I63239 Cerebral infarction due to unspecified occlusion or stenosis of unspecified carotid arteries: Secondary | ICD-10-CM

## 2017-11-14 DIAGNOSIS — I739 Peripheral vascular disease, unspecified: Secondary | ICD-10-CM

## 2017-11-14 DIAGNOSIS — Z01810 Encounter for preprocedural cardiovascular examination: Secondary | ICD-10-CM | POA: Diagnosis not present

## 2017-11-14 DIAGNOSIS — R69 Illness, unspecified: Secondary | ICD-10-CM | POA: Diagnosis not present

## 2017-11-14 DIAGNOSIS — Z87891 Personal history of nicotine dependence: Secondary | ICD-10-CM | POA: Insufficient documentation

## 2017-11-14 DIAGNOSIS — F17200 Nicotine dependence, unspecified, uncomplicated: Secondary | ICD-10-CM

## 2017-11-14 NOTE — Progress Notes (Signed)
Cardiology Office Note  Date:  11/14/2017   ID:  Candace Monroe, DOB [birth year], MRN [id number]  PCP:  Crecencio Mc, MD   Chief Complaint  Patient presents with  . other    Carotid artery stenosis w/Cerebral infarction. Meds reviewed verbally with pt.    HPI:  Ms.Candace Monroe is a 77 year old woman with past medical history of Diabetes CKD CVA, residual right-sided hemiparesis former smoker, 50 yrs, COPD hyperlipidemia  Carotid stenosis on the right normocytic anemia/anemia of chronic kidney disease. Presenting by referral from Dr. Delana Meyer for preoperative cardiovascular evaluation prior to carotid endarterectomy  She presents today with family  Review of outside records indicates severe right carotid stenosis  CT angiogram shows 80% RICA stenosis consistent with calcified plaque at the origin of the right internal carotid artery.   Notes from vascular surgery reviewed anatomical considerations support surgery over stenting.    He does not want another stroke and willing to proceed with surgery Denies any known coronary artery disease, no prior cardiac testing available  He does not ambulate very far secondary to deficiencies from her stroke Uses a walker Very sedentary  EKG personally reviewed by myself on todays visit Shows normal sinus rhythm rate 76 bpm unable to exclude old anterior MI   PMH:   has a past medical history of Anemia, Arthritis, Breast mass, Carotid artery occlusion, Chronic kidney disease, Depression, Diabetes mellitus without complication ([place]), Diverticulitis, HTN (hypertension), Hyperlipidemia, Stroke ([place]) (Sept-Oct 2015), and Urine incontinence.  PSH:    Past Surgical History:  Procedure Laterality Date  . ABDOMINAL HYSTERECTOMY  1973  . BREAST SURGERY    . CHOLECYSTECTOMY  1990  . COLONOSCOPY WITH PROPOFOL N/A 07/16/2017   Procedure: COLONOSCOPY WITH PROPOFOL;  Surgeon: Lucilla Lame, MD;  Location: [facility] ENDOSCOPY;  Service: Endoscopy;   Laterality: N/A;  . TONSILLECTOMY      Current Outpatient Medications  Medication Sig Dispense Refill  . amitriptyline (ELAVIL) 10 MG tablet TAKE 1 TABLET BY MOUTH EVERY DAY AT BEDTIME AS NEEDED FOR INSOMNIA 90 tablet 1  . amLODipine (NORVASC) 10 MG tablet TAKE 1 TABLET BY MOUTH EVERY DAY 90 tablet 1  . aspirin 81 MG tablet Take 81 mg by mouth daily.    Marland Kitchen atorvastatin (LIPITOR) 20 MG tablet TAKE 1 TABLET BY MOUTH EVERY DAY 90 tablet 1  . blood glucose meter kit and supplies Please dispense One Touch meter, E11.22 1 each 0  . CVS VITAMIN B12 1000 MCG tablet TAKE 1 TABLET BY MOUTH EVERY DAY 90 tablet 2  . doxazosin (CARDURA) 2 MG tablet TAKE 1 TABLET BY MOUTH EVERY DAY 90 tablet 1  . famotidine (PEPCID) 20 MG tablet TAKE 1 TABLET BY MOUTH EVERY DAY 90 tablet 1  . fexofenadine (ALLEGRA) 180 MG tablet TAKE 1 TABLET BY MOUTH EVERY DAY 90 tablet 1  . fluticasone (FLONASE) 50 MCG/ACT nasal spray Place 1 spray into both nostrils daily.    Marland Kitchen glucose blood test strip Use as instructed, one touch to check blood glucose up to 3 times a day. E11.22 100 each 12  . hydrALAZINE (APRESOLINE) 50 MG tablet Take 50 mg by mouth 3 (three) times daily.    . Lancets (FREESTYLE) lancets USE AS DIRECTED ONCE DAILY 100 each 0  . losartan (COZAAR) 100 MG tablet Take 1 tablet (100 mg total) by mouth daily. 90 tablet 0  . senna-docusate (SENOKOT-S) 8.6-50 MG per tablet Take 2 tablets by mouth at bedtime. 60 tablet 0   No  current facility-administered medications for this visit.      Allergies:   Ace inhibitors; Augmentin [amoxicillin-pot clavulanate]; and Sulfa antibiotics   Social History:  The patient  reports that she quit smoking about 4 years ago. She quit after 50.00 years of use. She has never used smokeless tobacco. She reports that she does not drink alcohol or use drugs.   Family History:   family history includes Cancer (age of onset: 16) in her sister; Diabetes in her brother and son; Seizures in her  sister; Stroke in her mother.    Review of Systems: Review of Systems  Constitutional: Negative.   Respiratory: Negative.   Cardiovascular: Negative.   Gastrointestinal: Negative.   Musculoskeletal: Negative.        Leg weakness, high fall risk  Neurological: Negative.        Right-sided weakness  Psychiatric/Behavioral: Negative.   All other systems reviewed and are negative.    PHYSICAL EXAM: VS:  BP 136/64 (BP Location: Right Arm, Patient Position: Sitting, Cuff Size: Normal)   Pulse 82   Ht _0  (1.473 m)   Wt 126 lb (57.2 kg)   BMI 26.33 kg/m  , BMI Body mass index is 26.33 kg/m. GEN: , Thin, presenting in a wheelchair HEENT: normal  Neck: no JVD, carotid bruits, or masses Cardiac: RRR; no murmurs, rubs, or gallops,no edema  Respiratory:  clear to auscultation bilaterally, normal work of breathing GI: soft, nontender, nondistended, + BS MS: Grossly normal range of motion Skin: warm and dry, no rash Neuro: Full neurological exam not performed , mild right-sided weakness Psych: euthymic mood, full affect   Recent Labs: 12/20/2016: TSH 1.49 06/21/2017: ALT 12; BUN 20; Potassium 4.0; Sodium 142 10/07/2017: Hemoglobin 12.3; Platelets 212 10/24/2017: Creatinine, Ser 1.60    Lipid Panel Lab Results  Component Value Date   CHOL 138 06/21/2017   HDL 45.00 06/21/2017   LDLCALC 75 06/21/2017   TRIG 90.0 06/21/2017      Wt Readings from Last 3 Encounters:  11/14/17 126 lb (57.2 kg)  11/11/17 123 lb (55.8 kg)  10/07/17 124 lb 0.1 oz (56.3 kg)      ASSESSMENT AND PLAN:  PAD (peripheral artery disease) (HCC) Severe PAD, carotid stenosis Prior stroke, leaving residual right-sided deficits Very sedentary,   CKD (chronic kidney disease) stage 3, GFR 30-59 ml/min (HCC) Stable, recommended aggressive management of her diabetes  Pre-operative cardiovascular examination -  Abnormal EKG concerning for old anterior MI Unable to treadmill, We have ordered stress  test for risk stratification given high risk carotid surgery planned She is sedentary, only walks with a  walker  Carotid artery stenosis with cerebral infarction [facility]) Plan for open carotid endarterectomy with Dr. Ronalee Belts, will be in December Preop evaluation with stress test planned as above Essential hypertension  Hyperlipidemia LDL goal <100 Cholesterol close to goal Recommend she continue her statin  Smoker Reports that she stopped smoking at the time of her stroke Smoked for several decades  Disposition:   F/U as needed We will call her with the results of her stress test   Total encounter time more than 60 minutes  Greater than 50% was spent in counseling and coordination of care with the patient    Orders Placed This Encounter  Procedures  . NM Myocar Multi W/Spect W/Wall Motion / EF  . EKG 12-Lead     Signed, Esmond Plants, M.D., Ph.D. 11/14/2017  [place], [place]

## 2017-11-14 NOTE — Patient Instructions (Addendum)
Medication Instructions:  No changes  If you need a refill on your cardiac medications before your next appointment, please call your pharmacy.    Lab work: No new labs needed   If you have labs (blood work) drawn today and your tests are completely normal, you will receive your results only by: Marland Kitchen MyChart Message (if you have MyChart) OR . A paper copy in the mail If you have any lab test that is abnormal or we need to change your treatment, we will call you to review the results.   Testing/Procedures: We will schedule a lexiscan myoview, preop cardiovascular eval, CAD, PAD [facility] MYOVIEW  Your caregiver has ordered a Stress Test with nuclear imaging. The purpose of this test is to evaluate the blood supply to your heart muscle. This procedure is referred to as a "Non-Invasive Stress Test." This is because other than having an IV started in your vein, nothing is inserted or "invades" your body. Cardiac stress tests are done to find areas of poor blood flow to the heart by determining the extent of coronary artery disease (CAD). Some patients exercise on a treadmill, which naturally increases the blood flow to your heart, while others who are  unable to walk on a treadmill due to physical limitations have a pharmacologic/chemical stress agent called Lexiscan . This medicine will mimic walking on a treadmill by temporarily increasing your coronary blood flow.   Please note: these test may take anywhere between 2-4 hours to complete  PLEASE REPORT TO [facility] AT THE FIRST DESK WILL DIRECT YOU WHERE TO GO  Date of Procedure:_____________________________________  Arrival Time for Procedure:______________________________   PLEASE NOTIFY THE OFFICE AT LEAST 24 HOURS IN ADVANCE IF YOU ARE UNABLE TO KEEP YOUR APPOINTMENT.  [phone] AND  PLEASE NOTIFY NUCLEAR MEDICINE AT [facility] AT LEAST 24 HOURS IN ADVANCE IF YOU ARE UNABLE TO KEEP YOUR APPOINTMENT.  [phone]  How to prepare for your Myoview test:  1. Do not eat or drink after midnight 2. No caffeine for 24 hours prior to test 3. No smoking 24 hours prior to test. 4. Your medication may be taken with water.  If your doctor stopped a medication because of this test, do not take that medication. 5. Ladies, please do not wear dresses.  Skirts or pants are appropriate. Please wear a short sleeve shirt. 6. No perfume, cologne or lotion. 7. Wear comfortable walking shoes. No heels!     Follow-Up: At [facility], you and your health needs are our priority.  As part of our continuing mission to provide you with exceptional heart care, we have created designated Provider Care Teams.  These Care Teams include your primary Cardiologist (physician) and Advanced Practice Providers (APPs -  Physician Assistants and Nurse Practitioners) who all work together to provide you with the care you need, when you need it.  . You will need a follow up appointment in 12 months .   Please call our office 2 months in advance to schedule this appointment.    . Providers on your designated Care Team:   . Murray Hodgkins, NP . Christell Faith, PA-C . Marrianne Mood, PA-C  Any Other Special Instructions Will Be Listed Below (If Applicable).  For educational health videos Log in to : [email or web address] Or : [email or web address], password : [email or web address]

## 2017-11-17 ENCOUNTER — Other Ambulatory Visit: Payer: Self-pay | Admitting: Internal Medicine

## 2017-11-19 ENCOUNTER — Ambulatory Visit
Admission: RE | Admit: 2017-11-19 | Discharge: 2017-11-19 | Disposition: A | Discharge status: Home / Self Care | Payer: Medicare HMO | Source: Ambulatory Visit | Attending: Cardiovascular Disease | Admitting: Cardiovascular Disease

## 2017-11-19 DIAGNOSIS — Z01810 Encounter for preprocedural cardiovascular examination: Secondary | ICD-10-CM

## 2017-11-19 LAB — NM MYOCAR MULTI W/SPECT W/WALL MOTION / EF
CHL CUP MPHR: 141 {beats}/min
CHL CUP RESTING HR STRESS: 77 {beats}/min
CSEPED: 0 min
CSEPHR: 70 %
CSEPPHR: 100 {beats}/min
Estimated workload: 1 METS
Exercise duration (sec): 0 s
LVDIAVOL: 56 mL (ref 46–106)
LVSYSVOL: 24 mL
TID: 1.1

## 2017-11-19 MED ORDER — TECHNETIUM TC 99M TETROFOSMIN IV KIT
30.0000 | PACK | Freq: Once | INTRAVENOUS | Status: AC | PRN
  Administered 2017-11-19: 30.72 via INTRAVENOUS

## 2017-11-19 MED ORDER — TECHNETIUM TC 99M TETROFOSMIN IV KIT
10.6800 | PACK | Freq: Once | INTRAVENOUS | Status: AC | PRN
  Administered 2017-11-19: 10.68 via INTRAVENOUS

## 2017-11-19 MED ORDER — REGADENOSON 0.4 MG/5ML IV SOLN
0.4000 mg | Freq: Once | INTRAVENOUS | Status: AC
  Administered 2017-11-19: 0.4 mg via INTRAVENOUS

## 2017-11-26 ENCOUNTER — Other Ambulatory Visit: Payer: Self-pay | Admitting: Internal Medicine

## 2017-11-26 ENCOUNTER — Telehealth: Payer: Self-pay | Admitting: *Deleted

## 2017-11-26 NOTE — Telephone Encounter (Signed)
-----   Message from Minna Merritts, MD sent at 11/24/2017  7:50 PM EST ----- Stress test Shows region of perfusion defect concerning for blockages Would consider cardiac cath prior to carotid surgery, Could be scheduled first week of dec with me in the hospital

## 2017-11-26 NOTE — Telephone Encounter (Signed)
Results and recommendations reviewed with patient and her spouse. Scheduled her to come in tomorrow to review abnormal results and to discuss other testing. Confirmed appointment for tomorrow with Dr. Rockey Situ at 08:40 AM. She verbalized understanding of our conversation, agreement with plan, and had no further questions at this time.

## 2017-11-26 NOTE — Progress Notes (Signed)
Cardiology Office Note  Date:  11/27/2017   ID:  SINDA LEEDOM, DOB 1940/02/13, MRN [id number]  PCP:  Crecencio Mc, MD   Chief Complaint  Patient presents with  . other    Follow up from [place]. Meds reviewed by the pt. verbally. "doing well." Denies chest pain or shortness of breath.     HPI:  Ms.Darrell Aggarwal is a 77 year old woman with past medical history of Diabetes CKD CVA, residual right-sided hemiparesis, 10/2013 former smoker, 50 yrs, COPD, quit 4 yrs ago hyperlipidemia  Carotid stenosis on the right normocytic anemia/anemia of chronic kidney disease. Presenting by referral from Dr. Delana Meyer for preoperative cardiovascular evaluation prior to carotid endarterectomy  Recent stress test November 19, 2017  Abnormal pharmacologic myocardial perfusion stress test.  There is a moderate in size, moderate in severity, partially reversible inferolateral defect consistent with scar and mild peri-infarct ischemia.  The left ventricular ejection fraction is normal by visual estimation and Siemens calculation (57%). QGS ejection fraction is likely artifactually low due to gating parameters. There is inferolateral hypokinesis.  This is an intermediate risk study.   We have discussed the stress test with her today Very hesitant to proceed with carotid surgery given stress test findings Recommended cardiac catheterization for preop eval Husband does not want to do it on select days in December as he has a " tournament" that he needs to attend.  Would like to be there for her in recovery  Sedentary, presenting in a walker  Other past medical history reviewed  severe right carotid stenosis  CT angiogram shows 80% RICA stenosis consistent with calcified plaque at the origin of the right internal carotid artery.   Notes from vascular surgery reviewed anatomical considerations support surgery over stenting.     MRI brain done revealing acute lacunar infarct left frontal lobe  peri-Rolandic white matter, moderate to severe underlying chronic small vessel disease   PMH:   has a past medical history of Anemia, Arthritis, Breast mass, Carotid artery occlusion, Chronic kidney disease, Depression, Diabetes mellitus without complication ([place]), Diverticulitis, HTN (hypertension), Hyperlipidemia, Stroke ([place]) (Sept-Oct 2015), and Urine incontinence.  PSH:    Past Surgical History:  Procedure Laterality Date  . ABDOMINAL HYSTERECTOMY  1973  . BREAST SURGERY    . CHOLECYSTECTOMY  1990  . COLONOSCOPY WITH PROPOFOL N/A 07/16/2017   Procedure: COLONOSCOPY WITH PROPOFOL;  Surgeon: Lucilla Lame, MD;  Location: [facility] ENDOSCOPY;  Service: Endoscopy;  Laterality: N/A;  . TONSILLECTOMY      Current Outpatient Medications  Medication Sig Dispense Refill  . amitriptyline (ELAVIL) 10 MG tablet TAKE 1 TABLET BY MOUTH EVERY DAY AT BEDTIME AS NEEDED FOR INSOMNIA 90 tablet 1  . amLODipine (NORVASC) 10 MG tablet TAKE 1 TABLET BY MOUTH EVERY DAY 90 tablet 1  . aspirin 81 MG tablet Take 81 mg by mouth daily.    Marland Kitchen atorvastatin (LIPITOR) 20 MG tablet TAKE 1 TABLET BY MOUTH EVERY DAY 90 tablet 1  . blood glucose meter kit and supplies Please dispense One Touch meter, E11.22 1 each 0  . CVS VITAMIN B12 1000 MCG tablet TAKE 1 TABLET BY MOUTH EVERY DAY 90 tablet 2  . doxazosin (CARDURA) 2 MG tablet TAKE 1 TABLET BY MOUTH EVERY DAY 90 tablet 1  . famotidine (PEPCID) 20 MG tablet TAKE 1 TABLET BY MOUTH EVERY DAY 90 tablet 1  . fexofenadine (ALLEGRA) 180 MG tablet TAKE 1 TABLET BY MOUTH EVERY DAY 90 tablet 1  . fluticasone (FLONASE) 50  MCG/ACT nasal spray Place 1 spray into both nostrils daily.    Marland Kitchen glucose blood test strip Use as instructed, one touch to check blood glucose up to 3 times a day. E11.22 100 each 12  . hydrALAZINE (APRESOLINE) 50 MG tablet Take 50 mg by mouth 3 (three) times daily.    . Lancets (FREESTYLE) lancets USE AS DIRECTED ONCE DAILY 100 each 0  . losartan (COZAAR) 100 MG  tablet Take 1 tablet (100 mg total) by mouth daily. 90 tablet 0  . senna-docusate (SENOKOT-S) 8.6-50 MG per tablet Take 2 tablets by mouth at bedtime. 60 tablet 0   No current facility-administered medications for this visit.      Allergies:   Ace inhibitors; Augmentin [amoxicillin-pot clavulanate]; and Sulfa antibiotics   Social History:  The patient  reports that she quit smoking about 4 years ago. She quit after 50.00 years of use. She has never used smokeless tobacco. She reports that she does not drink alcohol or use drugs.   Family History:   family history includes Cancer (age of onset: 18) in her sister; Diabetes in her brother and son; Seizures in her sister; Stroke in her mother.    Review of Systems: Review of Systems  Constitutional: Negative.   Respiratory: Negative.   Cardiovascular: Negative.   Gastrointestinal: Negative.   Musculoskeletal: Negative.        Leg weakness, high fall risk  Neurological: Negative.        Right-sided weakness  Psychiatric/Behavioral: Negative.   All other systems reviewed and are negative.    PHYSICAL EXAM: VS:  BP (!) 140/50 (BP Location: Left Arm, Patient Position: Sitting, Cuff Size: Normal)   Pulse 87   Ht _0  (1.473 m)   Wt 126 lb (57.2 kg)   SpO2 98%   BMI 26.33 kg/m  , BMI Body mass index is 26.33 kg/m. GEN: , Thin, presenting in a wheelchair HEENT: normal  Neck: no JVD, carotid bruits, or masses Cardiac: RRR; no murmurs, rubs, or gallops,no edema  Respiratory:  clear to auscultation bilaterally, normal work of breathing GI: soft, nontender, nondistended, + BS MS: Grossly normal range of motion Skin: warm and dry, no rash Neuro: Full neurological exam not performed , mild right-sided weakness Psych: euthymic mood, full affect   Recent Labs: 12/20/2016: TSH 1.49 06/21/2017: ALT 12; BUN 20; Potassium 4.0; Sodium 142 10/07/2017: Hemoglobin 12.3; Platelets 212 10/24/2017: Creatinine, Ser 1.60    Lipid Panel Lab  Results  Component Value Date   CHOL 138 06/21/2017   HDL 45.00 06/21/2017   LDLCALC 75 06/21/2017   TRIG 90.0 06/21/2017      Wt Readings from Last 3 Encounters:  11/27/17 126 lb (57.2 kg)  11/14/17 126 lb (57.2 kg)  11/11/17 123 lb (55.8 kg)      ASSESSMENT AND PLAN:  PAD (peripheral artery disease) (HCC) Severe PAD, carotid stenosis Prior stroke, leaving residual right-sided deficits Will discuss timing of surgery with vascular Scheduled for cardiac catheterization in December  CKD (chronic kidney disease) stage 3, GFR 30-59 ml/min (HCC) Stable, recommended aggressive management of her diabetes Will need to bring in for fluids prior to cardiac catheterization  Positive stress test Abnormal EKG Stress test concerning for ischemia Preop evaluation; recommend cardiac catheterization for clearance She does not walk very far, unable to give [place] exercise capacity I have reviewed the risks, indications, and alternatives to cardiac catheterization, possible angioplasty, and stenting with the patient. Risks include but are not limited to bleeding,  infection, vascular injury, stroke, myocardial infection, arrhythmia, kidney injury, radiation-related injury in the case of prolonged fluoroscopy use, emergency cardiac surgery, and death. The patient understands the risks of serious complication is 1-2 in 2500 with diagnostic cardiac cath and 1-2% or less with angioplasty/stenting.   Carotid artery stenosis with cerebral infarction [facility]) Cardiac cath scheduled in December  Hyperlipidemia LDL goal <100 Recommend she continue her statin  Smoker Reports that she stopped smoking at the time of her stroke Smoked for several decades  Disposition:   F/U 1 month after catheterization   Total encounter time more than 45 minutes  Greater than 50% was spent in counseling and coordination of care with the patient    No orders of the defined types were placed in this  encounter.    Signed, Esmond Plants, M.D., Ph.D. 11/27/2017  [name], [place]

## 2017-11-27 ENCOUNTER — Encounter: Payer: Self-pay | Admitting: Cardiovascular Disease

## 2017-11-27 ENCOUNTER — Ambulatory Visit: Payer: Medicare HMO | Admitting: Cardiovascular Disease

## 2017-11-27 VITALS — BP 140/50 | HR 87 | Ht <= 58 in | Wt 126.0 lb

## 2017-11-27 DIAGNOSIS — E785 Hyperlipidemia, unspecified: Secondary | ICD-10-CM | POA: Diagnosis not present

## 2017-11-27 DIAGNOSIS — I63239 Cerebral infarction due to unspecified occlusion or stenosis of unspecified carotid arteries: Secondary | ICD-10-CM | POA: Diagnosis not present

## 2017-11-27 DIAGNOSIS — I739 Peripheral vascular disease, unspecified: Secondary | ICD-10-CM | POA: Diagnosis not present

## 2017-11-27 DIAGNOSIS — I25118 Atherosclerotic heart disease of native coronary artery with other forms of angina pectoris: Secondary | ICD-10-CM

## 2017-11-27 DIAGNOSIS — N183 Chronic kidney disease, stage 3 unspecified: Secondary | ICD-10-CM

## 2017-11-27 DIAGNOSIS — I10 Essential (primary) hypertension: Secondary | ICD-10-CM | POA: Diagnosis not present

## 2017-11-27 NOTE — Patient Instructions (Addendum)
We will schedule a cardiac cath  For positive stress test, preop carotid [facility] Cardiac Cath Instructions   You are scheduled for a Cardiac Cath on:_Thursday December 19th  Please arrive at _06:30_am on the day of your procedure  Please expect a call from our [place] to pre-register you  Do not eat/drink anything after midnight  Someone will need to drive you home  It is recommended someone be with you for the first 24 hours after your procedure  Wear clothes that are easy to get on/off and wear slip on shoes if possible   Medications bring a current list of all medications with you  _XX__ You may take all of your medications the morning of your procedure with enough water to swallow safely    Day of your procedure: Arrive at the [place] entrance.  Free valet service is available.  After entering the [facility] please check-in at the registration desk (1st desk on your right) to receive your armband. After receiving your armband someone will escort you to the cardiac cath/special procedures waiting area.  The usual length of stay after your procedure is about 2 to 3 hours.  This can vary.  If you have any questions, please call our office at [phone], or you may call the cardiac cath lab at [facility] directly at [phone]   Medication Instructions:  No changes  If you need a refill on your cardiac medications before your next appointment, please call your pharmacy.    Lab work: No new labs needed   If you have labs (blood work) drawn today and your tests are completely normal, you will receive your results only by: Marland Kitchen MyChart Message (if you have MyChart) OR . A paper copy in the mail If you have any lab test that is abnormal or we need to change your treatment, we will call you to review the results.   Testing/Procedures: No new testing needed   Follow-Up: At [facility], you and your health needs are our priority.  As part of  our continuing mission to provide you with exceptional heart care, we have created designated Provider Care Teams.  These Care Teams include your primary Cardiologist (physician) and Advanced Practice Providers (APPs -  Physician Assistants and Nurse Practitioners) who all work together to provide you with the care you need, when you need it.  . You will need a follow up appointment in 5 weeks .   Please call our office 2 months in advance to schedule this appointment.    . Providers on your designated Care Team:   . Murray Hodgkins, NP . Christell Faith, PA-C . Marrianne Mood, PA-C  Any Other Special Instructions Will Be Listed Below (If Applicable).  For educational health videos Log in to : [email or web address] Or : [email or web address], password : [email or web address]

## 2017-11-28 LAB — CBC WITH DIFFERENTIAL/PLATELET
BASOS: 1 %
Basophils Absolute: 0.1 10*3/uL (ref 0.0–0.2)
EOS (ABSOLUTE): 0.1 10*3/uL (ref 0.0–0.4)
Eos: 2 %
Hematocrit: 37.2 % (ref 34.0–46.6)
Hemoglobin: 12.5 g/dL (ref 11.1–15.9)
IMMATURE GRANS (ABS): 0 10*3/uL (ref 0.0–0.1)
IMMATURE GRANULOCYTES: 0 %
LYMPHS: 13 %
Lymphocytes Absolute: 1 10*3/uL (ref 0.7–3.1)
MCH: 30.3 pg (ref 26.6–33.0)
MCHC: 33.6 g/dL (ref 31.5–35.7)
MCV: 90 fL (ref 79–97)
Monocytes Absolute: 0.5 10*3/uL (ref 0.1–0.9)
Monocytes: 7 %
NEUTROS PCT: 77 %
Neutrophils Absolute: 5.5 10*3/uL (ref 1.4–7.0)
PLATELETS: 272 10*3/uL (ref 150–450)
RBC: 4.13 x10E6/uL (ref 3.77–5.28)
RDW: 12.3 % (ref 12.3–15.4)
WBC: 7.3 10*3/uL (ref 3.4–10.8)

## 2017-11-28 LAB — BASIC METABOLIC PANEL
BUN/Creatinine Ratio: 15 (ref 12–28)
BUN: 23 mg/dL (ref 8–27)
CALCIUM: 9.6 mg/dL (ref 8.7–10.3)
CO2: 21 mmol/L (ref 20–29)
Chloride: 105 mmol/L (ref 96–106)
Creatinine, Ser: 1.51 mg/dL — ABNORMAL HIGH (ref 0.57–1.00)
GFR calc Af Amer: 38 mL/min/{1.73_m2} — ABNORMAL LOW (ref >59)
GFR, EST NON AFRICAN AMERICAN: 33 mL/min/{1.73_m2} — AB (ref >59)
Glucose: 131 mg/dL — ABNORMAL HIGH (ref 65–99)
POTASSIUM: 4.1 mmol/L (ref 3.5–5.2)
Sodium: 142 mmol/L (ref 134–144)

## 2017-12-03 ENCOUNTER — Telehealth: Payer: Self-pay | Admitting: *Deleted

## 2017-12-03 NOTE — Telephone Encounter (Signed)
Spoke with Levada Dy over at Dr. Nino Parsley office to make them aware of patients upcoming heart cath and to see if they can hold her surgery off until after that time. She reports that she will pass this information on to the nurse and will call us back if they have any further questions.

## 2017-12-03 NOTE — Telephone Encounter (Signed)
-----   Message from Minna Merritts, MD sent at 12/01/2017 11:13 AM EST ----- Can we call Dr. Alver Fisher office She is scheduled for an appt there 12/12 but has preop cath 12/19 Can they push back her appt until after cath? thx TG ----- Message ----- From: Valora Corporal, RN Sent: 11/27/2017   9:34 AM EST To: Minna Merritts, MD  Left heart cath  12/19/17 07:30 AM [facility] Dr. Rockey Situ

## 2017-12-04 ENCOUNTER — Telehealth: Payer: Self-pay | Admitting: *Deleted

## 2017-12-04 NOTE — Telephone Encounter (Signed)
-----   Message from Minna Merritts, MD sent at 12/01/2017  1:32 PM EST ----- She will need precath hydration, Would schedule cath late morning

## 2017-12-04 NOTE — Telephone Encounter (Addendum)
Dr. Rockey Situ requested hydration prior to her procedure. Spoke with patients husband and he will have her there at 06:30AM and her procedure was changed to 09:30AM to allow for the hydration prior to her procedure. He verbalized understanding with no further questions at this time.

## 2017-12-10 DIAGNOSIS — R69 Illness, unspecified: Secondary | ICD-10-CM | POA: Diagnosis not present

## 2017-12-12 ENCOUNTER — Encounter (INDEPENDENT_AMBULATORY_CARE_PROVIDER_SITE_OTHER): Payer: Self-pay | Admitting: Vascular Surgery

## 2017-12-12 ENCOUNTER — Ambulatory Visit (INDEPENDENT_AMBULATORY_CARE_PROVIDER_SITE_OTHER): Payer: Medicare HMO | Admitting: Vascular Surgery

## 2017-12-12 VITALS — BP 147/65 | HR 89 | Ht <= 58 in | Wt 120.0 lb

## 2017-12-12 DIAGNOSIS — I63239 Cerebral infarction due to unspecified occlusion or stenosis of unspecified carotid arteries: Secondary | ICD-10-CM

## 2017-12-12 DIAGNOSIS — I739 Peripheral vascular disease, unspecified: Secondary | ICD-10-CM | POA: Diagnosis not present

## 2017-12-12 DIAGNOSIS — I25118 Atherosclerotic heart disease of native coronary artery with other forms of angina pectoris: Secondary | ICD-10-CM

## 2017-12-12 DIAGNOSIS — I10 Essential (primary) hypertension: Secondary | ICD-10-CM

## 2017-12-12 DIAGNOSIS — E1122 Type 2 diabetes mellitus with diabetic chronic kidney disease: Secondary | ICD-10-CM

## 2017-12-12 DIAGNOSIS — E785 Hyperlipidemia, unspecified: Secondary | ICD-10-CM | POA: Diagnosis not present

## 2017-12-12 DIAGNOSIS — N182 Chronic kidney disease, stage 2 (mild): Secondary | ICD-10-CM | POA: Diagnosis not present

## 2017-12-12 NOTE — Progress Notes (Signed)
MRN : [id number]  Candace Monroe is a 77 y.o. (10-05-1940) female who presents with chief complaint of No chief complaint on file. Marland Kitchen  History of Present Illness:   The patient is seen for follow up evaluation of carotid stenosis.  She has had her CT angiogram and we have reviewed it together.  She has also had her initial cardiac evaluation.    The patient denies interval amaurosis fugax. There is no recent or interval TIA symptoms or focal motor deficits. There is no prior documented CVA.  The patient is taking enteric-coated aspirin 81 mg daily.  There is no history of migraine headaches. There is no history of seizures.  The patient has a history of coronary artery disease, no recent episodes of angina or shortness of breath. She failed her stress and is scheduled for a cardiac cath on 12/19/2017.  The patient denies PAD or claudication symptoms. There is a history of hyperlipidemia which is being treated with a statin.   CT angiogram is reviewed by me personally and shows 80% RICA stenosis consistent with calcified plaque at the origin of the right internal carotid artery.    No outpatient medications have been marked as taking for the 12/12/17 encounter (Appointment) with Delana Meyer, Dolores Lory, MD.    Past Medical History:  Diagnosis Date  . Anemia   . Arthritis   . Breast mass   . Carotid artery occlusion    Right side  . Chronic kidney disease   . Depression   . Diabetes mellitus without complication ([place])   . Diverticulitis   . HTN (hypertension)   . Hyperlipidemia   . Stroke ([place]) Sept-Oct 2015  . Urine incontinence     Past Surgical History:  Procedure Laterality Date  . ABDOMINAL HYSTERECTOMY  1973  . BREAST SURGERY    . CHOLECYSTECTOMY  1990  . COLONOSCOPY WITH PROPOFOL N/A 07/16/2017   Procedure: COLONOSCOPY WITH PROPOFOL;  Surgeon: Lucilla Lame, MD;  Location: [facility] ENDOSCOPY;  Service: Endoscopy;  Laterality: N/A;  . TONSILLECTOMY      Social  History Social History   Tobacco Use  . Smoking status: Former Smoker    Years: 50.00    Last attempt to quit: 09/01/2013    Years since quitting: 4.2  . Smokeless tobacco: Never Used  Substance Use Topics  . Alcohol use: No    Alcohol/week: 0.0 standard drinks  . Drug use: No    Family History Family History  Problem Relation Age of Onset  . Stroke Mother   . Cancer Sister 74       breast  . Diabetes Brother   . Seizures Sister   . Diabetes Son     Allergies  Allergen Reactions  . Ace Inhibitors     Renal insufficiency  . Augmentin [Amoxicillin-Pot Clavulanate] Itching  . Sulfa Antibiotics Itching     REVIEW OF SYSTEMS (Negative unless checked)  Constitutional: [] Weight loss  [] Fever  [] Chills Cardiac: [] Chest pain   [] Chest pressure   [] Palpitations   [] Shortness of breath when laying flat   [] Shortness of breath with exertion. Vascular:  [x] Pain in legs with walking   [] Pain in legs at rest  [] History of DVT   [] Phlebitis   [] Swelling in legs   [] Varicose veins   [] Non-healing ulcers Pulmonary:   [] Uses home oxygen   [] Productive cough   [] Hemoptysis   [] Wheeze  [] COPD   [] Asthma Neurologic:  [] Dizziness   [] Seizures   [x] History of stroke   [  x]History of TIA  [] Aphasia   [] Vissual changes   [] Weakness or numbness in arm   [] Weakness or numbness in leg Musculoskeletal:   [] Joint swelling   [] Joint pain   [] Low back pain Hematologic:  [] Easy bruising  [] Easy bleeding   [] Hypercoagulable state   [] Anemic Gastrointestinal:  [] Diarrhea   [] Vomiting  [] Gastroesophageal reflux/heartburn   [] Difficulty swallowing. Genitourinary:  [x] Chronic kidney disease   [] Difficult urination  [] Frequent urination   [] Blood in urine Skin:  [] Rashes   [] Ulcers  Psychological:  [] History of anxiety   []  History of major depression.  Physical Examination  There were no vitals filed for this visit. There is no height or weight on file to calculate BMI. Gen: WD/WN, NAD Head: [place]/AT, No  temporalis wasting.  Ear/Nose/Throat: Hearing grossly intact, nares w/o erythema or drainage Eyes: PER, EOMI, sclera nonicteric.  Neck: Supple, no large masses.   Pulmonary:  Good air movement, no audible wheezing bilaterally, no use of accessory muscles.  Cardiac: RRR, no JVD Vascular: right carotid bruit Vessel Right Left  Radial Palpable Palpable  Ulnar Palpable Palpable  Brachial Palpable Palpable  Carotid Palpable Palpable  Gastrointestinal: Non-distended. No guarding/no peritoneal signs.  Musculoskeletal: M/S 5/5 throughout.  No deformity or atrophy.  Neurologic: CN 2-12 intact. Symmetrical.  Speech is fluent. Motor exam as listed above. Psychiatric: Judgment intact, Mood & affect appropriate for pt's clinical situation. Dermatologic: No rashes or ulcers noted.  No changes consistent with cellulitis. Lymph : No lichenification or skin changes of chronic lymphedema.  CBC Lab Results  Component Value Date   WBC 7.3 11/27/2017   HGB 12.5 11/27/2017   HCT 37.2 11/27/2017   MCV 90 11/27/2017   PLT 272 11/27/2017    BMET    Component Value Date/Time   NA 142 11/27/2017 0925   NA 142 09/30/2013 0434   K 4.1 11/27/2017 0925   K 3.8 09/30/2013 0434   CL 105 11/27/2017 0925   CL 110 (H) 09/30/2013 0434   CO2 21 11/27/2017 0925   CO2 25 09/30/2013 0434   GLUCOSE 131 (H) 11/27/2017 0925   GLUCOSE 119 (H) 06/21/2017 0957   GLUCOSE 108 (H) 09/30/2013 0434   BUN 23 11/27/2017 0925   BUN 22 (H) 09/30/2013 0434   CREATININE 1.51 (H) 11/27/2017 0925   CREATININE 1.29 09/30/2013 0434   CALCIUM 9.6 11/27/2017 0925   CALCIUM 8.7 09/30/2013 0434   GFRNONAA 33 (L) 11/27/2017 0925   GFRNONAA 43 (L) 09/30/2013 0434   GFRAA 38 (L) 11/27/2017 0925   GFRAA 52 (L) 09/30/2013 0434   CrCl cannot be calculated (Unknown ideal weight.).  COAG No results found for: INR, PROTIME  Radiology Nm Myocar Multi W/spect W/wall Motion / Ef  Result Date: 11/19/2017  Abnormal pharmacologic  myocardial perfusion stress test.  There is a moderate in size, moderate in severity, partially reversible inferolateral defect consistent with scar and mild peri-infarct ischemia.  The left ventricular ejection fraction is normal by visual estimation and Siemens calculation (57%). QGS ejection fraction is likely artifactually low due to gating parameters. There is inferolateral hypokinesis.  This is an intermediate risk study.      Assessment/Plan 1. Carotid artery stenosis with cerebral infarction [facility]) Recommend:  The patient remains asymptomatic with respect to the carotid stenosis.  However, the patient has now progressed and has a right carotid lesion the is >75%.  Patient's CT angiography of the carotid arteries confirms >75% right ICA stenosis.  The anatomical considerations support surgery over  stenting.  This was discussed in detail with the patient.  The patient does indeed need surgery, therefore, cardiac clearance is in progress. Once cleared the patient will be scheduled for surgery.  At present she is scheduled for cardiac cath on 12/19/2017  The risks, benefits and alternative therapies were reviewed in detail with the patient.  All questions were answered.  The patient agrees to proceed with surgery of the right carotid artery, if she requires intervention at the time of her cath then stenting of the carotid will be reviewed.  Continue antiplatelet therapy as prescribed. Continue management of CAD, HTN and Hyperlipidemia. Healthy heart diet, encouraged exercise at least 4 times per week.   2. Atherosclerosis of native coronary artery of native heart with stable angina pectoris [facility]) The patient does indeed need surgery, therefore, cardiac clearance is in progress. Once cleared the patient will be scheduled for surgery.  At present she is scheduled for cardiac cath on 12/19/2017  if she requires intervention at the time of her cath then stenting of the carotid will be  reviewed.   3. PAD (peripheral artery disease) (HCC)  Recommend:  The patient has evidence of atherosclerosis of the lower extremities with claudication.  The patient does not voice lifestyle limiting changes at this point in time.  Noninvasive studies do not suggest clinically significant change.  No invasive studies, angiography or surgery at this time The patient should continue walking and begin a more formal exercise program.  The patient should continue antiplatelet therapy and aggressive treatment of the lipid abnormalities  No changes in the patient's medications at this time  The patient should continue wearing graduated compression socks 10-15 mmHg strength to control the mild edema.    4. Essential hypertension Continue antihypertensive medications as already ordered, these medications have been reviewed and there are no changes at this time.   5. Type 2 diabetes mellitus with stage 2 chronic kidney disease, without long-term current use of insulin (HCC) Continue hypoglycemic medications as already ordered, these medications have been reviewed and there are no changes at this time.  Hgb A1C to be monitored as already arranged by primary service   6. Hyperlipidemia LDL goal <100 Continue statin as ordered and reviewed, no changes at this time     Hortencia Pilar, MD  12/12/2017 11:02 AM

## 2017-12-18 ENCOUNTER — Other Ambulatory Visit: Payer: Self-pay | Admitting: Cardiovascular Disease

## 2017-12-18 DIAGNOSIS — I200 Unstable angina: Secondary | ICD-10-CM

## 2017-12-19 ENCOUNTER — Ambulatory Visit
Admission: RE | Admit: 2017-12-19 | Discharge: 2017-12-19 | Disposition: A | Discharge status: Other Acute Inpatient Hospital | Payer: Medicare HMO | Attending: Cardiovascular Disease | Admitting: Cardiovascular Disease

## 2017-12-19 ENCOUNTER — Inpatient Hospital Stay (HOSPITAL_COMMUNITY)
Admission: AD | Admit: 2017-12-19 | Discharge: 2017-12-21 | DRG: 303 | Disposition: A | Discharge status: Home / Self Care | Payer: Medicare HMO | Source: Other Acute Inpatient Hospital | Attending: Cardiology | Admitting: Cardiology

## 2017-12-19 ENCOUNTER — Other Ambulatory Visit: Payer: Self-pay

## 2017-12-19 ENCOUNTER — Encounter
Admission: RE | Disposition: A | Discharge status: Other Acute Inpatient Hospital | Payer: Self-pay | Source: Home / Self Care | Attending: Cardiovascular Disease

## 2017-12-19 ENCOUNTER — Encounter: Payer: Self-pay | Admitting: *Deleted

## 2017-12-19 DIAGNOSIS — M1990 Unspecified osteoarthritis, unspecified site: Secondary | ICD-10-CM | POA: Insufficient documentation

## 2017-12-19 DIAGNOSIS — R9439 Abnormal result of other cardiovascular function study: Secondary | ICD-10-CM | POA: Diagnosis present

## 2017-12-19 DIAGNOSIS — N183 Chronic kidney disease, stage 3 unspecified: Secondary | ICD-10-CM | POA: Diagnosis present

## 2017-12-19 DIAGNOSIS — E1121 Type 2 diabetes mellitus with diabetic nephropathy: Secondary | ICD-10-CM | POA: Diagnosis present

## 2017-12-19 DIAGNOSIS — I200 Unstable angina: Secondary | ICD-10-CM

## 2017-12-19 DIAGNOSIS — Z79899 Other long term (current) drug therapy: Secondary | ICD-10-CM | POA: Insufficient documentation

## 2017-12-19 DIAGNOSIS — H9190 Unspecified hearing loss, unspecified ear: Secondary | ICD-10-CM | POA: Diagnosis present

## 2017-12-19 DIAGNOSIS — E1122 Type 2 diabetes mellitus with diabetic chronic kidney disease: Secondary | ICD-10-CM | POA: Diagnosis not present

## 2017-12-19 DIAGNOSIS — Z833 Family history of diabetes mellitus: Secondary | ICD-10-CM | POA: Insufficient documentation

## 2017-12-19 DIAGNOSIS — Z881 Allergy status to other antibiotic agents status: Secondary | ICD-10-CM | POA: Insufficient documentation

## 2017-12-19 DIAGNOSIS — Z7982 Long term (current) use of aspirin: Secondary | ICD-10-CM | POA: Diagnosis not present

## 2017-12-19 DIAGNOSIS — Z882 Allergy status to sulfonamides status: Secondary | ICD-10-CM | POA: Insufficient documentation

## 2017-12-19 DIAGNOSIS — I63239 Cerebral infarction due to unspecified occlusion or stenosis of unspecified carotid arteries: Secondary | ICD-10-CM | POA: Diagnosis not present

## 2017-12-19 DIAGNOSIS — Z87891 Personal history of nicotine dependence: Secondary | ICD-10-CM | POA: Insufficient documentation

## 2017-12-19 DIAGNOSIS — I2510 Atherosclerotic heart disease of native coronary artery without angina pectoris: Secondary | ICD-10-CM | POA: Diagnosis not present

## 2017-12-19 DIAGNOSIS — I10 Essential (primary) hypertension: Secondary | ICD-10-CM | POA: Diagnosis present

## 2017-12-19 DIAGNOSIS — E785 Hyperlipidemia, unspecified: Secondary | ICD-10-CM | POA: Insufficient documentation

## 2017-12-19 DIAGNOSIS — I6521 Occlusion and stenosis of right carotid artery: Secondary | ICD-10-CM | POA: Diagnosis not present

## 2017-12-19 DIAGNOSIS — I129 Hypertensive chronic kidney disease with stage 1 through stage 4 chronic kidney disease, or unspecified chronic kidney disease: Secondary | ICD-10-CM | POA: Diagnosis present

## 2017-12-19 DIAGNOSIS — E1151 Type 2 diabetes mellitus with diabetic peripheral angiopathy without gangrene: Secondary | ICD-10-CM | POA: Diagnosis present

## 2017-12-19 DIAGNOSIS — I25118 Atherosclerotic heart disease of native coronary artery with other forms of angina pectoris: Secondary | ICD-10-CM | POA: Diagnosis present

## 2017-12-19 DIAGNOSIS — R9430 Abnormal result of cardiovascular function study, unspecified: Secondary | ICD-10-CM

## 2017-12-19 DIAGNOSIS — Z7951 Long term (current) use of inhaled steroids: Secondary | ICD-10-CM

## 2017-12-19 DIAGNOSIS — Z888 Allergy status to other drugs, medicaments and biological substances status: Secondary | ICD-10-CM

## 2017-12-19 DIAGNOSIS — J449 Chronic obstructive pulmonary disease, unspecified: Secondary | ICD-10-CM | POA: Diagnosis present

## 2017-12-19 DIAGNOSIS — I4891 Unspecified atrial fibrillation: Secondary | ICD-10-CM | POA: Diagnosis present

## 2017-12-19 DIAGNOSIS — Z823 Family history of stroke: Secondary | ICD-10-CM | POA: Insufficient documentation

## 2017-12-19 DIAGNOSIS — Z9049 Acquired absence of other specified parts of digestive tract: Secondary | ICD-10-CM

## 2017-12-19 DIAGNOSIS — K219 Gastro-esophageal reflux disease without esophagitis: Secondary | ICD-10-CM | POA: Diagnosis present

## 2017-12-19 DIAGNOSIS — I739 Peripheral vascular disease, unspecified: Secondary | ICD-10-CM | POA: Diagnosis present

## 2017-12-19 DIAGNOSIS — Z90710 Acquired absence of both cervix and uterus: Secondary | ICD-10-CM

## 2017-12-19 DIAGNOSIS — R9431 Abnormal electrocardiogram [ECG] [EKG]: Secondary | ICD-10-CM | POA: Diagnosis present

## 2017-12-19 DIAGNOSIS — I6523 Occlusion and stenosis of bilateral carotid arteries: Secondary | ICD-10-CM | POA: Diagnosis not present

## 2017-12-19 DIAGNOSIS — I25110 Atherosclerotic heart disease of native coronary artery with unstable angina pectoris: Secondary | ICD-10-CM

## 2017-12-19 DIAGNOSIS — Z01818 Encounter for other preprocedural examination: Secondary | ICD-10-CM

## 2017-12-19 DIAGNOSIS — R54 Age-related physical debility: Secondary | ICD-10-CM | POA: Diagnosis present

## 2017-12-19 DIAGNOSIS — I69351 Hemiplegia and hemiparesis following cerebral infarction affecting right dominant side: Secondary | ICD-10-CM | POA: Diagnosis not present

## 2017-12-19 DIAGNOSIS — R4701 Aphasia: Secondary | ICD-10-CM | POA: Diagnosis present

## 2017-12-19 DIAGNOSIS — G8110 Spastic hemiplegia affecting unspecified side: Secondary | ICD-10-CM | POA: Diagnosis present

## 2017-12-19 DIAGNOSIS — Z01810 Encounter for preprocedural cardiovascular examination: Secondary | ICD-10-CM | POA: Diagnosis not present

## 2017-12-19 DIAGNOSIS — D509 Iron deficiency anemia, unspecified: Secondary | ICD-10-CM | POA: Diagnosis present

## 2017-12-19 DIAGNOSIS — I350 Nonrheumatic aortic (valve) stenosis: Secondary | ICD-10-CM | POA: Diagnosis present

## 2017-12-19 DIAGNOSIS — I361 Nonrheumatic tricuspid (valve) insufficiency: Secondary | ICD-10-CM | POA: Diagnosis not present

## 2017-12-19 HISTORY — DX: Gastro-esophageal reflux disease without esophagitis: K21.9

## 2017-12-19 HISTORY — PX: LEFT HEART CATH AND CORONARY ANGIOGRAPHY: CATH118249

## 2017-12-19 LAB — GLUCOSE, CAPILLARY
Glucose-Capillary: 111 mg/dL — ABNORMAL HIGH (ref 70–99)
Glucose-Capillary: 121 mg/dL — ABNORMAL HIGH (ref 70–99)

## 2017-12-19 LAB — CBC
HCT: 34.6 % — ABNORMAL LOW (ref 36.0–46.0)
HEMOGLOBIN: 11.3 g/dL — AB (ref 12.0–15.0)
MCH: 29.7 pg (ref 26.0–34.0)
MCHC: 32.7 g/dL (ref 30.0–36.0)
MCV: 91.1 fL (ref 80.0–100.0)
Platelets: 219 10*3/uL (ref 150–400)
RBC: 3.8 MIL/uL — ABNORMAL LOW (ref 3.87–5.11)
RDW: 12.2 % (ref 11.5–15.5)
WBC: 8.9 10*3/uL (ref 4.0–10.5)
nRBC: 0 % (ref 0.0–0.2)

## 2017-12-19 SURGERY — LEFT HEART CATH AND CORONARY ANGIOGRAPHY
Anesthesia: Moderate Sedation | Laterality: Left

## 2017-12-19 MED ORDER — NITROGLYCERIN 0.4 MG SL SUBL: 0.4000 mg | SUBLINGUAL_TABLET | SUBLINGUAL | Status: DC | PRN

## 2017-12-19 MED ORDER — ASPIRIN 81 MG PO CHEW: 81.0000 mg | CHEWABLE_TABLET | ORAL | Status: DC

## 2017-12-19 MED ORDER — SODIUM CHLORIDE 0.9 % IV SOLN: 250.0000 mL | INTRAVENOUS | Status: DC | PRN

## 2017-12-19 MED ORDER — SODIUM CHLORIDE 0.9 % WEIGHT BASED INFUSION: 1.0000 mL/kg/h | INTRAVENOUS | Status: DC

## 2017-12-19 MED ORDER — FENTANYL CITRATE (PF) 100 MCG/2ML IJ SOLN
INTRAMUSCULAR | Status: AC
  Filled 2017-12-19: qty 2

## 2017-12-19 MED ORDER — AMLODIPINE BESYLATE 5 MG PO TABS
5.0000 mg | ORAL_TABLET | Freq: Every day | ORAL | Status: DC
  Administered 2017-12-20 – 2017-12-21 (×2): 5 mg via ORAL
  Filled 2017-12-19 (×2): qty 1

## 2017-12-19 MED ORDER — ONDANSETRON HCL 4 MG/2ML IJ SOLN: 4.0000 mg | Freq: Four times a day (QID) | INTRAMUSCULAR | Status: DC | PRN

## 2017-12-19 MED ORDER — FLUTICASONE PROPIONATE 50 MCG/ACT NA SUSP
2.0000 | Freq: Every day | NASAL | Status: DC
  Administered 2017-12-20 – 2017-12-21 (×2): 2 via NASAL
  Filled 2017-12-19: qty 16

## 2017-12-19 MED ORDER — METOPROLOL TARTRATE 12.5 MG HALF TABLET
12.5000 mg | ORAL_TABLET | Freq: Two times a day (BID) | ORAL | Status: DC
  Administered 2017-12-19 – 2017-12-21 (×4): 12.5 mg via ORAL
  Filled 2017-12-19 (×4): qty 1

## 2017-12-19 MED ORDER — ASPIRIN EC 81 MG PO TBEC
81.0000 mg | DELAYED_RELEASE_TABLET | Freq: Every day | ORAL | Status: DC
  Administered 2017-12-20 – 2017-12-21 (×2): 81 mg via ORAL
  Filled 2017-12-19 (×2): qty 1

## 2017-12-19 MED ORDER — FENTANYL CITRATE (PF) 100 MCG/2ML IJ SOLN
INTRAMUSCULAR | Status: DC | PRN
  Administered 2017-12-19: 25 ug via INTRAVENOUS

## 2017-12-19 MED ORDER — HYDRALAZINE HCL 20 MG/ML IJ SOLN
INTRAMUSCULAR | Status: AC
  Administered 2017-12-19: 10 mg via INTRAVENOUS
  Filled 2017-12-19: qty 1

## 2017-12-19 MED ORDER — IOPAMIDOL (ISOVUE-300) INJECTION 61%
INTRAVENOUS | Status: DC | PRN
  Administered 2017-12-19: 95 mL via INTRA_ARTERIAL

## 2017-12-19 MED ORDER — AMITRIPTYLINE HCL 10 MG PO TABS
10.0000 mg | ORAL_TABLET | Freq: Every evening | ORAL | Status: DC | PRN
  Administered 2017-12-19 – 2017-12-20 (×2): 10 mg via ORAL
  Filled 2017-12-19 (×3): qty 1

## 2017-12-19 MED ORDER — FAMOTIDINE 20 MG PO TABS
20.0000 mg | ORAL_TABLET | Freq: Every day | ORAL | Status: DC
  Administered 2017-12-20 – 2017-12-21 (×2): 20 mg via ORAL
  Filled 2017-12-19 (×2): qty 1

## 2017-12-19 MED ORDER — HEPARIN (PORCINE) 25000 UT/250ML-% IV SOLN
700.0000 [IU]/h | INTRAVENOUS | Status: DC
  Administered 2017-12-19 – 2017-12-21 (×2): 700 [IU]/h via INTRAVENOUS
  Filled 2017-12-19 (×2): qty 250

## 2017-12-19 MED ORDER — HEPARIN (PORCINE) IN NACL 1000-0.9 UT/500ML-% IV SOLN
INTRAVENOUS | Status: AC
  Filled 2017-12-19: qty 1000

## 2017-12-19 MED ORDER — SODIUM CHLORIDE 0.9% FLUSH: 3.0000 mL | Freq: Two times a day (BID) | INTRAVENOUS | Status: DC

## 2017-12-19 MED ORDER — DOXAZOSIN MESYLATE 4 MG PO TABS
2.0000 mg | ORAL_TABLET | Freq: Every day | ORAL | Status: DC
  Administered 2017-12-19 – 2017-12-20 (×2): 2 mg via ORAL
  Filled 2017-12-19 (×2): qty 1

## 2017-12-19 MED ORDER — VITAMIN B-12 1000 MCG PO TABS
1000.0000 ug | ORAL_TABLET | Freq: Every day | ORAL | Status: DC
  Administered 2017-12-20 – 2017-12-21 (×2): 1000 ug via ORAL
  Filled 2017-12-19 (×2): qty 1

## 2017-12-19 MED ORDER — METOPROLOL TARTRATE 25 MG PO TABS
12.5000 mg | ORAL_TABLET | Freq: Once | ORAL | Status: AC
  Administered 2017-12-19: 12.5 mg via ORAL
  Filled 2017-12-19: qty 0.5

## 2017-12-19 MED ORDER — SODIUM CHLORIDE 0.9% FLUSH: 3.0000 mL | INTRAVENOUS | Status: DC | PRN

## 2017-12-19 MED ORDER — HYDRALAZINE HCL 20 MG/ML IJ SOLN
10.0000 mg | Freq: Once | INTRAMUSCULAR | Status: AC
  Administered 2017-12-19: 10 mg via INTRAVENOUS

## 2017-12-19 MED ORDER — ASPIRIN 81 MG PO CHEW
CHEWABLE_TABLET | ORAL | Status: AC
  Filled 2017-12-19: qty 1

## 2017-12-19 MED ORDER — ATORVASTATIN CALCIUM 80 MG PO TABS
80.0000 mg | ORAL_TABLET | Freq: Every day | ORAL | Status: DC
  Administered 2017-12-19 – 2017-12-20 (×2): 80 mg via ORAL
  Filled 2017-12-19 (×2): qty 1

## 2017-12-19 MED ORDER — CALCITRIOL 0.25 MCG PO CAPS
0.2500 ug | ORAL_CAPSULE | Freq: Every day | ORAL | Status: DC
  Administered 2017-12-20 – 2017-12-21 (×2): 0.25 ug via ORAL
  Filled 2017-12-19 (×2): qty 1

## 2017-12-19 MED ORDER — ACETAMINOPHEN 325 MG PO TABS: 650.0000 mg | ORAL_TABLET | ORAL | Status: DC | PRN

## 2017-12-19 MED ORDER — HYDRALAZINE HCL 50 MG PO TABS
50.0000 mg | ORAL_TABLET | Freq: Three times a day (TID) | ORAL | Status: DC
  Administered 2017-12-19 – 2017-12-21 (×5): 50 mg via ORAL
  Filled 2017-12-19 (×5): qty 1

## 2017-12-19 MED ORDER — LOSARTAN POTASSIUM 50 MG PO TABS
100.0000 mg | ORAL_TABLET | Freq: Every day | ORAL | Status: DC
  Administered 2017-12-20 – 2017-12-21 (×2): 100 mg via ORAL
  Filled 2017-12-19 (×2): qty 2

## 2017-12-19 MED ORDER — MIDAZOLAM HCL 2 MG/2ML IJ SOLN
INTRAMUSCULAR | Status: DC | PRN
  Administered 2017-12-19: 1 mg via INTRAVENOUS

## 2017-12-19 MED ORDER — SODIUM CHLORIDE 0.9 % WEIGHT BASED INFUSION
3.0000 mL/kg/h | INTRAVENOUS | Status: AC
  Administered 2017-12-19: 3 mL/kg/h via INTRAVENOUS

## 2017-12-19 MED ORDER — LORATADINE 10 MG PO TABS
10.0000 mg | ORAL_TABLET | Freq: Every day | ORAL | Status: DC
  Administered 2017-12-20 – 2017-12-21 (×2): 10 mg via ORAL
  Filled 2017-12-19 (×2): qty 1

## 2017-12-19 MED ORDER — MIDAZOLAM HCL 2 MG/2ML IJ SOLN
INTRAMUSCULAR | Status: AC
  Filled 2017-12-19: qty 2

## 2017-12-19 MED ORDER — ASPIRIN 81 MG PO CHEW
CHEWABLE_TABLET | ORAL | Status: DC | PRN
  Administered 2017-12-19: 81 mg via ORAL

## 2017-12-19 SURGICAL SUPPLY — 11 items
CANNULA 5F STIFF (CANNULA) ×2 IMPLANT
CATH INFINITI 5FR ANG PIGTAIL (CATHETERS) ×2 IMPLANT
CATH INFINITI 5FR JL4 (CATHETERS) ×2 IMPLANT
CATH INFINITI JR4 5F (CATHETERS) ×2 IMPLANT
KIT MANI 3VAL PERCEP (MISCELLANEOUS) ×2 IMPLANT
NEEDLE PERC 18GX7CM (NEEDLE) ×2 IMPLANT
NEEDLE SMART 18G ACCESS (NEEDLE) ×2 IMPLANT
PACK CARDIAC CATH (CUSTOM PROCEDURE TRAY) ×2 IMPLANT
SHEATH AVANTI 5FR X 11CM (SHEATH) ×2 IMPLANT
WIRE GUIDERIGHT .035X150 (WIRE) ×2 IMPLANT
WIRE HITORQ VERSACORE ST 145CM (WIRE) ×2 IMPLANT

## 2017-12-19 NOTE — Progress Notes (Addendum)
Pt BP increasing MD text paged at [phone] and paged via tradition pager [phone] Awaiting call back. PA Elenor Quinones text paged x 1 at [phone]. Call back received and ordered placed for metoprolol 12.5 po x 1 now. Awaitng med from pharmacy.

## 2017-12-19 NOTE — Progress Notes (Addendum)
Call back received per Dr. Rockey Situ at [phone]. Orders received to give lopressor and IV hydralazine for HTN. Meds given, [facility] at bedside.

## 2017-12-19 NOTE — Progress Notes (Signed)
ANTICOAGULATION CONSULT NOTE - Initial Consult  Pharmacy Consult for IV heparin Indication: CAD awaiting CABG workup  Allergies  Allergen Reactions  . Ace Inhibitors     Renal insufficiency  . Augmentin [Amoxicillin-Pot Clavulanate] Itching    Has patient had a PCN reaction causing immediate rash, facial/tongue/throat swelling, SOB or lightheadedness with hypotension: No Has patient had a PCN reaction causing severe rash involving mucus membranes or skin necrosis: No Has patient had a PCN reaction that required hospitalization: No Has patient had a PCN reaction occurring within the last 10 years: No If all of the above answers are "NO", then may proceed with Cephalosporin use.   . Sulfa Antibiotics Itching    Patient Measurements:   Heparin Dosing Weight: 57.2 kg  Vital Signs: Temp: 97.9 F (36.6 C) (12/19 1600) Temp Source: Oral (12/19 1600) BP: 122/86 (12/19 1600) Pulse Rate: 86 (12/19 1600)  Labs: No results for input(s): HGB, HCT, PLT, APTT, LABPROT, INR, HEPARINUNFRC, HEPRLOWMOCWT, CREATININE, CKTOTAL, CKMB, TROPONINI in the last 72 hours.  CrCl cannot be calculated (Patient's most recent lab result is older than the maximum 21 days allowed.).   Medical History: Past Medical History:  Diagnosis Date  . Anemia   . Arthritis   . Carotid artery occlusion    Right side  . Chronic kidney disease   . Depression   . Diabetes mellitus without complication ([place])   . Diverticulitis   . GERD (gastroesophageal reflux disease)   . HTN (hypertension)   . Hyperlipidemia   . Stroke ([place]) Sept-Oct 2015  . Urine incontinence     Medications:  Scheduled:  . [START ON 12/20/2017] amLODipine  5 mg Oral Daily  . [START ON 12/20/2017] aspirin EC  81 mg Oral Daily  . atorvastatin  80 mg Oral q1800  . [START ON 12/20/2017] calcitRIOL  0.25 mcg Oral Daily  . doxazosin  2 mg Oral QHS  . [START ON 12/20/2017] famotidine  20 mg Oral Daily  . [START ON 12/20/2017] fluticasone  2  spray Each Nare Daily  . hydrALAZINE  50 mg Oral TID  . [START ON 12/20/2017] loratadine  10 mg Oral Daily  . [START ON 12/20/2017] losartan  100 mg Oral Daily  . metoprolol tartrate  12.5 mg Oral BID  . [START ON 12/20/2017] vitamin B-12  1,000 mcg Oral Daily    Assessment: 77 yo female who transferred to [facility] for potential CABG.  S/p cath at [facility] this afternoon.  Pharmacy asked to start IV heparin 8 hrs after sheath out (removed 1115 AM).  No bleeding or complications noted.  Goal of Therapy:  Heparin level 0.3-0.7 units/ml Monitor platelets by anticoagulation protocol: Yes   Plan:  Labs pending Start IV Heparin 8 hrs after sheath out,  will start heparin now at 700 units/hr.   Check heparin level in 8 hrs Daily heparin level and CBC. F/u plans for CABG.  Marguerite Olea, [facility] Clinical Pharmacist Phone [phone]  12/19/2017 7:57 PM

## 2017-12-19 NOTE — H&P (Signed)
H&P Addendum, precardiac catheterization  Patient was seen and evaluated prior to Cardiac catheterization procedure Symptoms, prior testing details again confirmed with the patient Patient examined, no significant change from prior exam Lab work reviewed in detail personally by myself Patient understands risk and benefit of the procedure, willing to proceed  Signed, Tim Jazell Rosenau, MD, Ph.D [facility]    

## 2017-12-19 NOTE — Progress Notes (Signed)
Pt placed on [facility] stretcher and en route to [facility] at this time. Family with belongings

## 2017-12-19 NOTE — H&P (Signed)
Cardiology Admission History and Physical:   Patient ID: Candace Monroe MRN: [id number]; DOB: 03-18-1940   Admission date: (Not on file)  Primary Care Provider: Crecencio Mc, MD Primary Cardiologist: Dr. Rockey Situ, [facility] Primary Electrophysiologist:  None   Chief Complaint: CAD with evaluation needed for CABG, Carotid Artery Stenosis s/p 2015 CVA  Patient Profile:   Candace Monroe is a 77 y.o. 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  History of Present Illness:   Candace Monroe is a 77 year old female with complex PMH as above and s/p stroke due to carotid artery stenosis.    11/14/2017: Seen at [place] office for preoperative Cardiovascular evaluation prior to carotid enterectomy.  At that time, she had an abnormal EKG, concerning for old anterior MI.  Given the high risk of her planned carotid surgery in December 2019 by [facility] vascular surgery, further ischemic work-up with stress test was planned at that time.  Patient was unable to treadmill as she ambulated with a walker, so [place] study was scheduled for further risk stratification.  11/19/2017: Myoview Lexiscan was completed and ruled abnormal / intermediate risk study and positive for cardiac ischemia. A moderate in size, moderate in severity, partially reversible inferolateral defect that was consistent with scar and mild peri-infarct ischemia was visualized on images.  The left ventricular fraction was normal by visual estimation and symptoms calculation (57%).  QGS ejection fraction was thought likely artifactually low due to gating parameters.  There was inferolateral hypokinesis.  Given her abnormal stress  test, outpatient LHC was scheduled for further preop evaluation prior to carotid endartherectomy.  12/19/2017: Outpatient left heart cardiac catheterization was performed and showed moderate to severe left main, critical ostial LAD, and mid LAD disease as well as severe mid circumflex and moderate ostial circumflex disease. Axis PCI/ intervention was felt to be very challenging given her left main and ostial LAD stenosis and preference was therefore for consideration of CABG at [facility].  Also needing considered was her management/surgery for severe carotid artery disease.  Given the severity of the ostial LAD disease and left main disease, recommendation was therefore made for transfer of patient directly from [facility] to [facility].    Past Medical History:  Diagnosis Date  . Anemia   . Arthritis   . Carotid artery occlusion    Right side  . Chronic kidney disease   . Depression   . Diabetes mellitus without complication ([place])   . Diverticulitis   . GERD (gastroesophageal reflux disease)   . HTN (hypertension)   . Hyperlipidemia   . Stroke ([place]) Sept-Oct 2015  . Urine incontinence     Past Surgical History:  Procedure Laterality Date  . ABDOMINAL HYSTERECTOMY  1973  . CHOLECYSTECTOMY  1990  . COLONOSCOPY WITH PROPOFOL N/A 07/16/2017   Procedure: COLONOSCOPY WITH PROPOFOL;  Surgeon: Lucilla Lame, MD;  Location[facility] ENDOSCOPY;  Service: Endoscopy;  Laterality: N/A;  . TONSILLECTOMY       Medications Prior to Admission: Prior to Admission medications   Medication Sig Start Date End Date Taking? Authorizing Provider  amitriptyline (ELAVIL) 10 MG tablet TAKE 1 TABLET BY MOUTH EVERY DAY AT BEDTIME AS NEEDED FOR INSOMNIA Patient taking differently: Take 10 mg by mouth at  bedtime as needed for sleep.  11/18/17   Crecencio Mc, MD  amLODipine (NORVASC) 10 MG tablet TAKE 1 TABLET BY MOUTH EVERY DAY 08/05/17   Crecencio Mc, MD  aspirin 81 MG tablet Take 81 mg by mouth daily.     [provider]  atorvastatin (LIPITOR) 20 MG tablet TAKE 1 TABLET BY MOUTH EVERY DAY Patient not taking: Reported on 12/19/2017 11/26/17   Crecencio Mc, MD  blood glucose meter kit and supplies Please dispense One Touch meter, E11.22 03/06/16   Crecencio Mc, MD  calcitRIOL (ROCALTROL) 0.25 MCG capsule Take 0.25 mcg by mouth daily.    [provider]  [employer] VITAMIN B12 1000 MCG tablet TAKE 1 TABLET BY MOUTH EVERY DAY 08/19/17   Sindy Guadeloupe, MD  doxazosin (CARDURA) 2 MG tablet TAKE 1 TABLET BY MOUTH EVERY DAY Patient taking differently: Take 2 mg by mouth at bedtime.  08/07/17   Crecencio Mc, MD  famotidine (PEPCID) 20 MG tablet TAKE 1 TABLET BY MOUTH EVERY DAY Patient taking differently: Take 20 mg by mouth daily as needed for heartburn.  08/05/17   Crecencio Mc, MD  fexofenadine (ALLEGRA) 180 MG tablet TAKE 1 TABLET BY MOUTH EVERY DAY 08/20/17   Crecencio Mc, MD  fluticasone (FLONASE) 50 MCG/ACT nasal spray Place 2 sprays into both nostrils daily as needed for allergies.  08/26/13   [provider]  glucose blood test strip Use as instructed, one touch to check blood glucose up to 3 times a day. E11.22 06/21/17   Crecencio Mc, MD  hydrALAZINE (APRESOLINE) 50 MG tablet Take 50 mg by mouth 3 (three) times daily.    [provider]  Lancets (FREESTYLE) lancets USE AS DIRECTED ONCE DAILY 01/26/16   Crecencio Mc, MD  losartan (COZAAR) 100 MG tablet Take 1 tablet (100 mg total) by mouth daily. Patient not taking: Reported on 12/19/2017 01/14/17   Crecencio Mc, MD     Allergies:    Allergies  Allergen Reactions  . Ace Inhibitors     Renal insufficiency  . Augmentin [Amoxicillin-Pot Clavulanate] Itching    Has patient had a PCN reaction causing immediate rash, facial/tongue/throat swelling, SOB or lightheadedness with hypotension: No Has patient had a PCN reaction causing severe rash involving mucus membranes or skin necrosis: No Has patient had a  PCN reaction that required hospitalization: No Has patient had a PCN reaction occurring within the last 10 years: No If all of the above answers are "NO", then may proceed with Cephalosporin use.   . Sulfa Antibiotics Itching    Social History:   Social History   Socioeconomic History  . Marital status: Married    Spouse name: Not on file  . Number of children: Not on file  . Years of education: Not on file  . Highest education level: Not on file  Occupational History  . Not on file  Social Needs  . Financial resource strain: Not hard at all  . Food insecurity:    Worry: Never true    Inability: Never true  . Transportation needs:    Medical: No    Non-medical: No  Tobacco Use  . Smoking status: Former Smoker    Years: 50.00    Last attempt to quit: 09/01/2013    Years since quitting: 4.3  . Smokeless tobacco: Never Used  Substance and Sexual Activity  . Alcohol use: No    Alcohol/week: 0.0 standard drinks  .  Drug use: No  . Sexual activity: Never  Lifestyle  . Physical activity:    Days per week: Not on file    Minutes per session: Not on file  . Stress: Not on file  Relationships  . Social connections:    Talks on phone: Not on file    Gets together: Not on file    Attends religious service: Not on file    Active member of club or organization: Not on file    Attends meetings of clubs or organizations: Not on file    Relationship status: Not on file  . Intimate partner violence:    Fear of current or ex partner: No    Emotionally abused: No    Physically abused: No    Forced sexual activity: No  Other Topics Concern  . Not on file  Social History Narrative  . Not on file    Family History:   The patient's family history includes Cancer (age of onset: 69) in her sister; Diabetes in her brother and son; Seizures in her sister; Stroke in her mother.    ROS:  Please see the history of present illness.  Review of Systems  Respiratory: Positive for  shortness of breath.   Cardiovascular: Negative for chest pain.  Musculoskeletal: Positive for joint pain.  Neurological: Positive for weakness.       Leg and right sided weakness, high fall risk    All other ROS reviewed and negative.     Physical Exam/Data:  There were no vitals filed for this visit. No intake or output data in the 24 hours ending 12/19/17 1345 There were no vitals filed for this visit. There is no height or weight on file to calculate BMI.  General:  Well nourished, well developed, in no acute distress HEENT: normal Neck: no JVD Vascular: R carotid bruit Cardiac:  RRR; no murmur  Lungs:  clear to auscultation bilaterally, no wheezing, rhonchi or rales  Abd: soft, nontender, no hepatomegaly  Ext: no edema Musculoskeletal:  No deformities, ambulates with walker Skin: warm and dry  Psych:  Normal affect    EKG:  The ECG that was done is not available per EMR  Relevant CV Studies:  [facility] 12/19/2017  Prox Cx lesion is 95% stenosed.  Mid LAD lesion is 90% stenosed.  Ost LAD lesion is 99% stenosed.  Mid LM to Dist LM lesion is 75% stenosed.  Ost Cx lesion is 60% stenosed.  Mid Cx lesion is 90% stenosed.  The left ventricular ejection fraction is 55-65% by visual estimate.    NM Study 12/19/2017  Abnormal pharmacologic myocardial perfusion stress test.  There is a moderate in size, moderate in severity, partially reversible inferolateral defect consistent with scar and mild peri-infarct ischemia.  The left ventricular ejection fraction is normal by visual estimation and Siemens calculation (57%). QGS ejection fraction is likely artifactually low due to gating parameters. There is inferolateral hypokinesis.  This is an intermediate risk study.      Laboratory Data:  ChemistryNo results for input(s): NA, K, CL, CO2, GLUCOSE, BUN, CREATININE, CALCIUM, GFRNONAA, GFRAA, ANIONGAP in the last 168 hours.  No results for input(s): PROT, ALBUMIN, AST,  ALT, ALKPHOS, BILITOT in the last 168 hours. HematologyNo results for input(s): WBC, RBC, HGB, HCT, MCV, MCH, MCHC, RDW, PLT in the last 168 hours. Cardiac EnzymesNo results for input(s): TROPONINI in the last 168 hours. No results for input(s): TROPIPOC in the last 168 hours.  BNPNo results for input(s):  BNP, PROBNP in the last 168 hours.  DDimer No results for input(s): DDIMER in the last 168 hours.  Radiology/Studies:  No results found.  Assessment and Plan:   CAD - No current chest pain. Pending upload of most recent EKG to EMR.  - Risk factors include PAD and previous smoker with known RICA stenosis.  - Seen in November 2019 for preoperative evaluation for planned carotid endarterectomy. Preop EKG abnormal and subsequent Myoview stress test positive for ischemia. LHC performed 12/19 as above with need for intervention and transfer to [facility] for possible CABG and in setting of severe carotid artery stenosis with need for surgery.  - Will need heparin started 8h s/p sheath removal. Continue medical management with ASA 60m daily. Started on low dose BB with metoprolol 12.5 mg BID and decreased amlodipine. Increased atorvastatin to 818mdaily given cath results. Continue losartan and hydralazine.  - Transfer was set up through [facility] EMTALA form completed with plan to start on heparin drip 8 hours s/p pulling sheath.   Cartotid artery stenosis with cerebral infarction 2015 - Pending open carotid endarterectomy with Dr. ScRonalee Belts- ASA - continue  HTN - Continue medical management as above - Monitor vitals  HLD - Update lipid panel - Increased statin therapy to atorvastatin 8072maily given the LHC findings as above  DM2 with CKD - Update A1C - Started on heart healthy and carb modified diet. Will be NPO after midnight in preparation for possible intervention. - No PTA medication of record      Severity of Illness: The appropriate patient status for this patient is  INPATIENT. Inpatient status is judged to be reasonable and necessary in order to provide the required intensity of service to ensure the patient's safety. The patient's presenting symptoms, physical exam findings, and initial radiographic and laboratory data in the context of their chronic comorbidities is felt to place them at high risk for further clinical deterioration. Furthermore, it is not anticipated that the patient will be medically stable for discharge from the hospital within 2 midnights of admission. The following factors support the patient status of inpatient.   " The patient's presenting symptoms include CAD. " The worrisome physical exam findings includeCAD " The initial radiographic and laboratory data are worrisome because of CAD " The chronic co-morbidities include Carotid artery stenosis   * I certify that at the point of admission it is my clinical judgment that the patient will require inpatient hospital care spanning beyond 2 midnights from the point of admission due to high intensity of service, high risk for further deterioration and high frequency of surveillance required.*    For questions or updates, please contact [facility] consult [email or web address] for contact info under        Signed, JacArvil ChacoA-C  12/19/2017 1:45 PM

## 2017-12-20 ENCOUNTER — Inpatient Hospital Stay (HOSPITAL_COMMUNITY): Payer: Medicare HMO

## 2017-12-20 ENCOUNTER — Other Ambulatory Visit: Payer: Self-pay

## 2017-12-20 ENCOUNTER — Other Ambulatory Visit: Payer: Self-pay | Admitting: *Deleted

## 2017-12-20 ENCOUNTER — Encounter (HOSPITAL_COMMUNITY): Payer: Self-pay | Admitting: *Deleted

## 2017-12-20 DIAGNOSIS — I25110 Atherosclerotic heart disease of native coronary artery with unstable angina pectoris: Secondary | ICD-10-CM

## 2017-12-20 DIAGNOSIS — I361 Nonrheumatic tricuspid (valve) insufficiency: Secondary | ICD-10-CM

## 2017-12-20 DIAGNOSIS — I2510 Atherosclerotic heart disease of native coronary artery without angina pectoris: Secondary | ICD-10-CM

## 2017-12-20 DIAGNOSIS — I6521 Occlusion and stenosis of right carotid artery: Secondary | ICD-10-CM

## 2017-12-20 DIAGNOSIS — Z01810 Encounter for preprocedural cardiovascular examination: Secondary | ICD-10-CM

## 2017-12-20 LAB — CBC
HCT: 33.4 % — ABNORMAL LOW (ref 36.0–46.0)
HEMOGLOBIN: 11 g/dL — AB (ref 12.0–15.0)
MCH: 30.3 pg (ref 26.0–34.0)
MCHC: 32.9 g/dL (ref 30.0–36.0)
MCV: 92 fL (ref 80.0–100.0)
Platelets: 226 10*3/uL (ref 150–400)
RBC: 3.63 MIL/uL — AB (ref 3.87–5.11)
RDW: 12.1 % (ref 11.5–15.5)
WBC: 6.7 10*3/uL (ref 4.0–10.5)
nRBC: 0 % (ref 0.0–0.2)

## 2017-12-20 LAB — LIPID PANEL
Cholesterol: 113 mg/dL (ref 0–200)
HDL: 35 mg/dL — AB (ref >40)
LDL Cholesterol: 62 mg/dL (ref 0–99)
TRIGLYCERIDES: 80 mg/dL (ref <150)
Total CHOL/HDL Ratio: 3.2 RATIO
VLDL: 16 mg/dL (ref 0–40)

## 2017-12-20 LAB — GLUCOSE, CAPILLARY
GLUCOSE-CAPILLARY: 95 mg/dL (ref 70–99)
Glucose-Capillary: 117 mg/dL — ABNORMAL HIGH (ref 70–99)
Glucose-Capillary: 112 mg/dL — ABNORMAL HIGH (ref 70–99)
Glucose-Capillary: 138 mg/dL — ABNORMAL HIGH (ref 70–99)

## 2017-12-20 LAB — BASIC METABOLIC PANEL
Anion gap: 8 (ref 5–15)
BUN: 20 mg/dL (ref 8–23)
CO2: 23 mmol/L (ref 22–32)
CREATININE: 1.49 mg/dL — AB (ref 0.44–1.00)
Calcium: 8.6 mg/dL — ABNORMAL LOW (ref 8.9–10.3)
Chloride: 112 mmol/L — ABNORMAL HIGH (ref 98–111)
GFR calc Af Amer: 38 mL/min — ABNORMAL LOW (ref >60)
GFR calc non Af Amer: 33 mL/min — ABNORMAL LOW (ref >60)
Glucose, Bld: 106 mg/dL — ABNORMAL HIGH (ref 70–99)
Potassium: 3.9 mmol/L (ref 3.5–5.1)
Sodium: 143 mmol/L (ref 135–145)

## 2017-12-20 LAB — PULMONARY FUNCTION TEST
FEF 25-75 Pre: 0.56 L/sec
FEF2575-%Pred-Pre: 49 %
FEV1-%Pred-Pre: 76 %
FEV1-Pre: 1.09 L
FEV1FVC-%Pred-Pre: 86 %
FEV6-%Pred-Pre: 90 %
FEV6-Pre: 1.65 L
FEV6FVC-%Pred-Pre: 103 %
FVC-%Pred-Pre: 87 %
FVC-Pre: 1.71 L
Pre FEV1/FVC ratio: 64 %
Pre FEV6/FVC Ratio: 97 %

## 2017-12-20 LAB — HEPARIN LEVEL (UNFRACTIONATED)
HEPARIN UNFRACTIONATED: 0.38 [IU]/mL (ref 0.30–0.70)
Heparin Unfractionated: 0.42 IU/mL (ref 0.30–0.70)

## 2017-12-20 LAB — HEMOGLOBIN A1C
Hgb A1c MFr Bld: 5.1 % (ref 4.8–5.6)
Mean Plasma Glucose: 99.67 mg/dL

## 2017-12-20 LAB — ECHOCARDIOGRAM COMPLETE: Weight: 2016 oz

## 2017-12-20 MED ORDER — ALBUTEROL SULFATE (2.5 MG/3ML) 0.083% IN NEBU
2.5000 mg | INHALATION_SOLUTION | Freq: Once | RESPIRATORY_TRACT | Status: AC
  Administered 2017-12-20: 2.5 mg via RESPIRATORY_TRACT

## 2017-12-20 NOTE — Progress Notes (Signed)
Progress Note  Patient Name: Candace Monroe Date of Encounter: 12/20/2017  Primary Cardiologist: No primary care provider on file.   Subjective   No chest pain or shortness of breath.  Feeling fine today.  Very hard of hearing makes communication difficult.  Many family members are at the bedside.  Inpatient Medications    Scheduled Meds: . amLODipine  5 mg Oral Daily  . aspirin EC  81 mg Oral Daily  . atorvastatin  80 mg Oral q1800  . calcitRIOL  0.25 mcg Oral Daily  . doxazosin  2 mg Oral QHS  . famotidine  20 mg Oral Daily  . fluticasone  2 spray Each Nare Daily  . hydrALAZINE  50 mg Oral TID  . loratadine  10 mg Oral Daily  . losartan  100 mg Oral Daily  . metoprolol tartrate  12.5 mg Oral BID  . vitamin B-12  1,000 mcg Oral Daily   Continuous Infusions: . heparin 700 Units/hr (12/19/17 2012)   PRN Meds: acetaminophen, amitriptyline, nitroGLYCERIN, ondansetron (ZOFRAN) IV   Vital Signs    Vitals:   12/19/17 2026 12/20/17 0018 12/20/17 0335 12/20/17 0730  BP: (!) 142/58 130/61 (!) 114/54 133/63  Pulse: 83 74 67 68  Resp: (!) 22 20 20 18   Temp: 98.3 F (36.8 C) 98.3 F (36.8 C) 97.9 F (36.6 C) 97.7 F (36.5 C)  TempSrc: Oral Oral Oral Oral  SpO2: 94% 97% 95% 96%    Intake/Output Summary (Last 24 hours) at 12/20/2017 1040 Last data filed at 12/20/2017 0900 Gross per 24 hour  Intake 68.47 ml  Output -  Net 68.47 ml   There were no vitals filed for this visit.  Telemetry    Normal sinus rhythm without significant arrhythmia- Personally Reviewed  ECG    Pending  Physical Exam  Elderly woman, hard of hearing GEN: No acute distress.   Neck: No JVD Cardiac: RRR, 2/6 systolic ejection murmur at the right upper sternal border Respiratory: Clear to auscultation bilaterally. GI: Soft, nontender, non-distended  MS: No edema; No deformity. Neuro:  Nonfocal  Psych: Normal affect   Labs    Chemistry Recent Labs  Lab 12/20/17 0334  NA 143  K  3.9  CL 112*  CO2 23  GLUCOSE 106*  BUN 20  CREATININE 1.49*  CALCIUM 8.6*  GFRNONAA 33*  GFRAA 38*  ANIONGAP 8     Hematology Recent Labs  Lab 12/19/17 2033 12/20/17 0334  WBC 8.9 6.7  RBC 3.80* 3.63*  HGB 11.3* 11.0*  HCT 34.6* 33.4*  MCV 91.1 92.0  MCH 29.7 30.3  MCHC 32.7 32.9  RDW 12.2 12.1  PLT 219 226    Cardiac EnzymesNo results for input(s): TROPONINI in the last 168 hours. No results for input(s): TROPIPOC in the last 168 hours.   BNPNo results for input(s): BNP, PROBNP in the last 168 hours.   DDimer No results for input(s): DDIMER in the last 168 hours.   Radiology    No results found.  Cardiac Studies   Cardiac catheterization study reviewed.  Conclusion    Prox Cx lesion is 95% stenosed.  Mid LAD lesion is 90% stenosed.  Ost LAD lesion is 99% stenosed.  Mid LM to Dist LM lesion is 75% stenosed.  Ost Cx lesion is 60% stenosed.  Mid Cx lesion is 90% stenosed.  The left ventricular ejection fraction is 55-65% by visual estimate.     Patient Profile     77 y.o. female  with critical multivessel coronary artery disease and severe right common carotid artery stenosis, transferred from [facility] for surgical evaluation.  Assessment & Plan    The patient's case is reviewed.  She underwent cardiac catheterization yesterday because of an abnormal stress Myoview scan prior to carotid surgery.  She really does not have anginal symptoms or significant shortness of breath, but she has a very low functional capacity.  The patient ambulates with a walker.  She does not get out of the house and do much.  She has very severe coronary artery disease with heavy calcification and severe stenosis involving the distal left mainstem extending into the ostium of the LAD.  She also has multivessel disease involving the circumflex.  LVEF is well-preserved.  She is now transferred on IV heparin because of high risk anatomy for surgical  evaluation.  Her case is somewhat complex based on her advanced age, limited functional capacity, previous stroke, and severe carotid stenosis.  She will undergo cardiac surgical and vascular surgical consultation in the hospital with plans to proceed pending their evaluation.  I discussed all of this with her family at length.  We reviewed potential treatment options.  We will continue IV heparin and medical therapy while she awaits surgical evaluation.  For questions or updates, please contact [place] Please consult [email or web address] for contact info under        Signed, Sherren Mocha, MD  12/20/2017, 10:40 AM

## 2017-12-20 NOTE — Progress Notes (Signed)
Pre-CABG testing has been completed. Preliminary results can be found in CV Proc through chart review.   12/20/17 4:55 PM Candace Monroe RVT

## 2017-12-20 NOTE — Progress Notes (Addendum)
[place] for IV heparin Indication: CAD awaiting CABG workup  Allergies  Allergen Reactions  . Ace Inhibitors     Renal insufficiency  . Augmentin [Amoxicillin-Pot Clavulanate] Itching    Has patient had a PCN reaction causing immediate rash, facial/tongue/throat swelling, SOB or lightheadedness with hypotension: No Has patient had a PCN reaction causing severe rash involving mucus membranes or skin necrosis: No Has patient had a PCN reaction that required hospitalization: No Has patient had a PCN reaction occurring within the last 10 years: No If all of the above answers are "NO", then may proceed with Cephalosporin use.   . Sulfa Antibiotics Itching    Patient Measurements: Heparin Dosing Weight: 57.2 kg  Vital Signs: Temp: 97.9 F (36.6 C) (12/20 0335) Temp Source: Oral (12/20 0335) BP: 114/54 (12/20 0335) Pulse Rate: 67 (12/20 0335)  Labs: Recent Labs    12/19/17 2033 12/20/17 0334  HGB 11.3* 11.0*  HCT 34.6* 33.4*  PLT 219 226  HEPARINUNFRC  --  0.38  CREATININE  --  1.49*    Estimated Creatinine Clearance: 22.9 mL/min (A) (by C-G formula based on SCr of 1.49 mg/dL (H)).   Medical History: Past Medical History:  Diagnosis Date  . Anemia   . Arthritis   . Carotid artery occlusion    Right side  . Chronic kidney disease   . Depression   . Diabetes mellitus without complication ([place])   . Diverticulitis   . GERD (gastroesophageal reflux disease)   . HTN (hypertension)   . Hyperlipidemia   . Stroke ([place]) Sept-Oct 2015  . Urine incontinence     Medications:  Scheduled:  . amLODipine  5 mg Oral Daily  . aspirin EC  81 mg Oral Daily  . atorvastatin  80 mg Oral q1800  . calcitRIOL  0.25 mcg Oral Daily  . doxazosin  2 mg Oral QHS  . famotidine  20 mg Oral Daily  . fluticasone  2 spray Each Nare Daily  . hydrALAZINE  50 mg Oral TID  . loratadine  10 mg Oral Daily  . losartan  100 mg Oral Daily  . metoprolol  tartrate  12.5 mg Oral BID  . vitamin B-12  1,000 mcg Oral Daily    Assessment: 77 yo female who transferred to [facility] for potential CABG.  S/p cath at [facility] this afternoon.  Pharmacy asked to start IV heparin 8 hrs after sheath out (removed 1115 AM).  No PTA anticoagulation.  Initial heparin level came back therapeutic at 0.38, on 700 units/hr. Hgb 11, plt 226. No s/sx of bleeding. No infusion issues documented.   Goal of Therapy:  Heparin level 0.3-0.7 units/ml Monitor platelets by anticoagulation protocol: Yes   Plan:  Continue heparin infusion at 700 units/hr Check heparin level in 8 hrs Daily heparin level and CBC. F/u plans for CABG.  Antonietta Jewel, PharmD, [place] Clinical Pharmacist  Pager: [phone] Phone: [phone]  12/20/2017 7:18 AM   ADDENDUM Heparin level came back therapeutic at 0.42, on 700 units/hr. CBC stable. No infusion issues. Continue at same rate and monitor with morning labs.   Antonietta Jewel, PharmD, [place] Clinical Pharmacist

## 2017-12-20 NOTE — Consult Note (Addendum)
Candace Monroe       [place] [address]             [id number]        Candace Monroe [place] Medical Record #[id number] Date of Birth: 11-30-1940  Referring: No ref. provider found Primary Care: Crecencio Mc, MD Primary Cardiologist:No primary care provider on file.  Chief Complaint:   We are asked to evaluate Candace Monroe for management of severe multi-vessel coronary artery disease.  History of Present Illness: Candace Monroe is a 77 year old female with a history of dyslipidemia, type 2 diabetes, hypertension, chronic renal insufficiency, and cerebrovascular disease.  She status post right-sided cerebrovascular accident in 2015.  She was recently underwent duplex carotid artery ultrasound for follow-up of her carotid artery stenosis.  She was found to have advancement of the right carotid lesion to greater than 75%.  Subsequent CT angiography confirmed this lesion.  In preparation for possible right carotid endarterectomy, she was sent for cardiac evaluation.  She was unable to do an exercise stress test due to her dependence on a walker so she was scheduled for Lexiscan.  This study demonstrated significant anterior wall ischemia so she went on to have left heart catheterization on 12/19/2017.  This study confirmed severe multivessel coronary artery disease with a 75% left main coronary artery stenosis, 99% ostial LAD stenosis and a 95% proximal circumflex stenosis.  Left ventricular ejection fraction was estimated at 55 to 65%.   Candace Monroe denies having any chest pain, shortness of breath, lower extremity swelling, or palpitations.  She does ambulate with a walker but has done so since her stroke 4 years ago.  She and her husband live independently and care for themselves.   We have been asked to evaluate in consideration of coronary bypass grafting.  The vascular team will evaluate independently to determine the appropriate management for her asymptomatic right carotid  artery stenosis.   Current Activity/ Functional Status:  Zubrod Score: At the time of surgery this patient's most appropriate activity status/level should be described as: []     0    Normal activity, no symptoms [x]     1    Restricted in physical strenuous activity but ambulatory, able to do out light work []     2    Ambulatory and capable of self care, unable to do work activities, up and about more than 50%  Of the time                            []     3    Only limited self care, in bed greater than 50% of waking hours []     4    Completely disabled, no self care, confined to bed or chair []     5    Moribund  Past Medical History:  Diagnosis Date  . Anemia   . Arthritis   . Carotid artery occlusion    Right side  . Chronic kidney disease   . Depression   . Diabetes mellitus without complication ([place])   . Diverticulitis   . GERD (gastroesophageal reflux disease)   . HTN (hypertension)   . Hyperlipidemia   . Stroke ([place]) Sept-Oct 2015  . Urine incontinence     Past Surgical History:  Procedure Laterality Date  . ABDOMINAL HYSTERECTOMY  1973  . CHOLECYSTECTOMY  1990  . COLONOSCOPY WITH PROPOFOL N/A 07/16/2017   Procedure:  COLONOSCOPY WITH PROPOFOL;  Surgeon: Lucilla Lame, MD;  [facility] ENDOSCOPY;  Service: Endoscopy;  Laterality: N/A;  . LEFT HEART CATH AND CORONARY ANGIOGRAPHY Left 12/19/2017   Procedure: LEFT HEART CATH AND CORONARY ANGIOGRAPHY;  Surgeon: Minna Merritts, MD;  Location: [facility];  Service: Cardiovascular;  Laterality: Left;  . TONSILLECTOMY      Social History   Tobacco Use  Smoking Status Former Smoker  . Years: 50.00  . Last attempt to quit: 09/01/2013  . Years since quitting: 4.3  Smokeless Tobacco Never Used    Social History   Substance and Sexual Activity  Alcohol Use No  . Alcohol/week: 0.0 standard drinks     Allergies  Allergen Reactions  . Ace Inhibitors     Renal insufficiency  . Augmentin [Amoxicillin-Pot  Clavulanate] Itching    Has patient had a PCN reaction causing immediate rash, facial/tongue/throat swelling, SOB or lightheadedness with hypotension: No Has patient had a PCN reaction causing severe rash involving mucus membranes or skin necrosis: No Has patient had a PCN reaction that required hospitalization: No Has patient had a PCN reaction occurring within the last 10 years: No If all of the above answers are "NO", then may proceed with Cephalosporin use.   . Sulfa Antibiotics Itching    Current Facility-Administered Medications  Medication Dose Route Frequency Provider Last Rate Last Dose  . acetaminophen (TYLENOL) tablet 650 mg  650 mg Oral Q4H PRN Sande Rives E, PA-C      . amitriptyline (ELAVIL) tablet 10 mg  10 mg Oral QHS PRN Sande Rives E, PA-C   10 mg at 12/19/17 2148  . amLODipine (NORVASC) tablet 5 mg  5 mg Oral Daily Sande Rives E, PA-C   5 mg at 12/20/17 7893  . aspirin EC tablet 81 mg  81 mg Oral Daily Darreld Mclean, PA-C   81 mg at 12/20/17 8101  . atorvastatin (LIPITOR) tablet 80 mg  80 mg Oral q1800 Darreld Mclean, PA-C   80 mg at 12/19/17 2015  . calcitRIOL (ROCALTROL) capsule 0.25 mcg  0.25 mcg Oral Daily Sande Rives E, PA-C   0.25 mcg at 12/20/17 7510  . doxazosin (CARDURA) tablet 2 mg  2 mg Oral QHS Sande Rives E, PA-C   2 mg at 12/19/17 2147  . famotidine (PEPCID) tablet 20 mg  20 mg Oral Daily Sande Rives E, PA-C   20 mg at 12/20/17 0913  . fluticasone (FLONASE) 50 MCG/ACT nasal spray 2 spray  2 spray Each Nare Daily Sande Rives E, PA-C   2 spray at 12/20/17 0913  . heparin ADULT infusion 100 units/mL (25000 units/275m sodium chloride 0.45%)  700 Units/hr Intravenous Continuous Carney, JGay Filler RPH 7 mL/hr at 12/19/17 2012 700 Units/hr at 12/19/17 2012  . hydrALAZINE (APRESOLINE) tablet 50 mg  50 mg Oral TID GSande RivesE, PA-C   50 mg at 12/20/17 0913  . loratadine (CLARITIN) tablet 10 mg  10 mg Oral Daily  GSande RivesE, PA-C   10 mg at 12/20/17 0913  . losartan (COZAAR) tablet 100 mg  100 mg Oral Daily GSande RivesE, PA-C   100 mg at 12/20/17 02585 . metoprolol tartrate (LOPRESSOR) tablet 12.5 mg  12.5 mg Oral BID GSande RivesE, PA-C   12.5 mg at 12/20/17 02778 . nitroGLYCERIN (NITROSTAT) SL tablet 0.4 mg  0.4 mg Sublingual Q5 Min x 3 PRN GSande RivesE, PA-C      . ondansetron (  ZOFRAN) injection 4 mg  4 mg Intravenous Q6H PRN Sande Rives E, PA-C      . vitamin B-12 (CYANOCOBALAMIN) tablet 1,000 mcg  1,000 mcg Oral Daily Sande Rives E, PA-C   1,000 mcg at 12/20/17 1962    Medications Prior to Admission  Medication Sig Dispense Refill Last Dose  . amitriptyline (ELAVIL) 10 MG tablet TAKE 1 TABLET BY MOUTH EVERY DAY AT BEDTIME AS NEEDED FOR INSOMNIA (Patient taking differently: Take 10 mg by mouth at bedtime as needed for sleep. ) 90 tablet 1 12/18/2017 at Unknown time  . amLODipine (NORVASC) 10 MG tablet TAKE 1 TABLET BY MOUTH EVERY DAY 90 tablet 1 12/19/2017 at Unknown time  . aspirin 81 MG tablet Take 81 mg by mouth daily.   12/19/2017 at Unknown time  . atorvastatin (LIPITOR) 20 MG tablet TAKE 1 TABLET BY MOUTH EVERY DAY (Patient not taking: Reported on 12/19/2017) 90 tablet 1 Not Taking at Unknown time  . blood glucose meter kit and supplies Please dispense One Touch meter, E11.22 1 each 0 Taking  . calcitRIOL (ROCALTROL) 0.25 MCG capsule Take 0.25 mcg by mouth daily.   12/19/2017 at Unknown time  . [employer] VITAMIN B12 1000 MCG tablet TAKE 1 TABLET BY MOUTH EVERY DAY 90 tablet 2 12/19/2017 at Unknown time  . doxazosin (CARDURA) 2 MG tablet TAKE 1 TABLET BY MOUTH EVERY DAY (Patient taking differently: Take 2 mg by mouth at bedtime. ) 90 tablet 1 12/18/2017 at Unknown time  . famotidine (PEPCID) 20 MG tablet TAKE 1 TABLET BY MOUTH EVERY DAY (Patient taking differently: Take 20 mg by mouth daily as needed for heartburn. ) 90 tablet 1 12/19/2017 at Unknown time  .  fexofenadine (ALLEGRA) 180 MG tablet TAKE 1 TABLET BY MOUTH EVERY DAY 90 tablet 1 12/18/2017 at Unknown time  . fluticasone (FLONASE) 50 MCG/ACT nasal spray Place 2 sprays into both nostrils daily as needed for allergies.    Past Week at Unknown time  . glucose blood test strip Use as instructed, one touch to check blood glucose up to 3 times a day. E11.22 100 each 12 Taking  . hydrALAZINE (APRESOLINE) 50 MG tablet Take 50 mg by mouth 3 (three) times daily.   12/19/2017 at Unknown time  . Lancets (FREESTYLE) lancets USE AS DIRECTED ONCE DAILY 100 each 0 Taking  . losartan (COZAAR) 100 MG tablet Take 1 tablet (100 mg total) by mouth daily. (Patient not taking: Reported on 12/19/2017) 90 tablet 0 Not Taking at Unknown time    Family History  Problem Relation Age of Onset  . Stroke Mother   . Cancer Sister 16       breast  . Diabetes Brother   . Seizures Sister   . Diabetes Son      Review of Systems:   Review of Systems  Constitutional: Negative.  Negative for chills, diaphoresis, fever, malaise/fatigue and weight loss.  Eyes: Negative.   Respiratory: Negative for cough and shortness of breath.   Cardiovascular: Negative for chest pain, palpitations, orthopnea, leg swelling and PND.  Musculoskeletal: Negative for myalgias.  Neurological: Negative for dizziness, sensory change, speech change, focal weakness and headaches.  Psychiatric/Behavioral: Positive for depression.        Physical Exam: BP 133/63 (BP Location: Right Arm)   Pulse 68   Temp 97.7 F (36.5 C) (Oral)   Resp 18   SpO2 96%   Physical Exam   General: Candace Monroe is a pleasant 77 year old female who appears  her stated age.  She is resting comfortably in bed with family members at the bedside.  She is in no acute distress.  Skin: Thinning of the skin is noted throughout with bruising, small hematomas noted at IV  and phlebotomy sites on the right arm  HEENT: Head is normocephalic.  Pupils are equal, slightly  irregular (she is status post bilateral lens implants for Atarax) oropharynx is clear.  Neck is supple and without mass.  There is a significant carotid bruit that is easily auscultated on both sides of the neck.  Chest: Symmetrical expansion.  Breath sounds are clear to auscultation.  Heart: regular rate and rhythm with no murmur auscultated.  Abdomen: Normoactive bowel sounds.  The abdomen is soft and nontender.  Extremities: All are well perfused with palpable distal pulses.  There is brisk capillary refill and toes on both feet.  There is no significant edema.  Neuro: She moves all extremities to command without focal deficit  Diagnostic Studies & Laboratory data:   Cardiac Catheterization Procedure Note  Name: Candace Monroe MRN: [id number] DOB: [birth year]  Procedure: Left Heart Cath, Selective Coronary Angiography, LV angiography  Indication: 77 year old woman with history of COPD, long smoking history, sedentary lifestyle, chronic shortness of breath, chest tightness, severe aortic valve stenosis, stroke with residual right-sided deficits , abnormal EKG concerning for anterior wall MI when seen in the office, stress test for preop evaluation for carotid surgery (CEA) was positive for ischemia, presenting for cardiac catheterization  Procedural details: The right groin was prepped, draped, and anesthetized with 1% lidocaine. Using modified Seldinger technique, a 5 French sheath was introduced into the right femoral artery. Standard Judkins catheters (JL 4, JR 4 and pigtail catheter) were used for coronary angiography and left ventriculography. Catheter exchanges were performed over a guidewire. There were no immediate procedural complications. The patient was transferred to the post catheterization recovery area for further monitoring.  Moderate sedation: I was Face to Face with the patient during this time: (code: [phone])   Procedural Findings:  Coronary angiography:  Coronary  dominance: Right  Left mainstem: Large vessel that bifurcates into the LAD and left circumflex, moderate to severe disease estimated at 75%  Left anterior descending (LAD): Large vessel that extends to the apical region, diagonal branch 2 of moderate size, critical ostial disease 99%, also with 90% mid vessel disease. Diagonal branches are small  Left circumflex (LCx): Large vessel with OM branch 2, moderate ostial disease estimated at 60 to 70%, 95% mid circumflex disease, distal OM severe diffuse disease  Right coronary artery (RCA): Large right dominant vessel with PL and PDA, no significant disease noted  Left ventriculography: Left ventricular systolic function is normal, LVEF is estimated at 55-65%, there is no significant mitral regurgitation , no significant aortic valve stenosis  Final Conclusions:  Moderate to severe left main, critical ostial LAD and mid LAD disease Severe mid circumflex, moderate ostial circumflex  Recommendations:  Case discussed with Dr. Fletcher Anon Would be a very challenging case given left main and ostial LAD stenosis Preference would be consideration of CABG Would also need to consider management/surgery of severe carotid disease Given severity of all of still LAD disease and left main disease recommendation is to transfer patient directly from [facility] to [facility].   Recent Radiology Findings:   No results found.   I have independently reviewed the above radiologic studies and discussed with the patient   Recent Lab Findings: Lab Results  Component Value Date   WBC 6.7  12/20/2017   HGB 11.0 (L) 12/20/2017   HCT 33.4 (L) 12/20/2017   PLT 226 12/20/2017   GLUCOSE 106 (H) 12/20/2017   CHOL 113 12/20/2017   TRIG 80 12/20/2017   HDL 35 (L) 12/20/2017   LDLDIRECT 73.0 02/22/2016   LDLCALC 62 12/20/2017   ALT 12 06/21/2017   AST 12 06/21/2017   NA 143 12/20/2017   K 3.9 12/20/2017   CL 112 (H) 12/20/2017   CREATININE 1.49 (H) 12/20/2017   BUN 20  12/20/2017   CO2 23 12/20/2017   TSH 1.49 12/20/2016   HGBA1C 5.1 12/20/2017      Assessment / Plan:    1: Severe multivessel coronary artery disease with preserved left ventricular function in an asymptomatic 77 year old female.  She has been dependent on a walker since her stroke 4 years ago but otherwise has been living with her husband and caring for herself.    2: Asymptomatic right carotid artery stenosis.  Briefly discussed with the vascular team earlier today and concurrent carotid endarterectomy with the CABG procedure now being considered is not felt to be indicated.  Continue statin and antiplatelet therapy with aspirin.  3: History of hypertension, currently controlled with losartan, metoprolol, and hydralazine.  Would hold losartan 48 hours prior to surgery if we proceed with CABG next week.  4: Type 2 diabetes mellitus currently diet-controlled.  Serum glucose on admission was 106.  Monitor.  5: Dyslipidemia-on statin therapy  6: Chronic renal insufficiency. Baseline Creat 1.5-1.6 on labs over the past 2 months.  Monitor closely following IV contrast.     I  spent 20 minutes counseling the patient face to face.   Antony Odea, PA-C  12/20/2017 11:19 AM  Patient examined, images of coronary angiogram and 2D echocardiogram personally reviewed and counseled with patient and family. Patient was being evaluated for possible carotid surgery and had a stress perfusion scan which was abnormal.  She has no symptoms of angina.  She does have shortness of breath with exertion but has evidence of COPD from a 50-pack-year history of smoking.  Following the abnormal stress test cardiac catheterization was performed by Dr. Rockey Situ.  This demonstrated a 75% left main stenosis with 95% stenosis of the ostial LAD and circumflex.  The right coronary was a large hyperdominant vessel which was normal and fills some distal LAD through collateralization.  Her LVEDP is 19 and her EF is  normal by echocardiogram.  The patient lives with her husband but has a very sedentary lifestyle and uses a rolling walker to ambulate.  The patient had a right body side CVA 4 years ago with minimal residual deficit.  Her carotid artery disease by CTA demonstrates 80% right carotid stenosis 50% left carotid stenosis.  She was evaluated by [facility] for carotid surgery and felt that medical therapy would be the best approach for asymptomatic carotid disease.  The patient is 4 foot 10 and frail.  She has significant difficulty getting up out of bed to ambulate with me in the hallway of the hospital.  After ambulating 50 feet her oxygen saturation dropped from 96% to 88% on room air and she was tachypneic.  I told the patient and her family that I felt that Candace Monroe would be very high risk for CABG due to her comorbid medical problems as listed above[advanced age, frailty, small stature, stage III chronic kidney disease, COPD, previous stroke, bilateral carotid artery disease] She has been evaluated by interventional cardiology and felt not to  be a candidate for PCI.  I would recommend starting Plavix and medical therapy.  The patient's daughter was upset that patient was not recommended for surgery.  I told the patient and her family that I did not feel that  I could get her through surgery with an acceptable outcome and that I felt that she would be in worse shape after surgery than she is now.  The patient and family do understand that she is at high risk for a cardiac event/MI but that in my opinion her risk for surgery would be just as high or higher.

## 2017-12-20 NOTE — Progress Notes (Signed)
  Echocardiogram 2D Echocardiogram has been performed.  Candace Monroe T Candace Monroe 12/20/2017, 11:46 AM

## 2017-12-20 NOTE — Consult Note (Addendum)
[facility] NOTE   MRN : [id number]  Reason for Consult: Carotid stenosis Referring Physician: Dr. Nils Pyle  History of Present Illness: 77 y/o female with history of carotid stenosis followed by Dr. Carroll Kinds at [place].  CT angiogram 10/24/2017 shows80% RICAstenosis consistent with calcified plaque at the origin of the rightinternal carotid artery.  The patient denise history of CVA or TIA.  She denise amaurosis fugax, extremity weakness or aphasia.  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  She ambulates using a walker.    The patient has a history of coronary artery disease, no recent episodes of angina or shortness of breath.  She failed her stress and is scheduled for a cardiac cath on 12/19/2017.  The cath revealed a need for CABG and we have been consulted for the carotis stanosis at the same time.    Past medical history: HTN, CAD, Carotid stenosis R > L, Hyperlipidemia and DM.  She is currently in new on set A fib and is on Heparin. She takes a daily Aspirin.      Current Facility-Administered Medications  Medication Dose Route Frequency Provider Last Rate Last Dose  . acetaminophen (TYLENOL) tablet 650 mg  650 mg Oral Q4H PRN Sande Rives E, PA-C      . amitriptyline (ELAVIL) tablet 10 mg  10 mg Oral QHS PRN Sande Rives E, PA-C   10 mg at 12/19/17 2148  . amLODipine (NORVASC) tablet 5 mg  5 mg Oral Daily Sande Rives E, PA-C   5 mg at 12/20/17 0347  . aspirin EC tablet 81 mg  81 mg Oral Daily Darreld Mclean, PA-C   81 mg at 12/20/17 4259  . atorvastatin (LIPITOR) tablet 80 mg  80 mg Oral q1800 Darreld Mclean, PA-C   80 mg at 12/19/17 2015  . calcitRIOL (ROCALTROL) capsule 0.25 mcg  0.25 mcg Oral Daily Sande Rives E, PA-C   0.25 mcg at 12/20/17 5638  . doxazosin (CARDURA) tablet 2 mg  2 mg Oral QHS Sande Rives E, PA-C   2 mg at 12/19/17 2147  . famotidine (PEPCID) tablet 20 mg  20 mg  Oral Daily Sande Rives E, PA-C   20 mg at 12/20/17 0913  . fluticasone (FLONASE) 50 MCG/ACT nasal spray 2 spray  2 spray Each Nare Daily Sande Rives E, PA-C   2 spray at 12/20/17 0913  . heparin ADULT infusion 100 units/mL (25000 units/231mL sodium chloride 0.45%)  700 Units/hr Intravenous Continuous Carney, Gay Filler, RPH 7 mL/hr at 12/19/17 2012 700 Units/hr at 12/19/17 2012  . hydrALAZINE (APRESOLINE) tablet 50 mg  50 mg Oral TID Sande Rives E, PA-C   50 mg at 12/20/17 0913  . loratadine (CLARITIN) tablet 10 mg  10 mg Oral Daily Sande Rives E, PA-C   10 mg at 12/20/17 0913  . losartan (COZAAR) tablet 100 mg  100 mg Oral Daily Sande Rives E, PA-C   100 mg at 12/20/17 7564  . metoprolol tartrate (LOPRESSOR) tablet 12.5 mg  12.5 mg Oral BID Sande Rives E, PA-C   12.5 mg at 12/20/17 3329  . nitroGLYCERIN (NITROSTAT) SL tablet 0.4 mg  0.4 mg Sublingual Q5 Min x 3 PRN Sande Rives E, PA-C      . ondansetron (ZOFRAN) injection 4 mg  4 mg Intravenous Q6H PRN Sande Rives E, PA-C      . vitamin B-12 (CYANOCOBALAMIN) tablet 1,000 mcg  1,000 mcg  Oral Daily Darreld Mclean, PA-C   1,000 mcg at 12/20/17 8756    Pt meds include: Statin :Yes Betablocker: No ASA: Yes Other anticoagulants/antiplatelets: Heparin  Past Medical History:  Diagnosis Date  . Anemia   . Arthritis   . Carotid artery occlusion    Right side  . Chronic kidney disease   . Depression   . Diabetes mellitus without complication ([place])   . Diverticulitis   . GERD (gastroesophageal reflux disease)   . HTN (hypertension)   . Hyperlipidemia   . Stroke ([place]) Sept-Oct 2015  . Urine incontinence     Past Surgical History:  Procedure Laterality Date  . ABDOMINAL HYSTERECTOMY  1973  . CHOLECYSTECTOMY  1990  . COLONOSCOPY WITH PROPOFOL N/A 07/16/2017   Procedure: COLONOSCOPY WITH PROPOFOL;  Surgeon: Lucilla Lame, MD;  Location: [facility] ENDOSCOPY;  Service: Endoscopy;  Laterality: N/A;  . LEFT  HEART CATH AND CORONARY ANGIOGRAPHY Left 12/19/2017   Procedure: LEFT HEART CATH AND CORONARY ANGIOGRAPHY;  Surgeon: Minna Merritts, MD;  Location: [facility];  Service: Cardiovascular;  Laterality: Left;  . TONSILLECTOMY      Social History Social History   Tobacco Use  . Smoking status: Former Smoker    Years: 50.00    Last attempt to quit: 09/01/2013    Years since quitting: 4.3  . Smokeless tobacco: Never Used  Substance Use Topics  . Alcohol use: No    Alcohol/week: 0.0 standard drinks  . Drug use: No    Family History Family History  Problem Relation Age of Onset  . Stroke Mother   . Cancer Sister 23       breast  . Diabetes Brother   . Seizures Sister   . Diabetes Son     Allergies  Allergen Reactions  . Ace Inhibitors     Renal insufficiency  . Augmentin [Amoxicillin-Pot Clavulanate] Itching    Has patient had a PCN reaction causing immediate rash, facial/tongue/throat swelling, SOB or lightheadedness with hypotension: No Has patient had a PCN reaction causing severe rash involving mucus membranes or skin necrosis: No Has patient had a PCN reaction that required hospitalization: No Has patient had a PCN reaction occurring within the last 10 years: No If all of the above answers are "NO", then may proceed with Cephalosporin use.   . Sulfa Antibiotics Itching     REVIEW OF SYSTEMS  General: [ ]  Weight loss, [ ]  Fever, [ ]  chills Neurologic: [ ]  Dizziness, [ ]  Blackouts, [ ]  Seizure [ x] history of Stroke, [ ]  "Mini stroke", [ ]  Slurred speech, [ ]  Temporary blindness; [ ]  weakness in arms or legs, [ ]  Hoarseness [ ]  Dysphagia Cardiac: [ ]  Chest pain/pressure, [ ]  Shortness of breath at rest [ ]  Shortness of breath with exertion, [x ] Atrial fibrillation or irregular heartbeat  Vascular: [ ]  Pain in legs with walking, [ ]  Pain in legs at rest, [ ]  Pain in legs at night,  [ ]  Non-healing ulcer, [ ]  Blood clot in vein/DVT,   Pulmonary: [ ]  Home  oxygen, [ ]  Productive cough, [ ]  Coughing up blood, [ ]  Asthma,  [ ]  Wheezing [ ]  COPD Musculoskeletal:  [ ]  Arthritis, [ ]  Low back pain, [ ]  Joint pain Hematologic: [ ]  Easy Bruising, [x ] Anemia; [ ]  Hepatitis Gastrointestinal: [ ]  Blood in stool, [ ]  Gastroesophageal Reflux/heartburn, Urinary: [ ]  chronic Kidney disease, [ ]  on HD - [ ]  MWF  or [ ]  TTHS, [ ]  Burning with urination, [ ]  Difficulty urinating Skin: [ ]  Rashes, [ ]  Wounds Psychological: [ ]  Anxiety, [ ]  Depression  Physical Examination Vitals:   12/19/17 2026 12/20/17 0018 12/20/17 0335 12/20/17 0730  BP: (!) 142/58 130/61 (!) 114/54 133/63  Pulse: 83 74 67 68  Resp: (!) 22 20 20 18   Temp: 98.3 F (36.8 C) 98.3 F (36.8 C) 97.9 F (36.6 C) 97.7 F (36.5 C)  TempSrc: Oral Oral Oral Oral  SpO2: 94% 97% 95% 96%   There is no height or weight on file to calculate BMI.  General:  WDWN in NAD HENT: WNL, normocephalic Eyes: Pupils equal Pulmonary: normal non-labored breathing , without Rales, rhonchi,  wheezing Cardiac: RRR, with  Murmurs, rubs or gallops; B carotid bruits Abdomen: soft, NT, no masses Skin: no rashes, ulcers noted;  no Gangrene , no cellulitis; no open wounds;   Vascular Exam/Pulses:Palpable radial, femoral, DP pulses   Musculoskeletal: no muscle wasting or atrophy; no edema  Neurologic: A&O X 3; Appropriate Affect ;  SENSATION: normal; MOTOR FUNCTION: 5/5 Symmetric no weakness on MMT Speech is fluent/normal   Significant Diagnostic Studies: CBC Lab Results  Component Value Date   WBC 6.7 12/20/2017   HGB 11.0 (L) 12/20/2017   HCT 33.4 (L) 12/20/2017   MCV 92.0 12/20/2017   PLT 226 12/20/2017    BMET    Component Value Date/Time   NA 143 12/20/2017 0334   NA 142 11/27/2017 0925   NA 142 09/30/2013 0434   K 3.9 12/20/2017 0334   K 3.8 09/30/2013 0434   CL 112 (H) 12/20/2017 0334   CL 110 (H) 09/30/2013 0434   CO2 23 12/20/2017 0334   CO2 25 09/30/2013 0434   GLUCOSE 106 (H)  12/20/2017 0334   GLUCOSE 108 (H) 09/30/2013 0434   BUN 20 12/20/2017 0334   BUN 23 11/27/2017 0925   BUN 22 (H) 09/30/2013 0434   CREATININE 1.49 (H) 12/20/2017 0334   CREATININE 1.29 09/30/2013 0434   CALCIUM 8.6 (L) 12/20/2017 0334   CALCIUM 8.7 09/30/2013 0434   GFRNONAA 33 (L) 12/20/2017 0334   GFRNONAA 43 (L) 09/30/2013 0434   GFRAA 38 (L) 12/20/2017 0334   GFRAA 52 (L) 09/30/2013 0434   Estimated Creatinine Clearance: 22.9 mL/min (A) (by C-G formula based on SCr of 1.49 mg/dL (H)).  COAG No results found for: INR, PROTIME   Cardiac cath yesterday 12/19/2017  Prox Cx lesion is 95% stenosed.  Mid LAD lesion is 90% stenosed.  Ost LAD lesion is 99% stenosed.  Mid LM to Dist LM lesion is 75% stenosed.  Ost Cx lesion is 60% stenosed.  Mid Cx lesion is 90% stenosed.  The left ventricular ejection fraction is 55-65% by visual estimate.     Non-Invasive Vascular Imaging:  CTA of Neck  IMPRESSION: Advanced atherosclerotic disease throughout the arterial system as discussed above. There is severe stenosis of the distal right common carotid artery with calcified plaque resulting in stenosis of 80%. Soft and calcified plaque at the carotid bifurcation results in severe stenosis of the proximal external carotid artery. Proximal internal carotid artery shows maximal stenosis of 40-50%.  Soft and calcified plaque of the distal left common carotid artery with stenosis of 50%. Atherosclerotic disease at the carotid bifurcation and proximal ICA with proximal ICA stenosis of 50%.  ASSESSMENT/PLAN:  CAD in need of CABG with right carotid stenosis  distal right common carotid artery with calcified plaque  And left ICA stenosis of 50%.  Dr. Carlis Abbott will review the CCTA and exam the patient to determine if she is a candidate for carotid surgery.  She is pending an ECHO today.   Roxy Horseman 12/20/2017 9:54 AM   I have seen and evaluated the patient. I agree with  the PA note as documented above. 80% asymptomatic R ICA stenosis, 50% left ICA stenosis.  If deemed surgical candidate by CT surgery would not favor combined carotid/CABG given higher morbidity and mortality.  Indication for combined intervention would be for symptomatic disease or bilateral high grade disease.  Will continue to follow.  Marty Heck, MD [facility] [place] Office: [phone] Pager: [phone]

## 2017-12-21 LAB — CBC
HCT: 32.5 % — ABNORMAL LOW (ref 36.0–46.0)
Hemoglobin: 10.4 g/dL — ABNORMAL LOW (ref 12.0–15.0)
MCH: 29.5 pg (ref 26.0–34.0)
MCHC: 32 g/dL (ref 30.0–36.0)
MCV: 92.3 fL (ref 80.0–100.0)
Platelets: 205 10*3/uL (ref 150–400)
RBC: 3.52 MIL/uL — ABNORMAL LOW (ref 3.87–5.11)
RDW: 12.1 % (ref 11.5–15.5)
WBC: 6.5 10*3/uL (ref 4.0–10.5)
nRBC: 0 % (ref 0.0–0.2)

## 2017-12-21 LAB — GLUCOSE, CAPILLARY
Glucose-Capillary: 180 mg/dL — ABNORMAL HIGH (ref 70–99)
Glucose-Capillary: 79 mg/dL (ref 70–99)

## 2017-12-21 LAB — BASIC METABOLIC PANEL
Anion gap: 8 (ref 5–15)
BUN: 20 mg/dL (ref 8–23)
CO2: 23 mmol/L (ref 22–32)
Calcium: 8.7 mg/dL — ABNORMAL LOW (ref 8.9–10.3)
Chloride: 109 mmol/L (ref 98–111)
Creatinine, Ser: 1.54 mg/dL — ABNORMAL HIGH (ref 0.44–1.00)
GFR calc Af Amer: 37 mL/min — ABNORMAL LOW (ref >60)
GFR calc non Af Amer: 32 mL/min — ABNORMAL LOW (ref >60)
Glucose, Bld: 110 mg/dL — ABNORMAL HIGH (ref 70–99)
Potassium: 3.9 mmol/L (ref 3.5–5.1)
Sodium: 140 mmol/L (ref 135–145)

## 2017-12-21 LAB — HEPARIN LEVEL (UNFRACTIONATED): Heparin Unfractionated: 0.36 IU/mL (ref 0.30–0.70)

## 2017-12-21 MED ORDER — NITROGLYCERIN 0.4 MG SL SUBL: 0.4000 mg | SUBLINGUAL_TABLET | SUBLINGUAL | 2 refills | Status: DC | PRN

## 2017-12-21 MED ORDER — ATORVASTATIN CALCIUM 80 MG PO TABS: 80.0000 mg | ORAL_TABLET | Freq: Every day | ORAL | 5 refills | Status: DC

## 2017-12-21 MED ORDER — METOPROLOL TARTRATE 25 MG PO TABS: 12.5000 mg | ORAL_TABLET | Freq: Two times a day (BID) | ORAL | 5 refills | Status: DC

## 2017-12-21 MED ORDER — CLOPIDOGREL BISULFATE 75 MG PO TABS: 75.0000 mg | ORAL_TABLET | Freq: Every day | ORAL | 11 refills | Status: DC

## 2017-12-21 MED ORDER — AMLODIPINE BESYLATE 5 MG PO TABS: 5.0000 mg | ORAL_TABLET | Freq: Every day | ORAL | 5 refills | Status: DC

## 2017-12-21 MED ORDER — CLOPIDOGREL BISULFATE 75 MG PO TABS
75.0000 mg | ORAL_TABLET | Freq: Every day | ORAL | Status: DC
  Administered 2017-12-21: 75 mg via ORAL
  Filled 2017-12-21: qty 1

## 2017-12-21 NOTE — Discharge Summary (Signed)
Discharge Summary    Patient ID: Candace Monroe MRN: [id number]; DOB: 10-Oct-1940  Admit date: 12/19/2017 Discharge date: 12/21/2017  Primary Care Provider: Crecencio Mc, MD  Primary Cardiologist: Ida Rogue, MD  Primary Electrophysiologist:  None  Vascular: Dr. Hortencia Pilar  Discharge Diagnoses    Principal Problem:   Coronary atherosclerosis of native coronary artery Active Problems:   Spastic hemiparesis affecting dominant side ([place])   HTN (hypertension)   Diabetes mellitus with chronic kidney disease ([place])   Carotid artery stenosis with cerebral infarction ([place])   Diabetic nephropathy ([place])   Hyperlipidemia LDL goal <100   Iron deficiency anemia   PAD (peripheral artery disease) (HCC)   History of smoking   CKD (chronic kidney disease) stage 3, GFR 30-59 ml/min (HCC)   Unstable angina (HCC)   3-vessel coronary artery disease   Allergies Allergies  Allergen Reactions  . Ace Inhibitors     Renal insufficiency  . Augmentin [Amoxicillin-Pot Clavulanate] Itching    Has patient had a PCN reaction causing immediate rash, facial/tongue/throat swelling, SOB or lightheadedness with hypotension: No Has patient had a PCN reaction causing severe rash involving mucus membranes or skin necrosis: No Has patient had a PCN reaction that required hospitalization: No Has patient had a PCN reaction occurring within the last 10 years: No If all of the above answers are "NO", then may proceed with Cephalosporin use.   . Sulfa Antibiotics Itching    Diagnostic Studies/Procedures    Procedures   LEFT HEART CATH AND CORONARY ANGIOGRAPHY 12/19/17  Conclusion    Prox Cx lesion is 95% stenosed.  Mid LAD lesion is 90% stenosed.  Ost LAD lesion is 99% stenosed.  Mid LM to Dist LM lesion is 75% stenosed.  Ost Cx lesion is 60% stenosed.  Mid Cx lesion is 90% stenosed.  The left ventricular ejection fraction is 55-65% by visual estimate.     2D Echo 12/20/17 LV EF:  55% -   60%  ------------------------------------------------------------------- Indications:      Chest pain 786.51.  ------------------------------------------------------------------- History:   PMH:   Stroke.  Risk factors:  Hypertension. Diabetes mellitus. Dyslipidemia.  ------------------------------------------------------------------- Study Conclusions  - Left ventricle: The cavity size was normal. Wall thickness was   increased in a pattern of mild LVH. Systolic function was normal.   The estimated ejection fraction was in the range of 55% to 60%.   Wall motion was normal; there were no regional wall motion   abnormalities. Doppler parameters are consistent with abnormal   left ventricular relaxation (grade 1 diastolic dysfunction).   Doppler parameters are consistent with high ventricular filling   pressure. - Mitral valve: Calcified annulus. - Pericardium, extracardiac: A trivial pericardial effusion was   identified.  Impressions:  - Normal LV systolic function; mild diastolic dysfunction; mild   LVH.     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    Hospital Course     Pt had LHC at [facility] on 12/19/17 which showed moderate to severe left main, critical ostial LAD, and mid LAD disease as well as severe mid circumflex and moderate ostial circumflex disease.  [place] PCI/ intervention was felt to be very challenging given her left main and ostial LAD stenosis and preference was therefore for consideration of CABG at [facility].  Also needing considered was her management/surgery for severe carotid artery disease.  Given the severity of the ostial LAD disease and left main disease, recommendation was therefore made for transfer of patient directly from [facility] to [facility].  CT surgery was consulted. She is felt to be too high risk for surgical intervention.  Anatomy not suitable for PCI (discussed with Dr Burt Knack). In addition, given the severity of her coronary disease, she would likely not be a good candidate for vascular surgery. Medical therapy is recommended for her CAD and PVD. She is being treated w/ ASA, Plavix, statin, beta-blocker and calcium blocker.  On 12/21, she was seen by Dr. Stanford Breed and doing well w/o CP or dyspnea. BMP shows slight bump in SCr from 1.4>>1.57 post cath but baseline SCr over the last 2 yrs has ranged from 1.3-1.9. She is on losartan for BP and will need a repeat BMP at hospital f/u next week. Dr. Stanford Breed felt that she was stable for d/c home. She has f/u arranged in [place] w/ APP on 01/02/18 at 9:30 am.    Consultants: CT surgery, Vascular Surgery   Discharge Vitals Blood pressure (!) 145/51, pulse 73, temperature 98.3 F (36.8 C), temperature source Oral, resp. rate (!) 21, SpO2 96 %.  There were no vitals filed for this visit.  Labs & Radiologic Studies    CBC Recent Labs    12/20/17 0334 12/21/17 0137  WBC 6.7 6.5  HGB 11.0* 10.4*  HCT 33.4* 32.5*  MCV 92.0 92.3  PLT 226 578   Basic Metabolic Panel Recent Labs    12/20/17 0334 12/21/17 1059  NA 143 140  K 3.9 3.9  CL 112* 109  CO2 23 23  GLUCOSE 106* 110*  BUN 20 20  CREATININE 1.49* 1.54*  CALCIUM 8.6* 8.7*   Liver Function Tests No results for input(s): AST, ALT, ALKPHOS, BILITOT, PROT, ALBUMIN in the last 72 hours. No results for input(s): LIPASE, AMYLASE in the last 72 hours. Cardiac Enzymes No results for input(s): CKTOTAL, CKMB, CKMBINDEX, TROPONINI in the last 72 hours. BNP Invalid  input(s): POCBNP D-Dimer No results for input(s): DDIMER in the last 72 hours. Hemoglobin A1C Recent Labs    12/20/17 0334  HGBA1C 5.1   Fasting Lipid Panel Recent Labs    12/20/17 0334  CHOL 113  HDL 35*  LDLCALC 62  TRIG 80  CHOLHDL 3.2   Thyroid Function Tests No results for input(s): TSH, T4TOTAL, T3FREE, THYROIDAB in the last 72 hours.  Invalid input(s): FREET3 _____________  [place] US Doppler Pre Cabg  Result Date: 12/21/2017 PREOPERATIVE VASCULAR EVALUATION  Indications: Pre-CABG. Performing Technologist: Carlos Levering Rvt  Examination Guidelines: A complete evaluation includes B-mode imaging, spectral Doppler, color Doppler, and power Doppler as needed of all accessible portions of each vessel. Bilateral testing is considered an integral part of a complete examination. Limited examinations for reoccurring indications may be performed as noted.  Right Carotid Findings: +----------+-------+-------+--------+---------------------------------+--------+           PSV    EDV    StenosisDescribe                         Comments           cm/s   cm/s                                                     +----------+-------+-------+--------+---------------------------------+--------+  CCA Prox  90     13             smooth and heterogenous                   +----------+-------+-------+--------+---------------------------------+--------+ CCA Distal111    19             irregular, heterogenous and                                               calcific                                  +----------+-------+-------+--------+---------------------------------+--------+ ICA Prox  340    49     40-59%  irregular, heterogenous and                                               calcific                                  +----------+-------+-------+--------+---------------------------------+--------+ ICA Mid   212    22             irregular and heterogenous                 +----------+-------+-------+--------+---------------------------------+--------+ ICA Distal94     22                                              tortuous +----------+-------+-------+--------+---------------------------------+--------+ ECA       368    41                                                       +----------+-------+-------+--------+---------------------------------+--------+ Portions of this table do not appear on this page. +----------+--------+-------+--------+------------+           PSV cm/sEDV cmsDescribeArm Pressure +----------+--------+-------+--------+------------+ Subclavian128                    147          +----------+--------+-------+--------+------------+ +---------+--------+--+--------+--+---------+ VertebralPSV cm/s76EDV cm/s19Antegrade +---------+--------+--+--------+--+---------+ Left Carotid Findings: +----------+--------+-------+--------+--------------------------------+--------+           PSV cm/sEDV    StenosisDescribe                        Comments                   cm/s                                                    +----------+--------+-------+--------+--------------------------------+--------+ CCA Prox  120     23  smooth and heterogenous                  +----------+--------+-------+--------+--------------------------------+--------+ CCA Mid   355     31             smooth, heterogenous and                                                  calcific                                 +----------+--------+-------+--------+--------------------------------+--------+ CCA Distal267     19             smooth and heterogenous                  +----------+--------+-------+--------+--------------------------------+--------+ ICA Prox  189     51     40-59%  smooth, heterogenous and        tortuous                                  calcific                                  +----------+--------+-------+--------+--------------------------------+--------+ ICA Mid   117     33             smooth and heterogenous         tortuous +----------+--------+-------+--------+--------------------------------+--------+ ICA Distal92      22                                             tortuous +----------+--------+-------+--------+--------------------------------+--------+ ECA       237     17                                                      +----------+--------+-------+--------+--------------------------------+--------+  +----------+--------+--------+--------+------------+ SubclavianPSV cm/sEDV cm/sDescribeArm Pressure +----------+--------+--------+--------+------------+           251                     155          +----------+--------+--------+--------+------------+ +---------+--------+--+--------+--+---------+ VertebralPSV cm/s57EDV cm/s13Antegrade +---------+--------+--+--------+--+---------+  ABI Findings: +--------+------------------+-----+---------+--------+ Right   Rt Pressure (mmHg)IndexWaveform Comment  +--------+------------------+-----+---------+--------+ [id number]                    triphasic         +--------+------------------+-----+---------+--------+ PTA     132               0.85 triphasic         +--------+------------------+-----+---------+--------+ DP      171               1.10 triphasic         +--------+------------------+-----+---------+--------+ +--------+------------------+-----+---------+-------+ Left    Lt Pressure (mmHg)IndexWaveform Comment +--------+------------------+-----+---------+-------+ [id number]  triphasic        +--------+------------------+-----+---------+-------+ PTA     151               0.97 triphasic        +--------+------------------+-----+---------+-------+ DP      158               1.02 triphasic         +--------+------------------+-----+---------+-------+ +-------+---------------+----------------+ ABI/TBIToday's ABI/TBIPrevious ABI/TBI +-------+---------------+----------------+ Right  1.1                             +-------+---------------+----------------+ Left   1.02                            +-------+---------------+----------------+  Right Doppler Findings: +-----------+--------+-----+---------+-----------------------------------------+ Site       PressureIndexDoppler  Comments                                  +-----------+--------+-----+---------+-----------------------------------------+ Brachial   147          triphasic                                          +-----------+--------+-----+---------+-----------------------------------------+ Radial                  triphasic                                          +-----------+--------+-----+---------+-----------------------------------------+ Ulnar                   triphasic                                          +-----------+--------+-----+---------+-----------------------------------------+ Palmar Arch                      Palmar waveforms are obliterated with                                      radial and ulnar compression.             +-----------+--------+-----+---------+-----------------------------------------+  Left Doppler Findings: +-----------+--------+-----+---------+-----------------------------------------+ Site       PressureIndexDoppler  Comments                                  +-----------+--------+-----+---------+-----------------------------------------+ Brachial   155          triphasic                                          +-----------+--------+-----+---------+-----------------------------------------+ Radial                  triphasic                                          +-----------+--------+-----+---------+-----------------------------------------+  Ulnar                    triphasic                                          +-----------+--------+-----+---------+-----------------------------------------+ Palmar Arch                      Palmar waveforms remain within normal                                      limits with radial compression and are                                     obliterated with ulnar compression.       +-----------+--------+-----+---------+-----------------------------------------+  Summary: Right Carotid: Velocities in the right ICA are consistent with a 40-59%                stenosis. Left Carotid: Velocities in the left ICA are consistent with a 40-59% stenosis. Vertebrals: Bilateral vertebral arteries demonstrate antegrade flow. Right ABI: Resting right ankle-brachial index is within normal range. No evidence of significant right lower extremity arterial disease. Left ABI: Resting left ankle-brachial index is within normal range. No evidence of significant left lower extremity arterial disease.  Electronically signed by Monica Martinez MD on 12/21/2017 at 10:59:28 AM.    Final    Disposition   Pt is being discharged home today in good condition.  Follow-up Plans & Appointments    Follow-up Information    Rise Mu, PA-C Follow up on 01/02/2018.   Specialties:  Physician Assistant, Cardiology, Radiology Why:  9:30 AM Contact information: [place] [address] [phone]          Discharge Instructions    Diet - low sodium heart healthy   Complete by:  As directed    Increase activity slowly   Complete by:  As directed       Discharge Medications   Allergies as of 12/21/2017      Reactions   Ace Inhibitors    Renal insufficiency   Augmentin [amoxicillin-pot Clavulanate] Itching   Has patient had a PCN reaction causing immediate rash, facial/tongue/throat swelling, SOB or lightheadedness with hypotension: No Has patient had a PCN reaction causing  severe rash involving mucus membranes or skin necrosis: No Has patient had a PCN reaction that required hospitalization: No Has patient had a PCN reaction occurring within the last 10 years: No If all of the above answers are "NO", then may proceed with Cephalosporin use.   Sulfa Antibiotics Itching      Medication List    TAKE these medications   amitriptyline 10 MG tablet Commonly known as:  ELAVIL TAKE 1 TABLET BY MOUTH EVERY DAY AT BEDTIME AS NEEDED FOR INSOMNIA What changed:  See the new instructions.   amLODipine 5 MG tablet Commonly known as:  NORVASC Take 1 tablet (5 mg total) by mouth daily. Start taking on:  December 22, 2017 What changed:    medication strength  how much to take   aspirin 81 MG tablet Take 81 mg by mouth daily.   atorvastatin 80 MG tablet Commonly  known as:  LIPITOR Take 1 tablet (80 mg total) by mouth daily at 6 PM. 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   clopidogrel 75 MG tablet Commonly known as:  PLAVIX Take 1 tablet (75 mg total) by mouth daily. Start taking on:  December 22, 2017   [employer] VITAMIN B12 1000 MCG tablet Generic drug:  cyanocobalamin TAKE 1 TABLET BY MOUTH EVERY DAY   doxazosin 2 MG tablet Commonly known as:  CARDURA TAKE 1 TABLET BY MOUTH EVERY DAY What changed:  when to take this   famotidine 20 MG tablet Commonly known as:  PEPCID TAKE 1 TABLET BY MOUTH EVERY DAY What changed:    when to take this  reasons to take this   fexofenadine 180 MG tablet Commonly known as:  ALLEGRA TAKE 1 TABLET BY MOUTH EVERY DAY   fluticasone 50 MCG/ACT nasal spray Commonly known as:  FLONASE Place 2 sprays into both nostrils daily as needed for allergies.   freestyle lancets USE AS DIRECTED ONCE DAILY   glucose  blood test strip Use as instructed, one touch to check blood glucose up to 3 times a day. E11.22   hydrALAZINE 50 MG tablet Commonly known as:  APRESOLINE Take 50 mg by mouth 3 (three) times daily.   losartan 100 MG tablet Commonly known as:  COZAAR Take 1 tablet (100 mg total) by mouth daily.   metoprolol tartrate 25 MG tablet Commonly known as:  LOPRESSOR Take 0.5 tablets (12.5 mg total) by mouth 2 (two) times daily.   nitroGLYCERIN 0.4 MG SL tablet Commonly known as:  NITROSTAT Place 1 tablet (0.4 mg total) under the tongue every 5 (five) minutes x 3 doses as needed for chest pain.        Acute coronary syndrome (MI, NSTEMI, STEMI, etc) this admission?: No.    Outstanding Labs/Studies   BMP in 1 week (will be done at hospital f/u on 1/2).  FLP and HFTs in 6-8 weeks, w/ LDL goal < 70 mg/dL.  Duration of Discharge Encounter   Greater than 30 minutes including physician time.  Signed, Lyda Jester, PA-C 12/21/2017, 1:03 PM

## 2017-12-21 NOTE — Progress Notes (Signed)
[facility]  Subjective  - No acute events overnight.   Objective 135/69 74 98.7 F (37.1 C) (Oral) 15 97%  Intake/Output Summary (Last 24 hours) at 12/21/2017 1146 Last data filed at 12/21/2017 0900 Gross per 24 hour  Intake 627.66 ml  Output 200 ml  Net 427.66 ml    General: NAD No gross neurological deficits  Laboratory Lab Results: Recent Labs    12/20/17 0334 12/21/17 0137  WBC 6.7 6.5  HGB 11.0* 10.4*  HCT 33.4* 32.5*  PLT 226 205   BMET Recent Labs    12/20/17 0334 12/21/17 1059  NA 143 140  K 3.9 3.9  CL 112* 109  CO2 23 23  GLUCOSE 106* 110*  BUN 20 20  CREATININE 1.49* 1.54*  CALCIUM 8.6* 8.7*    COAG No results found for: INR, PROTIME No results found for: PTT  Assessment/Planning:  CT surgery recommended medical management for CAD and too high risk for CABG due to comorbid medical conditions and frailty.  Patient will plan to follow-up with Candace Monroe at [facility] (vascular surgery) to discuss carotid disease given no coronary intervention here.  Patient was initially evaluated by Candace Monroe and sent here for evaluation after abnormal stress perfusion scan.  Vascular surgery will sign off.   Candace Monroe 12/21/2017 11:46 AM --

## 2017-12-21 NOTE — Progress Notes (Signed)
Progress Note  Patient Name: Candace Monroe Date of Encounter: 12/21/2017  Primary Cardiologist: Dr Rockey Situ  Subjective   No CP or dyspnea  Inpatient Medications    Scheduled Meds: . amLODipine  5 mg Oral Daily  . aspirin EC  81 mg Oral Daily  . atorvastatin  80 mg Oral q1800  . calcitRIOL  0.25 mcg Oral Daily  . doxazosin  2 mg Oral QHS  . famotidine  20 mg Oral Daily  . fluticasone  2 spray Each Nare Daily  . hydrALAZINE  50 mg Oral TID  . loratadine  10 mg Oral Daily  . losartan  100 mg Oral Daily  . metoprolol tartrate  12.5 mg Oral BID  . vitamin B-12  1,000 mcg Oral Daily   Continuous Infusions: . heparin 700 Units/hr (12/21/17 0805)   PRN Meds: acetaminophen, amitriptyline, nitroGLYCERIN, ondansetron (ZOFRAN) IV   Vital Signs    Vitals:   12/20/17 2124 12/20/17 2303 12/21/17 0341 12/21/17 0747  BP: (!) 130/56 120/64 (!) 147/76 135/69  Pulse: 85 78 69 74  Resp: (!) 23 (!) 21 16 15   Temp: 98.3 F (36.8 C) 98.5 F (36.9 C) 98.5 F (36.9 C) 98.7 F (37.1 C)  TempSrc: Oral Oral Oral Oral  SpO2: 98% 96% 95% 97%    Intake/Output Summary (Last 24 hours) at 12/21/2017 1030 Last data filed at 12/21/2017 0900 Gross per 24 hour  Intake 634.66 ml  Output 200 ml  Net 434.66 ml   There were no vitals filed for this visit.  Telemetry    Sinus- Personally Reviewed  Physical Exam   GEN: No acute distress.   Neck: No JVD Cardiac: RRR, no murmurs, rubs, or gallops.  Respiratory: Clear to auscultation bilaterally. GI: Soft, nontender, non-distended, right groin with no hematoma or bruit MS: No edema Neuro:  Nonfocal  Psych: Normal affect   Labs    Chemistry Recent Labs  Lab 12/20/17 0334  NA 143  K 3.9  CL 112*  CO2 23  GLUCOSE 106*  BUN 20  CREATININE 1.49*  CALCIUM 8.6*  GFRNONAA 33*  GFRAA 38*  ANIONGAP 8     Hematology Recent Labs  Lab 12/19/17 2033 12/20/17 0334 12/21/17 0137  WBC 8.9 6.7 6.5  RBC 3.80* 3.63* 3.52*  HGB  11.3* 11.0* 10.4*  HCT 34.6* 33.4* 32.5*  MCV 91.1 92.0 92.3  MCH 29.7 30.3 29.5  MCHC 32.7 32.9 32.0  RDW 12.2 12.1 12.1  PLT 219 226 205    Radiology    Vas US Doppler Pre Cabg  Result Date: 12/20/2017 PREOPERATIVE VASCULAR EVALUATION  Indications: Pre-CABG. Performing Technologist: Carlos Levering Rvt  Examination Guidelines: A complete evaluation includes B-mode imaging, spectral Doppler, color Doppler, and power Doppler as needed of all accessible portions of each vessel. Bilateral testing is considered an integral part of a complete examination. Limited examinations for reoccurring indications may be performed as noted.  Right Carotid Findings: +----------+-------+-------+--------+---------------------------------+--------+           PSV    EDV    StenosisDescribe                         Comments           cm/s   cm/s                                                     +----------+-------+-------+--------+---------------------------------+--------+  CCA Prox  90     13             smooth and heterogenous                   +----------+-------+-------+--------+---------------------------------+--------+ CCA Distal111    19             irregular, heterogenous and                                               calcific                                  +----------+-------+-------+--------+---------------------------------+--------+ ICA Prox  340    49     40-59%  irregular, heterogenous and                                               calcific                                  +----------+-------+-------+--------+---------------------------------+--------+ ICA Mid   212    22             irregular and heterogenous                +----------+-------+-------+--------+---------------------------------+--------+ ICA Distal94     22                                              tortuous  +----------+-------+-------+--------+---------------------------------+--------+ ECA       368    41                                                       +----------+-------+-------+--------+---------------------------------+--------+ Portions of this table do not appear on this page. +----------+--------+-------+--------+------------+           PSV cm/sEDV cmsDescribeArm Pressure +----------+--------+-------+--------+------------+ Subclavian128                    147          +----------+--------+-------+--------+------------+ +---------+--------+--+--------+--+---------+ VertebralPSV cm/s76EDV cm/s19Antegrade +---------+--------+--+--------+--+---------+ Left Carotid Findings: +----------+--------+-------+--------+--------------------------------+--------+           PSV cm/sEDV    StenosisDescribe                        Comments                   cm/s                                                    +----------+--------+-------+--------+--------------------------------+--------+ CCA Prox  120     23  smooth and heterogenous                  +----------+--------+-------+--------+--------------------------------+--------+ CCA Mid   355     31             smooth, heterogenous and                                                  calcific                                 +----------+--------+-------+--------+--------------------------------+--------+ CCA Distal267     19             smooth and heterogenous                  +----------+--------+-------+--------+--------------------------------+--------+ ICA Prox  189     51     40-59%  smooth, heterogenous and        tortuous                                  calcific                                 +----------+--------+-------+--------+--------------------------------+--------+ ICA Mid   117     33             smooth and heterogenous         tortuous  +----------+--------+-------+--------+--------------------------------+--------+ ICA Distal92      22                                             tortuous +----------+--------+-------+--------+--------------------------------+--------+ ECA       237     17                                                      +----------+--------+-------+--------+--------------------------------+--------+ +----------+--------+--------+--------+------------+ SubclavianPSV cm/sEDV cm/sDescribeArm Pressure +----------+--------+--------+--------+------------+           251                     155          +----------+--------+--------+--------+------------+ +---------+--------+--+--------+--+---------+ VertebralPSV cm/s57EDV cm/s13Antegrade +---------+--------+--+--------+--+---------+  ABI Findings: +--------+------------------+-----+---------+--------+ Right   Rt Pressure (mmHg)IndexWaveform Comment  +--------+------------------+-----+---------+--------+ [id number]                    triphasic         +--------+------------------+-----+---------+--------+ PTA     132               0.85 triphasic         +--------+------------------+-----+---------+--------+ DP      171               1.10 triphasic         +--------+------------------+-----+---------+--------+ +--------+------------------+-----+---------+-------+ Left    Lt Pressure (mmHg)IndexWaveform Comment +--------+------------------+-----+---------+-------+ [id number]  triphasic        +--------+------------------+-----+---------+-------+ PTA     151               0.97 triphasic        +--------+------------------+-----+---------+-------+ DP      158               1.02 triphasic        +--------+------------------+-----+---------+-------+ +-------+---------------+----------------+ ABI/TBIToday's ABI/TBIPrevious ABI/TBI +-------+---------------+----------------+ Right  1.1                              +-------+---------------+----------------+ Left   1.02                            +-------+---------------+----------------+  Right Doppler Findings: +-----------+--------+-----+---------+-----------------------------------------+ Site       PressureIndexDoppler  Comments                                  +-----------+--------+-----+---------+-----------------------------------------+ Brachial   147          triphasic                                          +-----------+--------+-----+---------+-----------------------------------------+ Radial                  triphasic                                          +-----------+--------+-----+---------+-----------------------------------------+ Ulnar                   triphasic                                          +-----------+--------+-----+---------+-----------------------------------------+ Palmar Arch                      Palmar waveforms are obliterated with                                      radial and ulnar compression.             +-----------+--------+-----+---------+-----------------------------------------+  Left Doppler Findings: +-----------+--------+-----+---------+-----------------------------------------+ Site       PressureIndexDoppler  Comments                                  +-----------+--------+-----+---------+-----------------------------------------+ Brachial   155          triphasic                                          +-----------+--------+-----+---------+-----------------------------------------+ Radial                  triphasic                                          +-----------+--------+-----+---------+-----------------------------------------+  Ulnar                   triphasic                                          +-----------+--------+-----+---------+-----------------------------------------+ Palmar Arch                      Palmar  waveforms remain within normal                                      limits with radial compression and are                                     obliterated with ulnar compression.       +-----------+--------+-----+---------+-----------------------------------------+  Summary: Right Carotid: Velocities in the right ICA are consistent with a 40-59%                stenosis. Left Carotid: Velocities in the left ICA are consistent with a 40-59% stenosis. Vertebrals: Bilateral vertebral arteries demonstrate antegrade flow. Right ABI: Resting right ankle-brachial index is within normal range. No evidence of significant right lower extremity arterial disease. Left ABI: Resting left ankle-brachial index is within normal range. No evidence of significant left lower extremity arterial disease.    Preliminary     Patient Profile     Candace Monroe is a 77 y.o. 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  Assessment & Plan    1 coronary artery disease-patient with severe coronary disease at time of prior catheterization.  She has been seen by cardiothoracic surgery.  She is felt to be too high risk for surgical intervention.  Anatomy not suitable for PCI (discussed with Dr Burt Knack).  Plan medical therapy with aspirin, statin, beta-blocker, calcium blocker.  Add Plavix 75 mg daily.  Discontinue IV heparin.  2 carotid artery disease-given severity of coronary disease she would likely not be a good candidate for vascular surgery.  Continue aspirin and statin.  3 chronic stage III kidney disease-we will recheck renal function prior to discharge today given recent catheterization.  4  hyperlipidemia-continue statin.  5 hypertension-blood pressure is controlled.  Continue present medications at discharge.  Plan discharge today on medical therapy.  Follow-up with Dr. Rockey Situ 1 to 2 weeks following discharge. Greater than 30 minutes PA and physician time. D2  For questions or updates, please contact [place] Please consult [email or web address] for contact info under        Signed, Kirk Ruths, MD  12/21/2017, 10:30 AM

## 2017-12-21 NOTE — Progress Notes (Signed)
[place] for IV heparin Indication: CAD awaiting CABG workup  Allergies  Allergen Reactions  . Ace Inhibitors     Renal insufficiency  . Augmentin [Amoxicillin-Pot Clavulanate] Itching    Has patient had a PCN reaction causing immediate rash, facial/tongue/throat swelling, SOB or lightheadedness with hypotension: No Has patient had a PCN reaction causing severe rash involving mucus membranes or skin necrosis: No Has patient had a PCN reaction that required hospitalization: No Has patient had a PCN reaction occurring within the last 10 years: No If all of the above answers are "NO", then may proceed with Cephalosporin use.   . Sulfa Antibiotics Itching    Patient Measurements: Heparin Dosing Weight: 57.2 kg  Vital Signs: Temp: 98.7 F (37.1 C) (12/21 0747) Temp Source: Oral (12/21 0747) BP: 135/69 (12/21 0747) Pulse Rate: 74 (12/21 0747)  Labs: Recent Labs    12/19/17 2033 12/20/17 0334 12/20/17 1301 12/21/17 0137  HGB 11.3* 11.0*  --  10.4*  HCT 34.6* 33.4*  --  32.5*  PLT 219 226  --  205  HEPARINUNFRC  --  0.38 0.42 0.36  CREATININE  --  1.49*  --   --     Estimated Creatinine Clearance: 22.9 mL/min (A) (by C-G formula based on SCr of 1.49 mg/dL (H)).   Medical History: Past Medical History:  Diagnosis Date  . Anemia   . Arthritis   . Carotid artery occlusion    Right side  . Chronic kidney disease   . Depression   . Diabetes mellitus without complication ([place])   . Diverticulitis   . GERD (gastroesophageal reflux disease)   . HTN (hypertension)   . Hyperlipidemia   . Stroke ([place]) Sept-Oct 2015  . Urine incontinence     Medications:  Scheduled:  . amLODipine  5 mg Oral Daily  . aspirin EC  81 mg Oral Daily  . atorvastatin  80 mg Oral q1800  . calcitRIOL  0.25 mcg Oral Daily  . doxazosin  2 mg Oral QHS  . famotidine  20 mg Oral Daily  . fluticasone  2 spray Each Nare Daily  . hydrALAZINE  50 mg Oral TID  .  loratadine  10 mg Oral Daily  . losartan  100 mg Oral Daily  . metoprolol tartrate  12.5 mg Oral BID  . vitamin B-12  1,000 mcg Oral Daily    Assessment: 77 yo female who transferred to [facility] for potential CABG.  S/p cath at [facility] this afternoon.  Pharmacy asked to start IV heparin 8 hrs after sheath out (removed 1115 AM).  No PTA anticoagulation.  Heparin level therapeutic at 0.3, on 700 units/hr. CBC stable overnight. No s/sx of bleeding. No infusion issues documented.   Does not appear that patient is a candidate for surgery.   Goal of Therapy:  Heparin level 0.3-0.7 units/ml Monitor platelets by anticoagulation protocol: Yes   Plan:  Continue heparin infusion at 700 units/hr Daily heparin level and CBC.  Erin Hearing PharmD., BCPS Clinical Pharmacist 12/21/2017 9:29 AM

## 2017-12-21 NOTE — Progress Notes (Signed)
Discharge instructions gone over with patient and patient's husband.  They were instructed to pick up the prescriptions at [facility] in [place].  They told me it should be [facility] instead.  I called and PA fixed this.  They did not have any other questions.  IV discontinued. Pt ready for discharge.  Tilda Burrow [facility] 12/21/2017 2:53 PM

## 2017-12-24 ENCOUNTER — Other Ambulatory Visit: Payer: Self-pay | Admitting: Internal Medicine

## 2017-12-31 NOTE — Progress Notes (Signed)
Cardiology Office Note Date:  01/02/2018  Patient ID:  Candace, Monroe 11/23/1940, MRN [id number] PCP:  Crecencio Mc, MD  Cardiologist:  Dr. Rockey Situ, MD    Chief Complaint: Follow-up  History of Present Illness: Candace Monroe is a 77 y.o. 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  Patient was seen on 11/14/2017 for preoperative cardiac evaluation prior to carotid artery surgery.  Recent carotid artery ultrasound in 08/2017 showed an estimated stenosis greater than 70% in the right internal carotid artery with 50 to 69% of left internal carotid artery stenosis.  Follow-up CTA of the neck in 10/2017 showed an 80% stenosis of the right internal carotid artery and 50% stenosis in the left internal carotid artery.  In this setting, the patient underwent Lexiscan Myoview in 11/2017 that showed a moderate in size, moderate in severity, partially reversible inferolateral defect consistent with scar and mild peri-infarct ischemia.  EF was 57%.  This was an intermediate risk study.  Given the stress test, the patient was hesitant to proceed with carotid artery surgery without further ischemic evaluation.  She underwent LHC on 12/19/2017 that showed mid to distal left main 75% stenosis, ostial LAD 99% stenosis, mid LAD 90% stenosis, ostial LCx 60% stenosis, proximal LCx 95% stenosis, mid LCx 90% stenosis, EF 55 to 65%.  PCI/intervention was felt to be very challenging giving her left main and ostial LAD disease.  In this setting, the patient was transferred to [facility] for evaluation of possible CABG by CVTS.  Upon arrival at [facility], she underwent echo on 12/20/2017 that showed  an EF of 55 to 60%, mild LVH, no regional wall motion abnormalities, grade 1 diastolic dysfunction, calcified mitral annulus, trivial pericardial effusion.  She was evaluated by CT surgery and felt to be too high risk for surgical intervention.  Her cardiac anatomy was not suitable for PCI.  She was also evaluated by vascular surgery and it was felt, given the severity of her coronary disease, she would not likely be a good candidate for vascular surgery as well.  Medical therapy was recommended for her CAD and PVD.  Discharge labs: Serum creatinine 1.54, potassium 3.9, hemoglobin 10.4  She comes in accompanied by her husband today and is doing well from a cardiac perspective.  She states people at her church have been praying and God has "healed my heart."  She states she never had any chest pain or significant shortness of breath.  She reports God has "taken away my blockages."  She has not had any issues from her cardiac cath site.  She intermittently checks her blood pressure and heart rate at home though cannot recall any readings.  She has not had any falls since she was last seen.  No BRBPR or melena.  She denies any lower extremity swelling, orthopnea, PND, or early satiety.  She states "I have a lot of energy, and you can keep a mountain woman down."  She does not have any issues or concerns at this time.   Past Medical History:  Diagnosis Date  . Anemia   . Arthritis   . CAD (coronary artery disease)    a. LHC 12/19: m-d left main 75%, oLAD 99%, mLAD 90%,  oLCx 60%, pLCx 95%, mLCx 90%, EF 55 to 65%, not a candidate for CABG or PCI, medical management  . Carotid artery occlusion    Right side  . Chronic kidney disease (CKD), stage III (moderate) (HCC)   . Depression   . Diabetes mellitus with complication ([place])   . Diastolic dysfunction    a. TTE 12/19: EF of 55 to 60%, mild LVH, no regional wall motion abnormalities, grade 1 diastolic dysfunction, calcified mitral annulus, trivial  pericardial effusion  . Diverticulitis   . GERD (gastroesophageal reflux disease)   . HTN (hypertension)   . Hyperlipidemia   . Stroke ([place]) Sept-Oct 2015  . Urine incontinence     Past Surgical History:  Procedure Laterality Date  . ABDOMINAL HYSTERECTOMY  1973  . CHOLECYSTECTOMY  1990  . COLONOSCOPY WITH PROPOFOL N/A 07/16/2017   Procedure: COLONOSCOPY WITH PROPOFOL;  Surgeon: Lucilla Lame, MD;  Location: [facility] ENDOSCOPY;  Service: Endoscopy;  Laterality: N/A;  . LEFT HEART CATH AND CORONARY ANGIOGRAPHY Left 12/19/2017   Procedure: LEFT HEART CATH AND CORONARY ANGIOGRAPHY;  Surgeon: Minna Merritts, MD;  Location: [facility];  Service: Cardiovascular;  Laterality: Left;  . TONSILLECTOMY      Current Meds  Medication Sig  . amitriptyline (ELAVIL) 10 MG tablet TAKE 1 TABLET BY MOUTH EVERY DAY AT BEDTIME AS NEEDED FOR INSOMNIA (Patient taking differently: Take 10 mg by mouth at bedtime as needed for sleep. )  . amLODipine (NORVASC) 5 MG tablet Take 1 tablet (5 mg total) by mouth daily.  Marland Kitchen aspirin 81 MG tablet Take 81 mg by mouth daily.  Marland Kitchen atorvastatin (LIPITOR) 80 MG tablet Take 1 tablet (80 mg total) by mouth daily at 6 PM.  . blood glucose meter kit and supplies Please dispense One Touch meter, E11.22  . calcitRIOL (ROCALTROL) 0.25 MCG capsule Take 0.25 mcg by mouth daily.  . clopidogrel (PLAVIX) 75 MG tablet Take 1 tablet (75 mg total) by mouth daily.  . CVS VITAMIN B12 1000 MCG tablet TAKE 1 TABLET BY MOUTH EVERY DAY  . doxazosin (CARDURA) 2 MG tablet TAKE 1 TABLET BY MOUTH EVERY DAY (Patient taking differently: Take 2 mg by mouth at bedtime. )  . famotidine (PEPCID) 20 MG tablet TAKE 1 TABLET BY MOUTH EVERY DAY (Patient taking differently: Take 20 mg by mouth daily as needed for heartburn. )  . fexofenadine (ALLEGRA) 180 MG tablet TAKE 1 TABLET BY MOUTH EVERY DAY  . fluticasone (FLONASE) 50 MCG/ACT nasal spray Place 2 sprays into both nostrils daily as needed for  allergies.   Marland Kitchen glucose blood test strip Use as instructed, one touch to check blood glucose up to 3 times a day. E11.22  . hydrALAZINE (APRESOLINE) 25 MG tablet TAKE 1 TABLET BY MOUTH THREE TIMES A DAY  . Lancets (FREESTYLE) lancets USE AS DIRECTED ONCE DAILY  . losartan (COZAAR) 100 MG tablet Take 1 tablet (100 mg total) by mouth daily.  . metoprolol tartrate (LOPRESSOR) 25 MG tablet Take 0.5 tablets (12.5 mg total) by mouth 2 (two) times daily.  . nitroGLYCERIN (NITROSTAT) 0.4 MG SL tablet Place 1 tablet (0.4 mg total) under the tongue every 5 (five) minutes x 3 doses as needed for chest pain.    Allergies:   Ace inhibitors; Augmentin [amoxicillin-pot clavulanate]; and Sulfa antibiotics   Social History:  The patient  reports that she quit smoking about 4 years ago. She quit after 50.00 years of use. She has never used smokeless tobacco.  She reports that she does not drink alcohol or use drugs.   Family History:  The patient's family history includes Cancer (age of onset: 53) in her sister; Diabetes in her brother and son; Seizures in her sister; Stroke in her mother.  ROS:   Review of Systems  Constitutional: Positive for malaise/fatigue. Negative for chills, diaphoresis, fever and weight loss.  HENT: Negative for congestion.   Eyes: Negative for discharge and redness.  Respiratory: Negative for cough, hemoptysis, sputum production, shortness of breath and wheezing.   Cardiovascular: Negative for chest pain, palpitations, orthopnea, claudication, leg swelling and PND.  Gastrointestinal: Negative for abdominal pain, blood in stool, heartburn, melena, nausea and vomiting.  Genitourinary: Negative for hematuria.  Musculoskeletal: Negative for falls and myalgias.  Skin: Negative for rash.  Neurological: Negative for dizziness, tingling, tremors, sensory change, speech change, focal weakness, loss of consciousness and weakness.  Endo/Heme/Allergies: Does not bruise/bleed easily.    Psychiatric/Behavioral: Negative for substance abuse. The patient is not nervous/anxious.   All other systems reviewed and are negative.    PHYSICAL EXAM:  VS:  BP 118/64 (BP Location: Left Arm, Patient Position: Sitting, Cuff Size: Normal)   Pulse (!) 51   Ht 4' 10"  (1.473 m)   Wt 116 lb (52.6 kg)   BMI 24.24 kg/m  BMI: Body mass index is 24.24 kg/m.  Physical Exam  Constitutional: She is oriented to person, place, and time. She appears well-developed and well-nourished.  HENT:  Head: Normocephalic and atraumatic.  Eyes: Right eye exhibits no discharge. Left eye exhibits no discharge.  Neck: Normal range of motion. No JVD present.  Cardiovascular: Normal rate, regular rhythm, S1 normal, S2 normal and normal heart sounds. Exam reveals no distant heart sounds, no friction rub, no midsystolic click and no opening snap.  No murmur heard. Pulses:      Carotid pulses are on the right side with bruit and on the left side with bruit.      Posterior tibial pulses are 2+ on the right side and 2+ on the left side.  Right femoral cardiac cath site is well-healing without any bleeding, bruising, swelling, erythema, warmth, or tenderness to palpation.  No bruit.  Pulmonary/Chest: Effort normal and breath sounds normal. No respiratory distress. She has no decreased breath sounds. She has no wheezes. She has no rales. She exhibits no tenderness.  Abdominal: Soft. She exhibits no distension. There is no abdominal tenderness.  Musculoskeletal:        General: No edema.  Neurological: She is alert and oriented to person, place, and time.  Skin: Skin is warm and dry. No cyanosis. Nails show no clubbing.  Psychiatric: She has a normal mood and affect. Her speech is normal and behavior is normal. Judgment and thought content normal.     EKG:  Was ordered and interpreted by me today. Shows sinus bradycardia, 51 bpm, nonspecific IVCD, possible prior septal infarct, baseline wandering nonspecific ST-T  changes  Recent Labs: 06/21/2017: ALT 12 12/21/2017: BUN 20; Creatinine, Ser 1.54; Hemoglobin 10.4; Platelets 205; Potassium 3.9; Sodium 140  12/20/2017: Cholesterol 113; HDL 35; LDL Cholesterol 62; Total CHOL/HDL Ratio 3.2; Triglycerides 80; VLDL 16   Estimated Creatinine Clearance: 21.3 mL/min (A) (by C-G formula based on SCr of 1.54 mg/dL (H)).   Wt Readings from Last 3 Encounters:  01/02/18 116 lb (52.6 kg)  12/19/17 126 lb (57.2 kg)  12/12/17 120 lb (54.4 kg)     Other studies reviewed: Additional studies/records reviewed today include: summarized  above  ASSESSMENT AND PLAN:  1. CAD involving the native coronary arteries without angina: She is doing well without any symptoms concerning for angina.  She does have severe multivessel CAD as outlined above that is not amenable to PCI and she is too high risk for bypass surgery.  We will continue to manage her medically with aspirin, Lipitor, Plavix, losartan, and now carvedilol in place of metoprolol given her sinus bradycardia.  Continue aggressive risk factor modification and secondary prevention.  No plans for further ischemic evaluation at this time.  2. Diastolic dysfunction: She does not appear grossly volume overloaded at this time.  She is not on standing diuretic therapy.  Continue to monitor clinically.  3. Carotid artery disease: Has been felt to be too high risk for intervention given underlying CAD that is not amenable to revascularization as above.  Continue to manage medically with aspirin, Plavix, and Lipitor.  I have recommended that she follow-up with her local vascular surgeon to keep them updated.  4. CKD stage III: Check BMP.  5. Sinus bradycardia: Stop metoprolol.  Start carvedilol 3.125 mg twice daily.  6. Hypertension: Blood pressure is well controlled today.  Continue current medications.  7. Hyperlipidemia: Most recent LDL of 62 from 12/2017.  Remains on atorvastatin 80 mg daily.  Disposition: F/u with Dr.  Rockey Situ or an APP in 3 months.  Current medicines are reviewed at length with the patient today.  The patient did not have any concerns regarding medicines.  Signed, Christell Faith, PA-C 01/02/2018 9:21 AM     [address] [place], [place] [id number] [phone]

## 2018-01-02 ENCOUNTER — Encounter: Payer: Self-pay | Admitting: Physician Assistant

## 2018-01-02 ENCOUNTER — Ambulatory Visit: Payer: Medicare HMO | Admitting: Physician Assistant

## 2018-01-02 VITALS — BP 118/64 | HR 51 | Ht <= 58 in | Wt 116.0 lb

## 2018-01-02 DIAGNOSIS — I5189 Other ill-defined heart diseases: Secondary | ICD-10-CM | POA: Diagnosis not present

## 2018-01-02 DIAGNOSIS — R001 Bradycardia, unspecified: Secondary | ICD-10-CM

## 2018-01-02 DIAGNOSIS — E785 Hyperlipidemia, unspecified: Secondary | ICD-10-CM

## 2018-01-02 DIAGNOSIS — I10 Essential (primary) hypertension: Secondary | ICD-10-CM | POA: Diagnosis not present

## 2018-01-02 DIAGNOSIS — I2510 Atherosclerotic heart disease of native coronary artery without angina pectoris: Secondary | ICD-10-CM | POA: Diagnosis not present

## 2018-01-02 DIAGNOSIS — I63239 Cerebral infarction due to unspecified occlusion or stenosis of unspecified carotid arteries: Secondary | ICD-10-CM | POA: Diagnosis not present

## 2018-01-02 DIAGNOSIS — N183 Chronic kidney disease, stage 3 unspecified: Secondary | ICD-10-CM

## 2018-01-02 MED ORDER — CARVEDILOL 3.125 MG PO TABS: 3.1250 mg | ORAL_TABLET | Freq: Two times a day (BID) | ORAL | 6 refills | Status: DC

## 2018-01-02 NOTE — Patient Instructions (Signed)
Medication Instructions:  - Your physician has recommended you make the following change in your medication:  1) STOP metoprolol 2) START coreg (carvediolol) 3.125 mg- take 1 tablet by mouth twice daily  If you need a refill on your cardiac medications before your next appointment, please call your pharmacy.   Lab work: - Your physician recommends that you have lab work today: BMP  If you have labs (blood work) drawn today and your tests are completely normal, you will receive your results only by: Marland Kitchen MyChart Message (if you have MyChart) OR . A paper copy in the mail If you have any lab test that is abnormal or we need to change your treatment, we will call you to review the results.  Testing/Procedures: - none ordered  Follow-Up: At [facility], you and your health needs are our priority.  As part of our continuing mission to provide you with exceptional heart care, we have created designated Provider Care Teams.  These Care Teams include your primary Cardiologist (physician) and Advanced Practice Providers (APPs -  Physician Assistants and Nurse Practitioners) who all work together to provide you with the care you need, when you need it. . in 3 months with Dr. Rockey Situ APP  Any Other Special Instructions Will Be Listed Below (If Applicable). - N/A

## 2018-01-03 ENCOUNTER — Telehealth: Payer: Self-pay

## 2018-01-03 LAB — BASIC METABOLIC PANEL
BUN / CREAT RATIO: 19 (ref 12–28)
BUN: 25 mg/dL (ref 8–27)
CO2: 19 mmol/L — ABNORMAL LOW (ref 20–29)
Calcium: 9.1 mg/dL (ref 8.7–10.3)
Chloride: 111 mmol/L — ABNORMAL HIGH (ref 96–106)
Creatinine, Ser: 1.33 mg/dL — ABNORMAL HIGH (ref 0.57–1.00)
GFR calc Af Amer: 44 mL/min/{1.73_m2} — ABNORMAL LOW (ref >59)
GFR calc non Af Amer: 38 mL/min/{1.73_m2} — ABNORMAL LOW (ref >59)
GLUCOSE: 93 mg/dL (ref 65–99)
Potassium: 4.6 mmol/L (ref 3.5–5.2)
Sodium: 145 mmol/L — ABNORMAL HIGH (ref 134–144)

## 2018-01-03 NOTE — Telephone Encounter (Signed)
-----   Message from Rise Mu, PA-C sent at 01/03/2018  7:12 AM EST ----- Labs showed a continued improvement in her renal function and potassium at goal.

## 2018-01-03 NOTE — Telephone Encounter (Signed)
Call to patient regarding lab results from yesterday. Spoke with husband, okay per DPR.  All questions answered, Advised pt to call for any further questions or concerns

## 2018-01-08 DIAGNOSIS — R69 Illness, unspecified: Secondary | ICD-10-CM | POA: Diagnosis not present

## 2018-01-26 ENCOUNTER — Other Ambulatory Visit: Payer: Self-pay | Admitting: Internal Medicine

## 2018-01-28 ENCOUNTER — Telehealth (INDEPENDENT_AMBULATORY_CARE_PROVIDER_SITE_OTHER): Payer: Self-pay | Admitting: Vascular Surgery

## 2018-01-28 DIAGNOSIS — E113312 Type 2 diabetes mellitus with moderate nonproliferative diabetic retinopathy with macular edema, left eye: Secondary | ICD-10-CM | POA: Diagnosis not present

## 2018-01-28 DIAGNOSIS — H35423 Microcystoid degeneration of retina, bilateral: Secondary | ICD-10-CM | POA: Diagnosis not present

## 2018-01-28 DIAGNOSIS — H43823 Vitreomacular adhesion, bilateral: Secondary | ICD-10-CM | POA: Diagnosis not present

## 2018-01-28 DIAGNOSIS — E113291 Type 2 diabetes mellitus with mild nonproliferative diabetic retinopathy without macular edema, right eye: Secondary | ICD-10-CM | POA: Diagnosis not present

## 2018-02-03 DIAGNOSIS — R69 Illness, unspecified: Secondary | ICD-10-CM | POA: Diagnosis not present

## 2018-02-10 ENCOUNTER — Inpatient Hospital Stay: Payer: Medicare HMO | Attending: Hematology and Oncology

## 2018-02-10 DIAGNOSIS — N184 Chronic kidney disease, stage 4 (severe): Secondary | ICD-10-CM | POA: Insufficient documentation

## 2018-02-10 DIAGNOSIS — Z79899 Other long term (current) drug therapy: Secondary | ICD-10-CM | POA: Insufficient documentation

## 2018-02-10 DIAGNOSIS — D631 Anemia in chronic kidney disease: Secondary | ICD-10-CM | POA: Insufficient documentation

## 2018-02-10 DIAGNOSIS — D508 Other iron deficiency anemias: Secondary | ICD-10-CM

## 2018-02-10 LAB — CBC
HEMATOCRIT: 35.6 % — AB (ref 36.0–46.0)
Hemoglobin: 11.8 g/dL — ABNORMAL LOW (ref 12.0–15.0)
MCH: 30.6 pg (ref 26.0–34.0)
MCHC: 33.1 g/dL (ref 30.0–36.0)
MCV: 92.5 fL (ref 80.0–100.0)
Platelets: 214 10*3/uL (ref 150–400)
RBC: 3.85 MIL/uL — ABNORMAL LOW (ref 3.87–5.11)
RDW: 13.4 % (ref 11.5–15.5)
WBC: 4.8 10*3/uL (ref 4.0–10.5)
nRBC: 0 % (ref 0.0–0.2)

## 2018-02-10 LAB — FERRITIN: Ferritin: 36 ng/mL (ref 11–307)

## 2018-02-10 LAB — IRON AND TIBC
Iron: 53 ug/dL (ref 28–170)
SATURATION RATIOS: 19 % (ref 10.4–31.8)
TIBC: 282 ug/dL (ref 250–450)
UIBC: 229 ug/dL

## 2018-02-27 ENCOUNTER — Other Ambulatory Visit: Payer: Self-pay | Admitting: Internal Medicine

## 2018-03-03 DIAGNOSIS — R69 Illness, unspecified: Secondary | ICD-10-CM | POA: Diagnosis not present

## 2018-03-06 DIAGNOSIS — N2581 Secondary hyperparathyroidism of renal origin: Secondary | ICD-10-CM | POA: Diagnosis not present

## 2018-03-06 DIAGNOSIS — N183 Chronic kidney disease, stage 3 (moderate): Secondary | ICD-10-CM | POA: Diagnosis not present

## 2018-03-06 DIAGNOSIS — D631 Anemia in chronic kidney disease: Secondary | ICD-10-CM | POA: Diagnosis not present

## 2018-03-06 DIAGNOSIS — R809 Proteinuria, unspecified: Secondary | ICD-10-CM | POA: Diagnosis not present

## 2018-03-06 DIAGNOSIS — I10 Essential (primary) hypertension: Secondary | ICD-10-CM | POA: Diagnosis not present

## 2018-03-21 ENCOUNTER — Telehealth: Payer: Self-pay | Admitting: Cardiovascular Disease

## 2018-03-21 NOTE — Telephone Encounter (Signed)
   Cardiac Questionnaire:    Since your last visit or hospitalization:    1. Have you been having new or worsening chest pain? No   2. Have you been having new or worsening shortness of breath? No 3. Have you been having new or worsening leg swelling, wt gain, or increase in abdominal girth (pants fitting more tightly)? No   4. Have you had any passing out spells? No    Sending to reschedule pool

## 2018-03-25 ENCOUNTER — Ambulatory Visit: Payer: Medicare HMO | Admitting: Cardiovascular Disease

## 2018-03-26 ENCOUNTER — Telehealth: Payer: Self-pay | Admitting: Cardiovascular Disease

## 2018-03-26 NOTE — Telephone Encounter (Signed)
TELEPHONE CALL NOTE  Candace Monroe has been deemed a candidate for a follow-up tele-health visit to limit community exposure during the Covid-19 pandemic. I spoke with the patient via phone to ensure availability of phone/video source, confirm preferred email & phone number, discuss instructions and expectations, and review consent.   I reminded Candace Monroe to be prepared with any vital sign and/or heart rhythm information that could potentially be obtained via home monitoring, at the time of her visit.  Finally, I reminded Candace Monroe to expect an e-mail containing a link for their video-based visit approximately 15 minutes before her visit, or alternatively, a phone call at the time of her visit if her visit is planned to be a phone encounter.  Did the patient verbally consent to treatment as below? Candace Monroe 03/26/2018 1:00 PM  DOWNLOADING THE SOFTWARE (If applicable)  Download the [employer] app to enable video and telephone visits with your [facility] Provider.   Instructions for downloading [employer] WebEx: - Go to https://www.webex.com/downloads.html and follow the instructions - If you have technical difficulties with downloading WebEx, please call WebEx at [phone]. - Once the app is downloaded (can be done on either mobile or desktop computer), go to Settings in the upper left hand corner.  Be sure that camera and audio are enabled.  - You will receive an email message with a link to the meeting with a time to join for your tele-health visit.  - Please download the app and have settings configured prior to the appointment time.    CONSENT FOR TELE-HEALTH VISIT - PLEASE REVIEW  I hereby voluntarily request, consent and authorize [facility] and its employed or contracted physicians, physician assistants, nurse practitioners or other licensed health care professionals (the Practitioner), to provide me with telemedicine health care services (the "Services")  as deemed necessary by the treating Practitioner. I acknowledge and consent to receive the Services by the Practitioner via telemedicine. I understand that the telemedicine visit will involve communicating with the Practitioner through live audiovisual communication technology and the disclosure of certain medical information by electronic transmission. I acknowledge that I have been given the opportunity to request an in-person assessment or other available alternative prior to the telemedicine visit and am voluntarily participating in the telemedicine visit.  I understand that I have the right to withhold or withdraw my consent to the use of telemedicine in the course of my care at any time, without affecting my right to future care or treatment, and that the Practitioner or I may terminate the telemedicine visit at any time. I understand that I have the right to inspect all information obtained and/or recorded in the course of the telemedicine visit and may receive copies of available information for a reasonable fee.  I understand that some of the potential risks of receiving the Services via telemedicine include:  Marland Kitchen Delay or interruption in medical evaluation due to technological equipment failure or disruption; . Information transmitted may not be sufficient (e.g. poor resolution of images) to allow for appropriate medical decision making by the Practitioner; and/or  . In rare instances, security protocols could fail, causing a breach of personal health information.  Furthermore, I acknowledge that it is my responsibility to provide information about my medical history, conditions and care that is complete and accurate to the best of my ability. I acknowledge that Practitioner's advice, recommendations, and/or decision may be based on factors not within their control, such as incomplete or  inaccurate data provided by me or distortions of diagnostic images or specimens that may result from electronic  transmissions. I understand that the practice of medicine is not an exact science and that Practitioner makes no warranties or guarantees regarding treatment outcomes. I acknowledge that I will receive a copy of this consent concurrently upon execution via email to the email address I last provided but may also request a printed copy by calling the office of [place].    I understand that my insurance will be billed for this visit.   I have read or had this consent read to me. . I understand the contents of this consent, which adequately explains the benefits and risks of the Services being provided via telemedicine.  . I have been provided ample opportunity to ask questions regarding this consent and the Services and have had my questions answered to my satisfaction. . I give my informed consent for the services to be provided through the use of telemedicine in my medical care  By participating in this telemedicine visit I agree to the above.

## 2018-03-26 NOTE — Telephone Encounter (Signed)
Lm with spouse to call office - schedule Evisit

## 2018-03-27 ENCOUNTER — Ambulatory Visit: Payer: Self-pay

## 2018-03-27 ENCOUNTER — Telehealth (INDEPENDENT_AMBULATORY_CARE_PROVIDER_SITE_OTHER): Payer: Medicare HMO | Admitting: Cardiovascular Disease

## 2018-03-27 ENCOUNTER — Other Ambulatory Visit: Payer: Self-pay

## 2018-03-27 DIAGNOSIS — I739 Peripheral vascular disease, unspecified: Secondary | ICD-10-CM

## 2018-03-27 DIAGNOSIS — I25118 Atherosclerotic heart disease of native coronary artery with other forms of angina pectoris: Secondary | ICD-10-CM | POA: Diagnosis not present

## 2018-03-27 DIAGNOSIS — I63239 Cerebral infarction due to unspecified occlusion or stenosis of unspecified carotid arteries: Secondary | ICD-10-CM

## 2018-03-27 DIAGNOSIS — N183 Chronic kidney disease, stage 3 unspecified: Secondary | ICD-10-CM

## 2018-03-27 DIAGNOSIS — E785 Hyperlipidemia, unspecified: Secondary | ICD-10-CM | POA: Diagnosis not present

## 2018-03-27 DIAGNOSIS — R001 Bradycardia, unspecified: Secondary | ICD-10-CM

## 2018-03-27 DIAGNOSIS — Z79899 Other long term (current) drug therapy: Secondary | ICD-10-CM

## 2018-03-27 DIAGNOSIS — I69351 Hemiplegia and hemiparesis following cerebral infarction affecting right dominant side: Secondary | ICD-10-CM | POA: Diagnosis not present

## 2018-03-27 DIAGNOSIS — I6521 Occlusion and stenosis of right carotid artery: Secondary | ICD-10-CM | POA: Diagnosis not present

## 2018-03-27 DIAGNOSIS — I129 Hypertensive chronic kidney disease with stage 1 through stage 4 chronic kidney disease, or unspecified chronic kidney disease: Secondary | ICD-10-CM

## 2018-03-27 DIAGNOSIS — I10 Essential (primary) hypertension: Secondary | ICD-10-CM

## 2018-03-27 NOTE — Patient Instructions (Signed)
Medication Instructions:  We will refill all the meds 90 days with refill   If you need a refill on your cardiac medications before your next appointment, please call your pharmacy.    Lab work: No new labs needed   If you have labs (blood work) drawn today and your tests are completely normal, you will receive your results only by: Marland Kitchen MyChart Message (if you have MyChart) OR . A paper copy in the mail If you have any lab test that is abnormal or we need to change your treatment, we will call you to review the results.   Testing/Procedures: No new testing needed   Follow-Up: At [facility], you and your health needs are our priority.  As part of our continuing mission to provide you with exceptional heart care, we have created designated Provider Care Teams.  These Care Teams include your primary Cardiologist (physician) and Advanced Practice Providers (APPs -  Physician Assistants and Nurse Practitioners) who all work together to provide you with the care you need, when you need it.  . You will need a follow up appointment in 12 months .   Please call our office 2 months in advance to schedule this appointment.    . Providers on your designated Care Team:   . Murray Hodgkins, NP . Christell Faith, PA-C . Marrianne Mood, PA-C  Any Other Special Instructions Will Be Listed Below (If Applicable).  For educational health videos Log in to : [email or web address] Or : [email or web address], password : [email or web address]

## 2018-03-27 NOTE — Progress Notes (Signed)
Telephone Visit     Evaluation Performed:  Follow-up visit  This visit type was conducted due to national recommendations for restrictions regarding the COVID-19 Pandemic (e.g. social distancing).  This format is felt to be most appropriate for this patient at this time.  All issues noted in this document were discussed and addressed.  No physical exam was performed (except for noted visual exam findings with Telehealth visits). Video conference/ WebCam visit with  [email or web address]. see MyChart message from today for the patient's consent to telehealth for [place].  Date:  03/27/2018   ID:  Candace Monroe, DOB 11-19-1940, MRN [id number]  Patient Location:  [place]  [id number]   Provider location:   [facility], [place] office  PCP:  Crecencio Mc, MD  Cardiologist:  Ida Rogue, MD   Chief Complaint:  CAD, stable angina    History of Present Illness:    Candace Monroe is a 78 y.o. female who presents via audio/video conferencing for a telehealth visit today.   The patient does not symptoms concerning for COVID-19 infection (fever, chills, cough, or new SHORTNESS OF BREATH).  Please refer to prior office visit for complete details:  Patient has a past medical history of Diabetes CKD CVA, residual right-sided hemiparesis, 10/2013  former smoker, 50 yrs, COPD, quit 5 yrs ago (since the CVA) hyperlipidemia  Carotid stenosis on the right normocytic anemia/anemia of chronic kidney disease. Severe carotid stenosis  Moderate to severe left main, critical ostial LAD and mid LAD disease Severe mid circumflex, moderate ostial circumflex  Would be a very challenging case given left main and ostial LAD stenosis Preference would be consideration of CABG Would also need to consider management/surgery of severe carotid disease Given severity of all of still LAD disease and left main disease recommendation is to transfer patient directly from [facility] to [facility].    Evaluated at [facility]  too high risk for surgical intervention.   Most of the details today provided by the daughter who is holding the WebCam She obtained vitals for our discussion today  Weight 110 130/74, pulse 60  Extensive review of her medications Compliant with her meds, takes each separate no pill box  Daughter reports she is eating but not as much Lost 6 pounds  Husband does cooking  Long discussion concerning her meetings with doctors in [place] concerning coronary disease, severe carotid disease Denies having any TIA symptoms Denies having any chest pain concerning for angina   Prior CV studies:   The following studies were reviewed today:  Echocardiogram - Left ventricle: The cavity size was normal. Wall thickness was increased in a pattern of mild LVH. Systolic function was normal. The estimated ejection fraction was in the range of 55% to 60%.    Past Medical History:  Diagnosis Date  . Anemia   . Arthritis   . CAD (coronary artery disease)    a. LHC 12/19: m-d left main 75%, oLAD 99%, mLAD 90%, oLCx 60%, pLCx 95%, mLCx 90%, EF 55 to 65%, not a candidate for CABG or PCI, medical management  . Carotid artery occlusion    Right side  . Chronic kidney disease (CKD), stage III (moderate) (HCC)   . Depression   . Diabetes mellitus with complication ([place])   . Diastolic dysfunction    a. TTE 12/19: EF of 55 to 60%, mild LVH, no regional wall motion abnormalities, grade 1 diastolic dysfunction, calcified mitral annulus, trivial pericardial effusion  .  Diverticulitis   . GERD (gastroesophageal reflux disease)   . HTN (hypertension)   . Hyperlipidemia   . Stroke ([place]) Sept-Oct 2015  . Urine incontinence    Past Surgical History:  Procedure Laterality Date  . ABDOMINAL HYSTERECTOMY  1973  . CHOLECYSTECTOMY  1990  . COLONOSCOPY WITH PROPOFOL N/A 07/16/2017   Procedure: COLONOSCOPY WITH PROPOFOL;  Surgeon: Lucilla Lame, MD;  Location: [facility]  ENDOSCOPY;  Service: Endoscopy;  Laterality: N/A;  . LEFT HEART CATH AND CORONARY ANGIOGRAPHY Left 12/19/2017   Procedure: LEFT HEART CATH AND CORONARY ANGIOGRAPHY;  Surgeon: Minna Merritts, MD;  Location: [place] CV LAB;  Service: Cardiovascular;  Laterality: Left;  . TONSILLECTOMY       No outpatient medications have been marked as taking for the 03/27/18 encounter (Appointment) with Minna Merritts, MD.     Allergies:   Ace inhibitors; Augmentin [amoxicillin-pot clavulanate]; and Sulfa antibiotics   Social History   Tobacco Use  . Smoking status: Former Smoker    Years: 50.00    Last attempt to quit: 09/01/2013    Years since quitting: 4.5  . Smokeless tobacco: Never Used  Substance Use Topics  . Alcohol use: No    Alcohol/week: 0.0 standard drinks  . Drug use: No     Family Hx: The patient's family history includes Cancer (age of onset: 83) in her sister; Diabetes in her brother and son; Seizures in her sister; Stroke in her mother.  ROS:   Please see the history of present illness.    Review of Systems  Constitutional: Negative.   Respiratory: Negative.   Cardiovascular: Negative.   Gastrointestinal: Negative.   Musculoskeletal: Positive for joint pain.       Gait instability  Neurological: Negative.   Psychiatric/Behavioral: Negative.   All other systems reviewed and are negative.    Labs/Other Tests and Data Reviewed:    Recent Labs: 06/21/2017: ALT 12 01/02/2018: BUN 25; Creatinine, Ser 1.33; Potassium 4.6; Sodium 145 02/10/2018: Hemoglobin 11.8; Platelets 214   Recent Lipid Panel Lab Results  Component Value Date/Time   CHOL 113 12/20/2017 03:34 AM   CHOL 203 (H) 09/30/2013 04:34 AM   TRIG 80 12/20/2017 03:34 AM   TRIG 189 09/30/2013 04:34 AM   HDL 35 (L) 12/20/2017 03:34 AM   HDL 32 (L) 09/30/2013 04:34 AM   CHOLHDL 3.2 12/20/2017 03:34 AM   LDLCALC 62 12/20/2017 03:34 AM   LDLCALC 133 (H) 09/30/2013 04:34 AM   LDLDIRECT 73.0 02/22/2016 10:15  AM    Wt Readings from Last 3 Encounters:  01/02/18 116 lb (52.6 kg)  12/19/17 126 lb (57.2 kg)  12/12/17 120 lb (54.4 kg)     Exam:    Vital Signs:  There were no vitals taken for this visit.  Weight 110 130/74, pulse 60  Well nourished, well developed female in no acute distress. Sitting upright,  HEENT grossly benign, no significant lower extremity swelling No abdominal bloating    ASSESSMENT & PLAN:     Coronary artery disease of native artery of native heart with stable angina pectoris (HCC) Severe diffuse disease, medical management  Evaluated at [facility], high risk for bypass surgery Surprisingly denies having any anginal symptoms She is very inactive only walks a little bit around her house Will continue aggressive medical management --- Long discussion with patient and daughter that blockages will not go away, they have not gone anywhere, we will continue medical management for comfort She does have nitro sublingual that she  can take but she has not had to take this recently  PAD (peripheral artery disease) ([place]) Severe carotid stenosis --Again discussed with patient and daughter that blockage has not gone away Medical management  Carotid artery stenosis with cerebral infarction [facility]) Declined surgery given her age and comorbidities Aggressive medical management  CKD (chronic kidney disease) stage 3, GFR 30-59 ml/min (HCC) She will need periodic lab work Will wait until virus outbreak is complete as she is high risk  Essential hypertension Blood pressure is well controlled on today's visit. No changes made to the medications.  Hyperlipidemia LDL goal <70 Continue statin   COVID-19 Education: The signs and symptoms of COVID-19 were discussed with the patient and how to seek care for testing (follow up with PCP or arrange E-visit).  The importance of social distancing was discussed today.  Patient Risk:   After full review of this patients clinical status,  I feel that they are at least moderate risk at this time.  Time:   Today, I have spent 25 minutes with the patient with telehealth technology discussing PAD carotid disease coronary disease, discussed previous catheterization results carotid stenosis details Meetings with doctors in [place] who felt medical management was best option Discussed medications in detail   Medication Adjustments/Labs and Tests Ordered: Current medicines are reviewed at length with the patient today.  Concerns regarding medicines are outlined above.   Tests Ordered: No tests ordered   Medication Changes: No changes made   Disposition: Follow-up in 6 months   Signed, Ida Rogue, MD  03/27/2018 4:07 PM    [place] Office [address], [place], [place] [address]

## 2018-03-30 IMAGING — DX DG ANKLE COMPLETE 3+V*R*
3 series · 3 of 3 positions shown · non-contrast
Comparison: No prior right ankle imaging. Right foot x-rays
02/21/2009 are correlated.

CLINICAL DATA: 75-year-old with acute onset of right ankle pain 3-4
days ago after a twisting injury while sitting. Initial encounter.

EXAM:
RIGHT ANKLE - COMPLETE 3+ VIEW

[ankle ap]
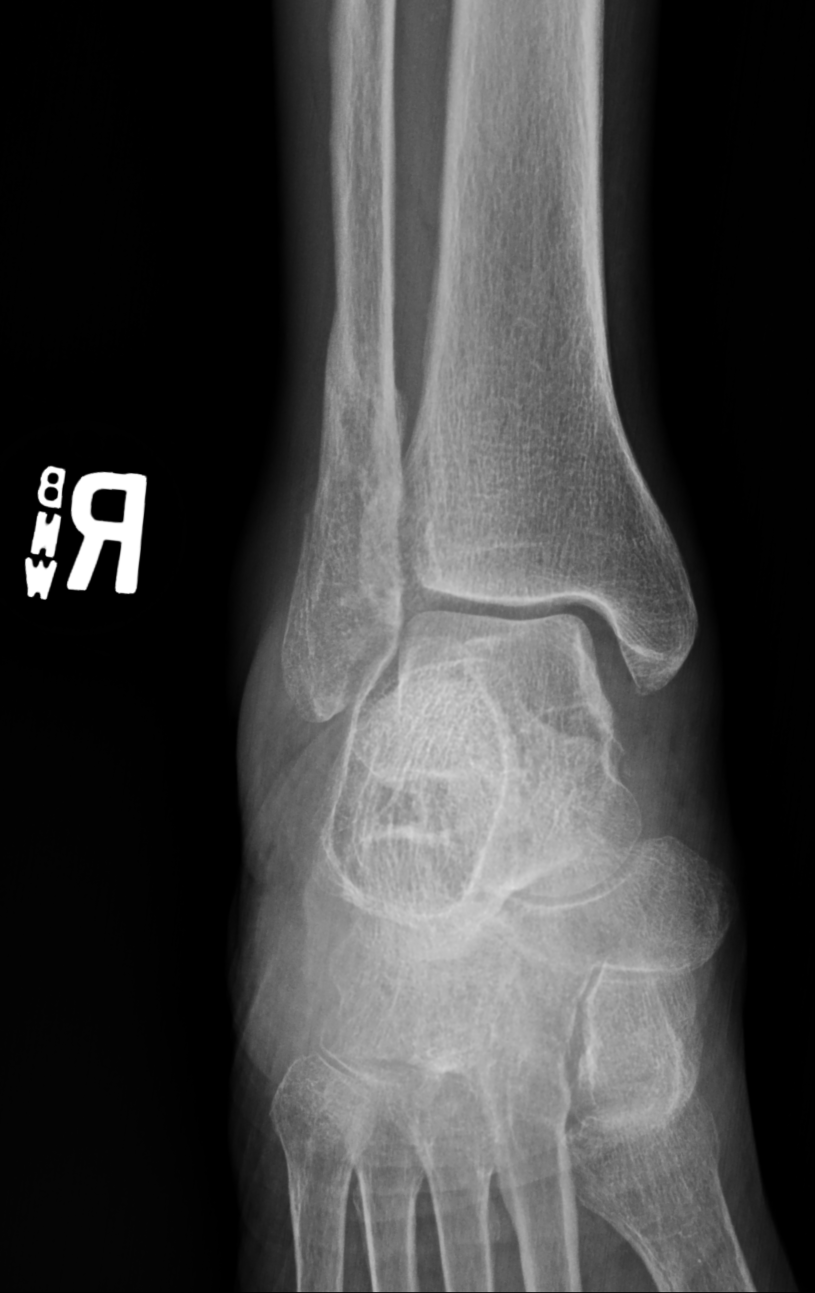

[ankle obl (oblique)]
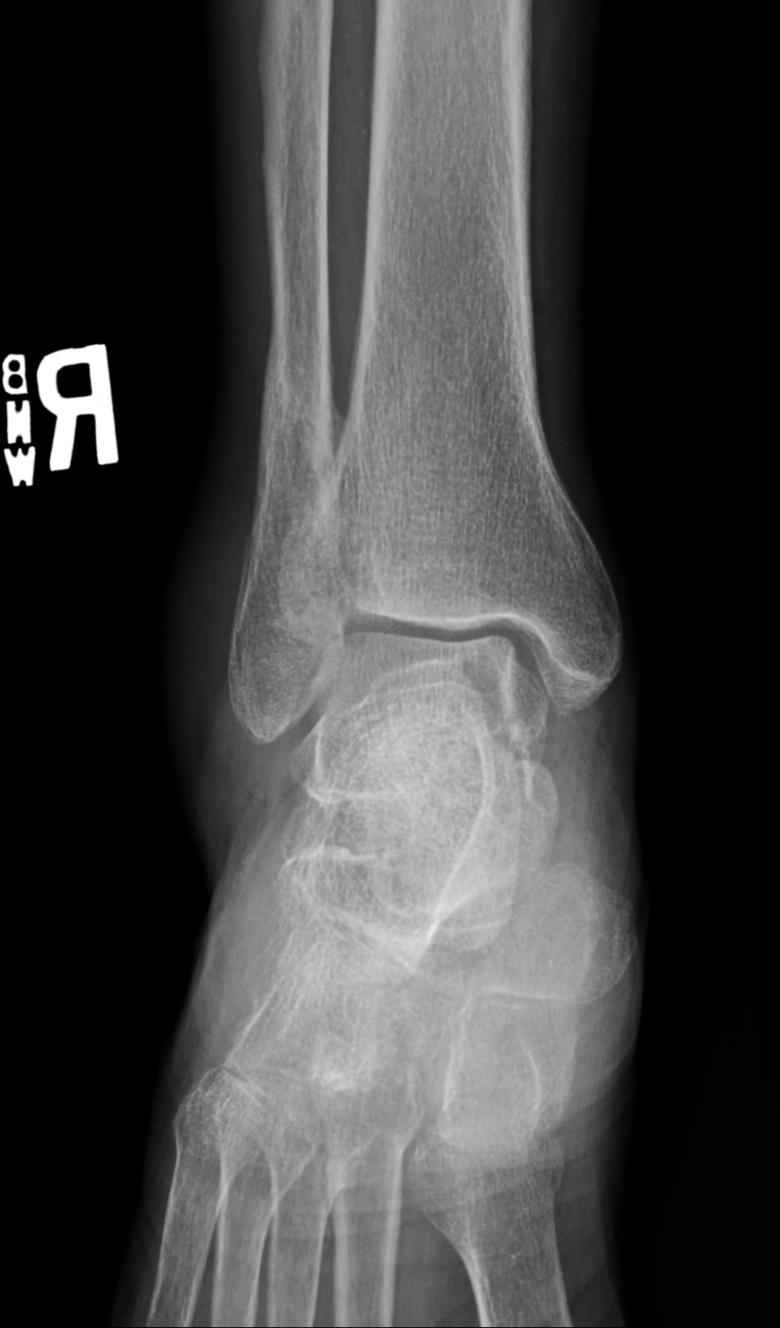

[ankle lat]
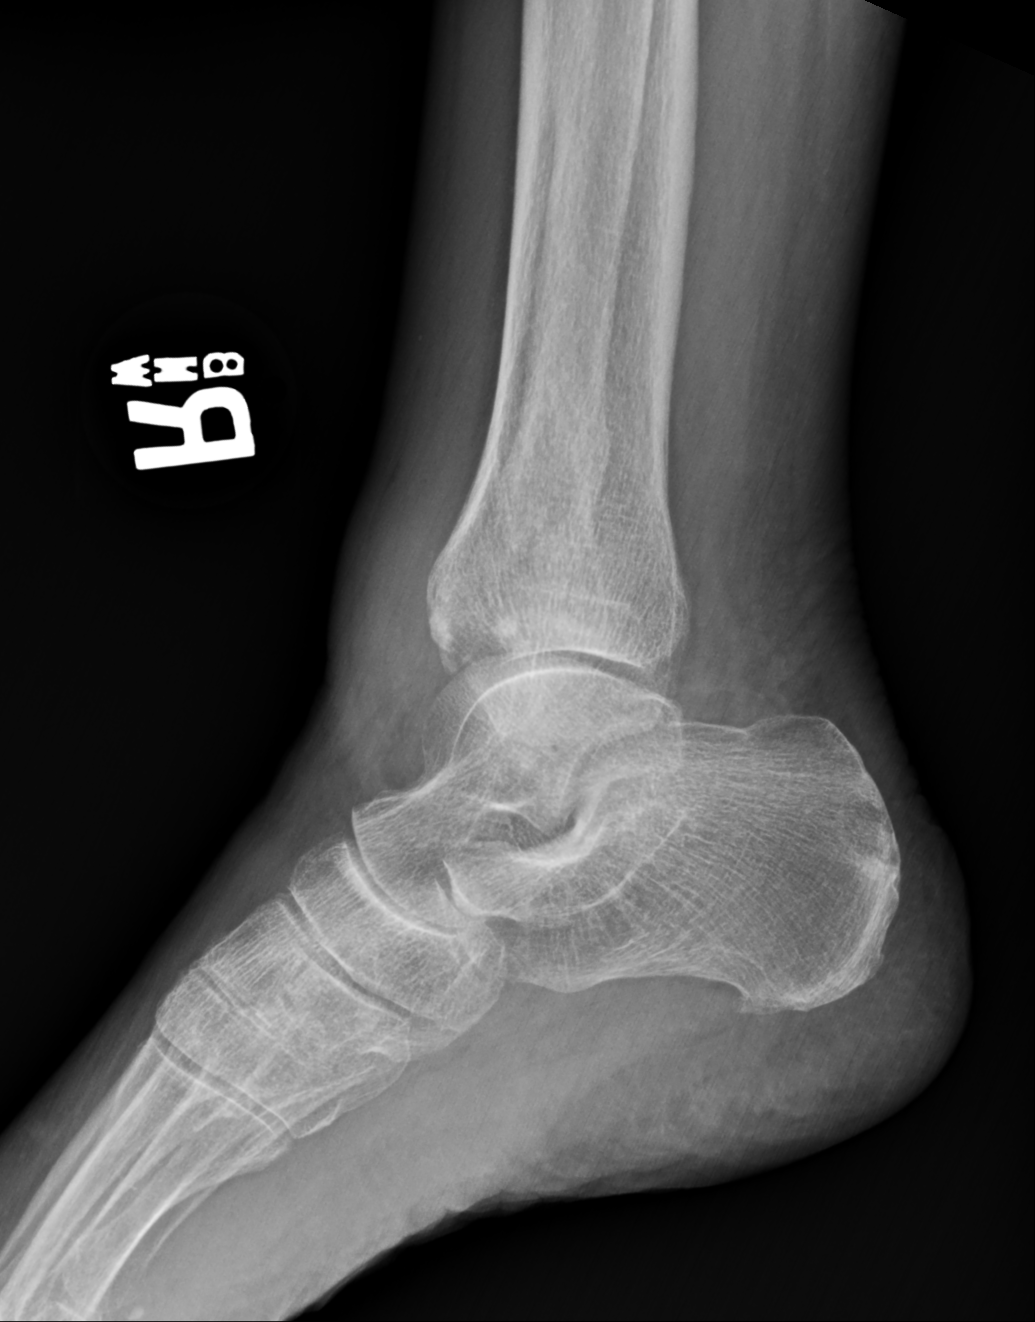

[3 of 3 positions shown; findings below may reference images not displayed]

FINDINGS: Nondisplaced fracture involving the distal fibula above the lateral
malleolus. No other acute fractures. Likely old fracture with mature
callus involving the distal fibula above the acute fracture. Ankle
mortise intact with well-preserved joint space. Bone mineral density
relatively well preserved for age. Small plantar calcaneal spur.
Extensive lateral and dorsal soft tissue swelling.
IMPRESSION: Acute nondisplaced fracture involving the distal fibula just above
the lateral malleolus.

## 2018-04-02 DIAGNOSIS — R69 Illness, unspecified: Secondary | ICD-10-CM | POA: Diagnosis not present

## 2018-04-09 ENCOUNTER — Ambulatory Visit: Payer: Medicare HMO

## 2018-05-02 DIAGNOSIS — R69 Illness, unspecified: Secondary | ICD-10-CM | POA: Diagnosis not present

## 2018-05-16 ENCOUNTER — Other Ambulatory Visit: Payer: Self-pay | Admitting: Internal Medicine

## 2018-06-01 ENCOUNTER — Other Ambulatory Visit: Payer: Self-pay | Admitting: Oncology

## 2018-06-09 ENCOUNTER — Other Ambulatory Visit: Payer: Medicare HMO

## 2018-06-09 ENCOUNTER — Ambulatory Visit: Payer: Medicare HMO | Admitting: Oncology

## 2018-06-12 ENCOUNTER — Telehealth: Payer: Self-pay

## 2018-06-12 MED ORDER — ATORVASTATIN CALCIUM 80 MG PO TABS: 80.0000 mg | ORAL_TABLET | Freq: Every day | ORAL | 3 refills | Status: DC

## 2018-06-12 MED ORDER — AMLODIPINE BESYLATE 5 MG PO TABS: 5.0000 mg | ORAL_TABLET | Freq: Every day | ORAL | 3 refills | Status: DC

## 2018-06-12 NOTE — Telephone Encounter (Signed)
Requested Prescriptions   Signed Prescriptions Disp Refills  . amLODipine (NORVASC) 5 MG tablet 30 tablet 3    Sig: Take 1 tablet (5 mg total) by mouth daily.    Authorizing Provider: Minna Merritts    Ordering User: NEWCOMER MCCLAIN, Emillee Talsma L  . atorvastatin (LIPITOR) 80 MG tablet 30 tablet 3    Sig: Take 1 tablet (80 mg total) by mouth daily at 6 PM.    Authorizing Provider: Minna Merritts    Ordering User: Raelene Bott, Susie Pousson L

## 2018-06-16 ENCOUNTER — Ambulatory Visit: Payer: Medicare HMO

## 2018-06-16 ENCOUNTER — Other Ambulatory Visit: Payer: Self-pay

## 2018-07-10 DIAGNOSIS — D631 Anemia in chronic kidney disease: Secondary | ICD-10-CM | POA: Diagnosis not present

## 2018-07-10 DIAGNOSIS — I10 Essential (primary) hypertension: Secondary | ICD-10-CM | POA: Diagnosis not present

## 2018-07-10 DIAGNOSIS — N2581 Secondary hyperparathyroidism of renal origin: Secondary | ICD-10-CM | POA: Diagnosis not present

## 2018-07-10 DIAGNOSIS — N183 Chronic kidney disease, stage 3 (moderate): Secondary | ICD-10-CM | POA: Diagnosis not present

## 2018-07-16 ENCOUNTER — Ambulatory Visit (INDEPENDENT_AMBULATORY_CARE_PROVIDER_SITE_OTHER): Payer: Medicare HMO

## 2018-07-16 ENCOUNTER — Other Ambulatory Visit: Payer: Self-pay

## 2018-07-16 DIAGNOSIS — Z0000 Encounter for general adult medical examination without abnormal findings: Secondary | ICD-10-CM | POA: Diagnosis not present

## 2018-07-16 NOTE — Progress Notes (Addendum)
Subjective:   Candace Monroe is a 78 y.o. female who presents for Medicare Annual (Subsequent) preventive examination.  Review of Systems:  No ROS.  Medicare Wellness Virtual Visit.  Visual/audio telehealth visit, UTA vital signs.   See social history for additional risk factors.   Cardiac Risk Factors include: advanced age (>45mn, >>47women);hypertension;diabetes mellitus     Objective:     Vitals: There were no vitals taken for this visit.  There is no height or weight on file to calculate BMI.  Advanced Directives 07/16/2018 12/20/2017 12/19/2017 10/07/2017 07/16/2017 04/01/2017 03/25/2017  Does Patient Have a Medical Advance Directive? _0  No Yes  Type of Advance Directive - - - - - - Living will  Does patient want to make changes to medical advance directive? - - - - - No - Patient declined No - Patient declined  Would patient like information on creating a medical advance directive? No - Patient declined No - Patient declined No - Patient declined No - Patient declined No - Patient declined No - Patient declined -    Tobacco Social History   Tobacco Use  Smoking Status Former Smoker  . Years: 50.00  . Quit date: 09/01/2013  . Years since quitting: 4.8  Smokeless Tobacco Never Used     Counseling given: Not Answered   Clinical Intake:  Pre-visit preparation completed: Yes        Diabetes: Yes(Followed by pcp)  How often do you need to have someone help you when you read instructions, pamphlets, or other written materials from your doctor or pharmacy?: 2 - Rarely  Interpreter Needed?: No     Past Medical History:  Diagnosis Date  . Anemia   . Arthritis   . CAD (coronary artery disease)    a. LHC 12/19: m-d left main 75%, oLAD 99%, mLAD 90%, oLCx 60%, pLCx 95%, mLCx 90%, EF 55 to 65%, not a candidate for CABG or PCI, medical management  . Carotid artery occlusion    Right side  . Chronic kidney disease (CKD), stage III (moderate) (HCC)   .  Depression   . Diabetes mellitus with complication ([place]   . Diastolic dysfunction    a. TTE 12/19: EF of 55 to 60%, mild LVH, no regional wall motion abnormalities, grade 1 diastolic dysfunction, calcified mitral annulus, trivial pericardial effusion  . Diverticulitis   . GERD (gastroesophageal reflux disease)   . HTN (hypertension)   . Hyperlipidemia   . [place] Sept[place]Oct [place]  . Urine incontinence    Past Surgical History:  Procedure Laterality Date  . ABDOMINAL HYSTERECTOMY  1973  . CHOLECYSTECTOMY  1990  . COLONOSCOPY WITH PROPOFOL N/A 07/16/2017   Procedure: COLONOSCOPY WITH PROPOFOL;  Surgeon: WLucilla Lame MD;  [facility];  Service: Endoscopy;  Laterality: N/A;  . LEFT HEART CATH AND CORONARY ANGIOGRAPHY Left 12/19/2017   Procedure: LEFT HEART CATH AND CORONARY ANGIOGRAPHY;  Surgeon: GMinna Merritts MD;  [facility];  Service: Cardiovascular;  Laterality: Left;  . TONSILLECTOMY     Family History  Problem Relation Age of Onset  . Stroke Mother   . Cancer Sister 668      breast  . Diabetes Brother   . Seizures Sister   . Diabetes Son    Social History   Socioeconomic History  . Marital status: Married    Spouse name: Not on file  . Number of children: Not on file  .  Years of education: Not on file  . Highest education level: Not on file  Occupational History  . Not on file  Social Needs  . Financial resource strain: Not hard at all  . Food insecurity    Worry: Never true    Inability: Never true  . Transportation needs    Medical: No    Non-medical: No  Tobacco Use  . Smoking status: Former Smoker    Years: 50.00    Quit date: 09/01/2013    Years since quitting: 4.8  . Smokeless tobacco: Never Used  Substance and Sexual Activity  . Alcohol use: No    Alcohol/week: 0.0 standard drinks  . Drug use: No  . Sexual activity: Never  Lifestyle  . Physical activity    Days per week: Not on file    Minutes per session:  Not on file  . Stress: Not on file  Relationships  . Social Herbalist on phone: Not on file    Gets together: Not on file    Attends religious service: Not on file    Active member of club or organization: Not on file    Attends meetings of clubs or organizations: Not on file    Relationship status: Not on file  Other Topics Concern  . Not on file  Social History Narrative  . Not on file    Outpatient Encounter Medications as of 07/16/2018  Medication Sig  . amitriptyline (ELAVIL) 10 MG tablet TAKE 1 TABLET BY MOUTH AT BEDTIME AS NEEDED FOR INSOMNIA  . amLODipine (NORVASC) 5 MG tablet Take 1 tablet (5 mg total) by mouth daily.  Marland Kitchen aspirin 81 MG tablet Take 81 mg by mouth daily.  Marland Kitchen atorvastatin (LIPITOR) 80 MG tablet Take 1 tablet (80 mg total) by mouth daily at 6 PM.  . blood glucose meter kit and supplies Please dispense One Touch meter, E11.22  . calcitRIOL (ROCALTROL) 0.25 MCG capsule Take 0.25 mcg by mouth daily.  . carvedilol (COREG) 3.125 MG tablet Take 1 tablet (3.125 mg total) by mouth 2 (two) times daily with a meal.  . clopidogrel (PLAVIX) 75 MG tablet Take 1 tablet (75 mg total) by mouth daily.  . CV[employer] VITAMIN B12 1000 MCG tablet TAKE 1 TABLET BY MOUTH EVERY DAY  . doxazosin (CARDURA) 2 MG tablet TAKE 1 TABLET BY MOUTH EVERY DAY  . famotidine (PEPCID) 20 MG tablet TAKE 1 TABLET BY MOUTH EVERY DAY  . fexofenadine (ALLEGRA) 180 MG tablet TAKE 1 TABLET BY MOUTH EVERY DAY  . fluticasone (FLONASE) 50 MCG/ACT nasal spray Place 2 sprays into both nostrils daily as needed for allergies.   Marland Kitchen glucose blood test strip Use as instructed, one touch to check blood glucose up to 3 times a day. E11.22  . hydrALAZINE (APRESOLINE) 25 MG tablet TAKE 1 TABLET BY MOUTH THREE TIMES A DAY  . Lancets (FREESTYLE) lancets USE AS DIRECTED ONCE DAILY  . losartan (COZAAR) 100 MG tablet Take 1 tablet (100 mg total) by mouth daily.  . nitroGLYCERIN (NITROSTAT) 0.4 MG SL tablet Place 1 tablet  (0.4 mg total) under the tongue every 5 (five) minutes x 3 doses as needed for chest pain. (Patient not taking: Reported on 07/16/2018)   No facility-administered encounter medications on file as of 07/16/2018.     Activities of Daily Living In your present state of health, do you have any difficulty performing the following activities: 07/16/2018 12/20/2017  Hearing? N Y  Vision? N N  Difficulty concentrating or making decisions? N N  Walking or climbing stairs? Y N  Comment Walker in use when ambulating. Unsteady gait. -  Dressing or bathing? N N  Doing errands, shopping? Y Y  Comment She does not drive. -  Preparing Food and eating ? Y -  Comment Husband cooks. She self feeds. -  Using the Toilet? Y -  Comment BSC in use. -  In the past six months, have you accidently leaked urine? Y -  Comment She does wear a depend brief. -  Do you have problems with loss of bowel control? N -  Managing your Medications? N -  Managing your Finances? Y -  Comment Husband manages -  Housekeeping or managing your Housekeeping? Y -  Comment Husband manages -  Some recent data might be hidden    Patient Care Team: Crecencio Mc, MD as PCP - General (Internal Medicine) Minna Merritts, MD as PCP - Cardiology (Cardiology) Birdie Sons, MD as Referring Physician (Family Medicine) Bary Castilla [place] Gleason, MD (General Surgery)    Assessment:   This is a routine wellness examination for Shylie.  I connected with patient 07/16/18 at 3:45 PM per request by a video/audio enabled telemedicine application and verified that I am speaking with the correct person using two identifiers. Patient stated full name and DOB. Patient gave permission to continue with virtual visit. Patient's location was at home and Nurse's location was at [place] office.   Health Screenings  Mammogram - 04/2014 Colonoscopy - 07/2017 Bone Density - 05/2014 Glaucoma -none Hearing -demonstrates normal hearing during visit.  Hemoglobin A1C - 12/2017 Cholesterol - 12/2017 Dental- dentures Vision- UTD. Wears glasses.   Social  Alcohol intake - no          Smoking history-  former    Smokers in home? none Illicit drug use? none Physical activity- No routine Diet - healthy  Sexually Active -never BMI- discussed the importance of a healthy diet, water intake and the benefits of aerobic exercise.  Educational material provided.   Safety  Patient feels safe at home- yes Patient does have smoke detectors at home- yes Patient does wear sunscreen or protective clothing when in direct sunlight -yes Patient does wear seat belt when in a moving vehicle -yes Patient drives- no Adequate lighting- yes Hallways free of throw rugs and electrical cords etc- yes Life alert- no  Covid-19 precautions and sickness symptoms discussed.   Activities of Daily Living Patient denies needing assistance with: feeding themselves, getting from bed to chair, getting to the bed side commode, bathing/showering or dressing.  Husband manages money, prepares meals and drives.  No new identified risk were noted.    Depression Screen Patient denies losing interest in daily life, feeling hopeless, or crying easily over simple problems.   Medication-taking as directed and without issues.   Fall Screen Patient denies being afraid of falling or falling in the last year.  Ambulates with walker inside and outside of the home.   Memory Screen Patient is alert.  Correctly identified the president of the [place], season and recall. Patient likes to read and complete crossword puzzles for brain stimulation.  Immunizations The following Immunizations were discussed: Influenza, shingles, pneumonia, and tetanus.   Other Providers Patient Care Team: Crecencio Mc, MD as PCP - General (Internal Medicine) Minna Merritts, MD as PCP - Cardiology (Cardiology) Birdie Sons, MD as Referring Physician (Family Medicine) Bary Castilla [place] Gleason,  MD (General Surgery)  Exercise  Activities and Dietary recommendations Current Exercise Habits: The patient does not participate in regular exercise at present  Goals      Patient Stated   . Exercise (pt-stated)     Use free weights with home gym for strength training exercises       Fall Risk Fall Risk  07/16/2018 08/19/2017 03/25/2017 03/22/2016 12/01/2014  Falls in the past year? 0 No No No Yes  Number falls in past yr: - - - - 1  Injury with Fall? - - - - No  Risk for fall due to : - - - - Impaired mobility  Risk for fall due to: Comment - - - - -  Follow up - - - - Falls prevention discussed  Depression Screen PHQ 2/9 Scores 07/16/2018 08/19/2017 03/25/2017 03/22/2016  PHQ - 2 Score 0 0 0 0     Cognitive Function     6CIT Screen 07/16/2018 03/22/2016  What Year? 0 points 0 points  What month? 0 points 0 points  What time? 0 points 0 points  Count back from 20 0 points 0 points  Months in reverse 0 points 0 points  Repeat phrase 0 points 0 points  Total Score 0 0    Immunization History  Administered Date(s) Administered  . Influenza, High Dose Seasonal PF 09/29/2014  . Influenza-Unspecified 10/01/2013, 10/12/2015, 09/25/2016  . Pneumococcal Conjugate-13 03/25/2017  . Pneumococcal Polysaccharide-23 05/31/2015   Screening Tests Health Maintenance  Topic Date Due  . TETANUS/TDAP  04/15/1957  . OPHTHALMOLOGY EXAM  12/13/2016  . FOOT EXAM  12/20/2017  . HEMOGLOBIN A1C  06/21/2018  . INFLUENZA VACCINE  08/02/2018  . COLONOSCOPY  07/16/2020  . DEXA SCAN  Completed  . PNA vac Low Risk Adult  Completed      Plan:   End of life planning; Advanced aging; Advanced directives discussed.  No HCPOA/Living Will.  Additional information declined at this time.  I have personally reviewed and noted the following in the patient's chart:   . Medical and social history . Use of alcohol, tobacco or illicit drugs  . Current medications and supplements . Functional ability and  status . Nutritional status . Physical activity . Advanced directives . List of other physicians . Hospitalizations, surgeries, and ER visits in previous 12 months . Vitals . Screenings to include cognitive, depression, and falls . Referrals and appointments  In addition, I have reviewed and discussed with patient certain preventive protocols, quality metrics, and best practice recommendations. A written personalized care plan for preventive services as well as general preventive health recommendations were provided to patient.     OBrien-Blaney, Danyell Awbrey L, LPN  02/07/6189   I have reviewed the above information and agree with above.   Deborra Medina, MD

## 2018-07-16 NOTE — Patient Instructions (Addendum)
  Candace Monroe , Thank you for taking time to come for your Medicare Wellness Visit. I appreciate your ongoing commitment to your health goals. Please review the following plan we discussed and let me know if I can assist you in the future.   These are the goals we discussed: Goals      Patient Stated   . Exercise (pt-stated)     Use free weights with home gym for strength training exercises       This is a list of the screening recommended for you and due dates:  Health Maintenance  Topic Date Due  . Tetanus Vaccine  04/15/1957  . Eye exam for diabetics  12/13/2016  . Complete foot exam   12/20/2017  . Hemoglobin A1C  06/21/2018  . Flu Shot  08/02/2018  . Colon Cancer Screening  07/16/2020  . DEXA scan (bone density measurement)  Completed  . Pneumonia vaccines  Completed

## 2018-07-22 ENCOUNTER — Other Ambulatory Visit: Payer: Self-pay | Admitting: Internal Medicine

## 2018-08-12 DIAGNOSIS — H04123 Dry eye syndrome of bilateral lacrimal glands: Secondary | ICD-10-CM | POA: Diagnosis not present

## 2018-08-12 DIAGNOSIS — E113392 Type 2 diabetes mellitus with moderate nonproliferative diabetic retinopathy without macular edema, left eye: Secondary | ICD-10-CM | POA: Diagnosis not present

## 2018-08-12 DIAGNOSIS — E113291 Type 2 diabetes mellitus with mild nonproliferative diabetic retinopathy without macular edema, right eye: Secondary | ICD-10-CM | POA: Diagnosis not present

## 2018-08-12 DIAGNOSIS — H43823 Vitreomacular adhesion, bilateral: Secondary | ICD-10-CM | POA: Diagnosis not present

## 2018-08-15 ENCOUNTER — Other Ambulatory Visit: Payer: Self-pay | Admitting: Internal Medicine

## 2018-08-15 NOTE — Telephone Encounter (Signed)
Last OV with PCP 08/19/17 ok to [place]?

## 2018-08-21 ENCOUNTER — Other Ambulatory Visit: Payer: Self-pay | Admitting: Internal Medicine

## 2018-08-26 ENCOUNTER — Telehealth: Payer: Self-pay

## 2018-08-26 ENCOUNTER — Ambulatory Visit: Payer: Medicare HMO | Admitting: Internal Medicine

## 2018-08-26 ENCOUNTER — Ambulatory Visit: Payer: Self-pay | Admitting: *Deleted

## 2018-08-26 NOTE — Telephone Encounter (Signed)
Pt is scheduled for a telephone visit tomorrow. Pt's husband is aware.

## 2018-08-26 NOTE — Telephone Encounter (Signed)
Summary: toe nail coming off    Pt called and stated that her left foot big toe nail is coming off and would like to know what she should do. Please advise

## 2018-08-26 NOTE — Telephone Encounter (Signed)
Called and spoke to patient's daughter, Rosann Auerbach.  Patient's daughter said that pt's toenail is falling off.  Pt's daughter is not sure if toenail looks infected.  Called pt to triage and to schedule an appt.  No answer.  LMTCB twice.  Please advise.

## 2018-08-26 NOTE — Telephone Encounter (Signed)
Copied from [place]. Topic: General - Call Back - No Documentation >> Aug 25, 2018  5:20 PM Erick Blinks wrote: Calling in regards to pt's toenail, daughter is requesting call back. Best contact: Lou Cal [phone] VM available

## 2018-08-26 NOTE — Telephone Encounter (Signed)
Spoke with pt's husband and scheduled the pt a telephone visit for tomorrow.

## 2018-08-26 NOTE — Telephone Encounter (Signed)
Pt reports toenail of left great toe "Is coming off a little, at back."  States "A little loose for 3 weeks now." Denies any redness, no swelling or warmth, no pain or tenderness. States nail "Looks normal, just loose." States has remained the same  over past 3 weeks, no worsening. Does not recall any injury. States "I was a diabetic but not anymore."  Home care advise give per protocol.   Assured [place] would alert practice for  Dr. Demetrios Isaacs review and if any additional instructions she would hear from PCP.    CB# [phone] Reason for Disposition . [1] Toenail comes off or is almost off AND [2] follows old injury  Answer Assessment - Initial Assessment Questions 1. MECHANISM: "How did the injury happen?"      *No Answer* 2. ONSET: "When did the injury happen?" (Minutes or hours ago)      *No Answer* 3. LOCATION: "What part of the toe is injured?" "Is the nail damaged?"      *No Answer* 4. APPEARANCE of TOE INJURY: "What does the injury look like?"      *No Answer* 5. SEVERITY: "Can you use the foot normally?" "Can you walk?"      *No Answer* 6. SIZE: For cuts, bruises, or swelling, ask: "How large is it?" (e.g., inches or centimeters;  entire toe)      *No Answer* 7. PAIN: "Is there pain?" If so, ask: "How bad is the pain?"   (e.g., Scale 1-10; or mild, moderate, severe)     *No Answer* 8. TETANUS: For any breaks in the skin, ask: "When was the last tetanus booster?"     *No Answer* 9. DIABETES: "Do you have a history of diabetes or poor circulation in the feet?"     *No Answer* 10. OTHER SYMPTOMS: "Do you have any other symptoms?"        *No Answer*  Protocols used: TOE INJURY-A-AH

## 2018-08-26 NOTE — Telephone Encounter (Signed)
Copied from [place]. Topic: General - Call Back - No Documentation >> Aug 25, 2018  5:20 PM Erick Blinks wrote: Calling in regards to pt's toenail, daughter is requesting call back. Best contact: Lou Cal [phone] VM available

## 2018-08-27 ENCOUNTER — Ambulatory Visit (INDEPENDENT_AMBULATORY_CARE_PROVIDER_SITE_OTHER): Payer: Medicare HMO | Admitting: Internal Medicine

## 2018-08-27 ENCOUNTER — Encounter: Payer: Self-pay | Admitting: Internal Medicine

## 2018-08-27 ENCOUNTER — Other Ambulatory Visit: Payer: Self-pay

## 2018-08-27 DIAGNOSIS — S91209A Unspecified open wound of unspecified toe(s) with damage to nail, initial encounter: Secondary | ICD-10-CM | POA: Diagnosis not present

## 2018-08-27 DIAGNOSIS — G8110 Spastic hemiplegia affecting unspecified side: Secondary | ICD-10-CM

## 2018-08-27 DIAGNOSIS — D126 Benign neoplasm of colon, unspecified: Secondary | ICD-10-CM

## 2018-08-27 DIAGNOSIS — N183 Chronic kidney disease, stage 3 (moderate): Secondary | ICD-10-CM | POA: Diagnosis not present

## 2018-08-27 DIAGNOSIS — F4321 Adjustment disorder with depressed mood: Secondary | ICD-10-CM

## 2018-08-27 DIAGNOSIS — F5105 Insomnia due to other mental disorder: Secondary | ICD-10-CM

## 2018-08-27 DIAGNOSIS — E1121 Type 2 diabetes mellitus with diabetic nephropathy: Secondary | ICD-10-CM | POA: Diagnosis not present

## 2018-08-27 DIAGNOSIS — M810 Age-related osteoporosis without current pathological fracture: Secondary | ICD-10-CM | POA: Diagnosis not present

## 2018-08-27 DIAGNOSIS — E1122 Type 2 diabetes mellitus with diabetic chronic kidney disease: Secondary | ICD-10-CM

## 2018-08-27 DIAGNOSIS — R69 Illness, unspecified: Secondary | ICD-10-CM | POA: Diagnosis not present

## 2018-08-27 NOTE — Progress Notes (Signed)
Telephone Note  This visit type was conducted due to national recommendations for restrictions regarding the COVID-19 pandemic (e.g. social distancing).  This format is felt to be most appropriate for this patient at this time.  All issues noted in this document were discussed and addressed.  No physical exam was performed (except for noted visual exam findings with Video Visits).   I connected with@ on 08/27/18 at 11:30 AM EDT by  telephone and verified that I am speaking with the correct person using two identifiers. Location patient: home Location provider: work or home office Persons participating in the virtual visit: patient, provider  I discussed the limitations, risks, security and privacy concerns of performing an evaluation and management service by telephone and the availability of in person appointments. I also discussed with the patient that there may be a patient responsible charge related to this service. The patient expressed understanding and agreed to proceed.  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  Last seen one year ago   Since her last visit:   Hospitalized in December 2019  For preoperative Left heart cath in preparation for high risk right CEA with history of CVA.  Outpatient eval included  abnormal EKG and myoview positive for reversible and fixed ischemia. Cath shoed.   Moderate to severe 6 vessel disease noted , EF normal .  Ultimately considered to be tToo high risk for CABG and CEA .   Medical management with plavix, statin,  Beta blocker and calcium blocker  . Denies chest pain, shortness of breath.  "the Lord hs healed me"  But she continues to take her medications .  Last cardiology evaluation March 2020 , she was reminded that the blockages are not going to resolve with medication , only hopefully not progress.   Cc:  Left great toenail falling off .  Not  painful,  Not bleeding ,,  No discoloration. No known history of trauma , but walks with a walker since her CVA. No claudication symptoms but does not walk very far, only in the house from room to room.   Mobility:  She lives with her husband but is  alone  For a few hours daily.  Keeps cell phone in pocket. No recent falls.   Does not want a Life alert.  Sons live next door.      Type 2 DM: diet controlled for years.  Last a1c 5.1 in December . Does not check sugars or exercise.    Saw neprology CKD:  Saw Nephrology in July, labs done.  4 month follow up planned   ROS: Patient denies headache, fevers, malaise, unintentional weight loss, skin rash, eye pain, sinus congestion and sinus pain, sore throat, dysphagia,  hemoptysis , cough, dyspnea, wheezing, chest pain, palpitations, orthopnea, edema, abdominal pain, nausea, melena, diarrhea, constipation, flank pain, dysuria, hematuria, urinary  Frequency, nocturia, numbness, tingling, seizures,  Focal weakness, Loss of consciousness,  Tremor, insomnia, depression, anxiety, and suicidal ideation.      Past Medical History:  Diagnosis Date  . Anemia   . Arthritis   . CAD (coronary artery disease)    a. LHC 12/19: m-d left main 75%, oLAD 99%, mLAD 90%, oLCx 60%, pLCx 95%, mLCx 90%, EF 55 to 65%, not a candidate for CABG or PCI, medical management  . Carotid artery occlusion    Right side  . Chronic  kidney disease (CKD), stage III (moderate) ([place])   . Depression   . Diabetes mellitus with complication ([place])   . Diastolic dysfunction    a. TTE 12/19: EF of 55 to 60%, mild LVH, no regional wall motion abnormalities, grade 1 diastolic dysfunction, calcified mitral annulus, trivial pericardial effusion  . Diverticulitis   . Fracture of lateral malleolus of right ankle 03/06/2016   Secondary to accident at home slipped while crossing an uneven threshhold. .   . GERD (gastroesophageal reflux disease)   . HTN (hypertension)   . Hyperlipidemia   .  Stroke ([place]) Sept-Oct 2015  . Urine incontinence     Past Surgical History:  Procedure Laterality Date  . ABDOMINAL HYSTERECTOMY  1973  . CHOLECYSTECTOMY  1990  . COLONOSCOPY WITH PROPOFOL N/A 07/16/2017   Procedure: COLONOSCOPY WITH PROPOFOL;  Surgeon: Lucilla Lame, MD;  Location: [facility] ENDOSCOPY;  Service: Endoscopy;  Laterality: N/A;  . LEFT HEART CATH AND CORONARY ANGIOGRAPHY Left 12/19/2017   Procedure: LEFT HEART CATH AND CORONARY ANGIOGRAPHY;  Surgeon: Minna Merritts, MD;  Location: [facility];  Service: Cardiovascular;  Laterality: Left;  . TONSILLECTOMY      Family History  Problem Relation Age of Onset  . Stroke Mother   . Cancer Sister 42       breast  . Diabetes Brother   . Seizures Sister   . Diabetes Son     SOCIAL HX:  reports that she quit smoking about 4 years ago. She quit after 50.00 years of use. She has never used smokeless tobacco. She reports that she does not drink alcohol or use drugs.   Current Outpatient Medications:  .  amitriptyline (ELAVIL) 10 MG tablet, TAKE 1 TABLET BY MOUTH AT BEDTIME AS NEEDED FOR INSOMNIA, Disp: 90 tablet, Rfl: 1 .  amLODipine (NORVASC) 10 MG tablet, TAKE 1 TABLET BY MOUTH EVERY DAY, Disp: 30 tablet, Rfl: 0 .  aspirin 81 MG tablet, Take 81 mg by mouth daily., Disp: , Rfl:  .  atorvastatin (LIPITOR) 80 MG tablet, Take 1 tablet (80 mg total) by mouth daily at 6 PM., Disp: 30 tablet, Rfl: 3 .  blood glucose meter kit and supplies, Please dispense One Touch meter, E11.22, Disp: 1 each, Rfl: 0 .  calcitRIOL (ROCALTROL) 0.25 MCG capsule, Take 0.25 mcg by mouth daily., Disp: , Rfl:  .  clopidogrel (PLAVIX) 75 MG tablet, Take 1 tablet (75 mg total) by mouth daily., Disp: 30 tablet, Rfl: 11 .  [employer] VITAMIN B12 1000 MCG tablet, TAKE 1 TABLET BY MOUTH EVERY DAY, Disp: 90 tablet, Rfl: 2 .  doxazosin (CARDURA) 2 MG tablet, TAKE 1 TABLET BY MOUTH EVERY DAY, Disp: 30 tablet, Rfl: 0 .  famotidine (PEPCID) 20 MG tablet, TAKE 1 TABLET  BY MOUTH EVERY DAY, Disp: 30 tablet, Rfl: 0 .  fexofenadine (ALLEGRA) 180 MG tablet, TAKE 1 TABLET BY MOUTH EVERY DAY, Disp: 30 tablet, Rfl: 0 .  fluticasone (FLONASE) 50 MCG/ACT nasal spray, Place 2 sprays into both nostrils daily as needed for allergies. , Disp: , Rfl:  .  glucose blood test strip, Use as instructed, one touch to check blood glucose up to 3 times a day. E11.22, Disp: 100 each, Rfl: 12 .  hydrALAZINE (APRESOLINE) 25 MG tablet, TAKE 1 TABLET BY MOUTH THREE TIMES A DAY, Disp: 90 tablet, Rfl: 1 .  Lancets (FREESTYLE) lancets, USE AS DIRECTED ONCE DAILY, Disp: 100 each, Rfl: 0 .  metoprolol tartrate (LOPRESSOR) 25 MG tablet,  TAKE 0.5 TABLETS (12.5 MG TOTAL) BY MOUTH 2 (TWO) TIMES DAILY., Disp: , Rfl:  .  nitroGLYCERIN (NITROSTAT) 0.4 MG SL tablet, Place 1 tablet (0.4 mg total) under the tongue every 5 (five) minutes x 3 doses as needed for chest pain., Disp: 12 tablet, Rfl: 2 .  carvedilol (COREG) 3.125 MG tablet, Take 1 tablet (3.125 mg total) by mouth 2 (two) times daily with a meal. (Patient not taking: Reported on 08/27/2018), Disp: 60 tablet, Rfl: 6 .  losartan (COZAAR) 100 MG tablet, Take 1 tablet (100 mg total) by mouth daily. (Patient not taking: Reported on 08/27/2018), Disp: 90 tablet, Rfl: 0  EXAM:  General impression: alert, cooperative and articulate.  No signs of being in distress   Lungs: speech is fluent sentence length suggests that patient is not short of breath and not punctuated by cough, sneezing or sniffing. Marland Kitchen   Psych: affect normal.  speech is articulate and non pressured .  Denies suicidal thoughts   CV: no obvious cyanosis  MS: right sided weakness, chronic.  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 mellitus ([place])  Type 2 diabetes mellitus with stage 3 chronic kidney disease, without long-term current use of insulin (HCC)  Insomnia secondary to situational depression  Age-related osteoporosis without current pathological fracture - Plan: DG Bone Density  Avulsion of toenail, initial encounter  Spastic hemiparesis affecting dominant side ([place]) She refuses to avail herself of Life Alert despite using a walker and being alone for several hours daily, choosing to rely on her cell phone for emergencies despite the risks and limitations outlined to her today   Tubular adenoma of colon She underwent diagnostic colonoscopy in July 2019 for investigation of new onset IDA.  6 TAs were removed. It is unclear whether GI will repeat her scope in 3 years given her severe CAD and PAD   Diabetic nephropathy ([place])  Patient is followed by nephrology for nephropathy and  CKD.  Patient is tolerating statin therapy for CAD risk reduction , but is not on ACE/ARB  Because of prior intolerance.    Lab Results  Component Value Date   HGBA1C 5.1 12/20/2017   Lab Results  Component Value Date   CHOL 113 12/20/2017   HDL 35 (L) 12/20/2017   LDLCALC 62 12/20/2017   LDLDIRECT 73.0 02/22/2016   TRIG 80 12/20/2017   CHOLHDL 3.2 12/20/2017   Lab Results  Component Value Date   MICROALBUR 211.9 (H) 06/14/2015       Diabetes mellitus with chronic kidney disease ([place]) Disease resolved by last a1c of 5.1 . Patient is followed by nephrology for nephropathy and  CKD.  Patient is tolerating statin therapy for CAD risk reduction , but is not on ACE/ARB  Because of prior intolerance.    Lab Results  Component Value Date   HGBA1C 5.1 12/20/2017   Lab Results  Component Value Date   CHOL 113 12/20/2017   HDL 35 (L) 12/20/2017   LDLCALC 62 12/20/2017   LDLDIRECT 73.0 02/22/2016   TRIG 80 12/20/2017   CHOLHDL 3.2  12/20/2017   Lab Results  Component Value Date   MICROALBUR 211.9 (H) 06/14/2015       Insomnia secondary to situational depression  Managed with  amitriptyline per patient request  Osteoporosis No longer taking alendronate.  Last DEA 2016.  History of ankle fracture secondary to fall in 2018.  Using a walker  Will repeat DEXA and consider Prolia   Toenail avulsion Nontraumatic,  Without pain or bleeding.  Reassurance provided podiatry referral offered but deferred. Marland Kitchen  Keep toenail covered.     I discussed the assessment and treatment plan with the patient. The patient was provided an opportunity to ask questions and all were answered. The patient agreed with the plan and demonstrated an understanding of the instructions.   The patient was advised to call back or seek an in-person evaluation if the symptoms worsen or if the condition fails to improve as anticipated.  A total of 25 minutes of non face to face time was spent with patient more than half of which was spent in counselling about the above mentioned conditions  and coordination of care , and an additional 15 minutes was spent reviewing patient's charts and recent office visit.    Crecencio Mc, MD

## 2018-08-30 ENCOUNTER — Encounter: Payer: Self-pay | Admitting: Internal Medicine

## 2018-08-30 DIAGNOSIS — S91209A Unspecified open wound of unspecified toe(s) with damage to nail, initial encounter: Secondary | ICD-10-CM | POA: Insufficient documentation

## 2018-08-30 NOTE — Assessment & Plan Note (Signed)
She underwent diagnostic colonoscopy in July 2019 for investigation of new onset IDA.  6 TAs were removed. It is unclear whether GI will repeat her scope in 3 years given her severe CAD and PAD

## 2018-08-30 NOTE — Assessment & Plan Note (Signed)
Nontraumatic,  Without pain or bleeding.  Reassurance provided podiatry referral offered but deferred. Marland Kitchen  Keep toenail covered.

## 2018-08-30 NOTE — Assessment & Plan Note (Signed)
Managed with  amitriptyline per patient request

## 2018-08-30 NOTE — Assessment & Plan Note (Signed)
Disease resolved by last a1c of 5.1 . Patient is followed by nephrology for nephropathy and  CKD.  Patient is tolerating statin therapy for CAD risk reduction , but is not on ACE/ARB  Because of prior intolerance.    Lab Results  Component Value Date   HGBA1C 5.1 12/20/2017   Lab Results  Component Value Date   CHOL 113 12/20/2017   HDL 35 (L) 12/20/2017   LDLCALC 62 12/20/2017   LDLDIRECT 73.0 02/22/2016   TRIG 80 12/20/2017   CHOLHDL 3.2 12/20/2017   Lab Results  Component Value Date   MICROALBUR 211.9 (H) 06/14/2015

## 2018-08-30 NOTE — Assessment & Plan Note (Signed)
Patient is followed by nephrology for nephropathy and  CKD.  Patient is tolerating statin therapy for CAD risk reduction , but is not on ACE/ARB  Because of prior intolerance.    Lab Results  Component Value Date   HGBA1C 5.1 12/20/2017   Lab Results  Component Value Date   CHOL 113 12/20/2017   HDL 35 (L) 12/20/2017   LDLCALC 62 12/20/2017   LDLDIRECT 73.0 02/22/2016   TRIG 80 12/20/2017   CHOLHDL 3.2 12/20/2017   Lab Results  Component Value Date   MICROALBUR 211.9 (H) 06/14/2015

## 2018-08-30 NOTE — Assessment & Plan Note (Signed)
No longer taking alendronate.  Last DEA 2016.  History of ankle fracture secondary to fall in 2018.  Using a walker  Will repeat DEXA and consider Prolia

## 2018-08-30 NOTE — Assessment & Plan Note (Signed)
She refuses to avail herself of Life Alert despite using a walker and being alone for several hours daily, choosing to rely on her cell phone for emergencies despite the risks and limitations outlined to her today

## 2018-09-07 ENCOUNTER — Other Ambulatory Visit: Payer: Self-pay | Admitting: Cardiovascular Disease

## 2018-09-08 ENCOUNTER — Other Ambulatory Visit: Payer: Self-pay | Admitting: Internal Medicine

## 2018-09-10 DIAGNOSIS — R69 Illness, unspecified: Secondary | ICD-10-CM | POA: Diagnosis not present

## 2018-09-15 ENCOUNTER — Other Ambulatory Visit: Payer: Self-pay | Admitting: Internal Medicine

## 2018-10-02 ENCOUNTER — Encounter: Payer: Self-pay | Admitting: Internal Medicine

## 2018-10-11 ENCOUNTER — Other Ambulatory Visit: Payer: Self-pay | Admitting: Internal Medicine

## 2018-11-17 ENCOUNTER — Other Ambulatory Visit: Payer: Self-pay | Admitting: Internal Medicine

## 2018-11-18 ENCOUNTER — Ambulatory Visit
Admission: RE | Admit: 2018-11-18 | Discharge: 2018-11-18 | Disposition: A | Discharge status: Home / Self Care | Payer: Medicare HMO | Source: Ambulatory Visit | Attending: Internal Medicine | Admitting: Internal Medicine

## 2018-11-18 DIAGNOSIS — M810 Age-related osteoporosis without current pathological fracture: Secondary | ICD-10-CM

## 2018-11-18 DIAGNOSIS — Z780 Asymptomatic menopausal state: Secondary | ICD-10-CM | POA: Diagnosis not present

## 2018-12-01 ENCOUNTER — Encounter: Payer: Self-pay | Admitting: Internal Medicine

## 2018-12-01 ENCOUNTER — Ambulatory Visit (INDEPENDENT_AMBULATORY_CARE_PROVIDER_SITE_OTHER): Payer: Medicare HMO | Admitting: Internal Medicine

## 2018-12-01 DIAGNOSIS — I10 Essential (primary) hypertension: Secondary | ICD-10-CM | POA: Diagnosis not present

## 2018-12-01 DIAGNOSIS — G8110 Spastic hemiplegia affecting unspecified side: Secondary | ICD-10-CM | POA: Diagnosis not present

## 2018-12-01 DIAGNOSIS — M810 Age-related osteoporosis without current pathological fracture: Secondary | ICD-10-CM

## 2018-12-01 DIAGNOSIS — R001 Bradycardia, unspecified: Secondary | ICD-10-CM

## 2018-12-01 DIAGNOSIS — F0150 Vascular dementia without behavioral disturbance: Secondary | ICD-10-CM

## 2018-12-01 DIAGNOSIS — R69 Illness, unspecified: Secondary | ICD-10-CM | POA: Diagnosis not present

## 2018-12-01 NOTE — Progress Notes (Signed)
Virtual Visit ([place]) converted  to Telephone Visit   This visit type was conducted due to national recommendations for restrictions regarding the COVID-19 pandemic (e.g. social distancing).  This format is felt to be most appropriate for this patient at this time.  All issues noted in this document were discussed and addressed.  No physical exam was performed (except for noted visual exam findings with Video Visits).   I attempted to connect with@ on 12/01/18 at  4:30 PM EST by a video enabled telemedicine application. Interactive audio and video telecommunications were attempted between this provider and patient, however failed, due to patient having technical difficulties .  We continued and completed visit with audio only and verified that I am speaking with the correct person using two identifiers.  Location patient: home Location provider: work or home office Persons participating in the virtual visit: patient, provider and daughter Leana Gamer")  I discussed the limitations, risks, security and privacy concerns of performing an evaluation and management service by telephone and the availability of in person appointments. I also discussed with the patient that there may be a patient responsible charge related to this service. The patient expressed understanding and agreed to proceed.  Reason for visit: follow up on medications,  Recent DEXA scan  HPI:   78 yr old female with severe no noperable CAD , PAD s/p embolic CVA with hemiparesis presents for discussion of management of osteoporosis noted on recent DEXA scan.  [place]  Uses a walker since her stroke,  Did not progress with home PT per daughter Marland Kitchenshe ran the last one off") no recent falls.  Daughter has noticed cognitive decline since March and feels her mother may have dementia .refuses to wear a Life Alert.   REsults of DEXA scan discussed with both patient and daughter.  Patient has Stage 3 CKD  Does not  take calcium or vitamin D supplements.  Manages her own medications ,  Not taking carvedilol or metoprolol for unclear reasons.  Not checking BP/pulse at home.  Denies pain , shortness of breath,  Symptoms of depression,  Poor appetite and insomnia.    ROS: See pertinent positives and negatives per HPI.  Past Medical History:  Diagnosis Date  . Anemia   . Arthritis   . CAD (coronary artery disease)    a. LHC 12/19: m-d left main 75%, oLAD 99%, mLAD 90%, oLCx 60%, pLCx 95%, mLCx 90%, EF 55 to 65%, not a candidate for CABG or PCI, medical management  . Carotid artery occlusion    Right side  . Chronic kidney disease (CKD), stage III (moderate)   . Depression   . Diabetes mellitus with complication ([place])   . Diastolic dysfunction    a. TTE 12/19: EF of 55 to 60%, mild LVH, no regional wall motion abnormalities, grade 1 diastolic dysfunction, calcified mitral annulus, trivial pericardial effusion  . Diverticulitis   . Fracture of lateral malleolus of right ankle 03/06/2016   Secondary to accident at home slipped while crossing an uneven threshhold. .   . GERD (gastroesophageal reflux disease)   . HTN (hypertension)   . Hyperlipidemia   . Stroke ([place]) Sept-Oct 2015  . Urine incontinence     Past Surgical History:  Procedure Laterality Date  . ABDOMINAL HYSTERECTOMY  1973  . CHOLECYSTECTOMY  1990  . COLONOSCOPY WITH PROPOFOL N/A 07/16/2017   Procedure: COLONOSCOPY WITH PROPOFOL;  Surgeon: Lucilla Lame, MD;  [facility] ENDOSCOPY;  Service: Endoscopy;  Laterality: N/A;  . LEFT HEART CATH AND CORONARY ANGIOGRAPHY Left 12/19/2017   Procedure: LEFT HEART CATH AND CORONARY ANGIOGRAPHY;  Surgeon: Minna Merritts, MD;  Location: [place] [facility];  Service: Cardiovascular;  Laterality: Left;  . TONSILLECTOMY      Family History  Problem Relation Age of Onset  . Stroke Mother   . Cancer Sister 20       breast  . Diabetes Brother   . Seizures Sister   . Diabetes Son     SOCIAL  HX:  reports that she quit smoking about 5 years ago. She quit after 50.00 years of use. She has never used smokeless tobacco. She reports that she does not drink alcohol or use drugs.  Current Outpatient Medications:  .  amitriptyline (ELAVIL) 10 MG tablet, TAKE 1 TABLET BY MOUTH EVERY DAY AT BEDTIME AS NEEDED FOR INSOMNIA, Disp: 90 tablet, Rfl: 1 .  amLODipine (NORVASC) 10 MG tablet, TAKE 1 TABLET BY MOUTH EVERY DAY, Disp: 90 tablet, Rfl: 1 .  aspirin 81 MG tablet, Take 81 mg by mouth daily., Disp: , Rfl:  .  atorvastatin (LIPITOR) 80 MG tablet, TAKE 1 TABLET (80 MG TOTAL) BY MOUTH DAILY AT 6 PM., Disp: 90 tablet, Rfl: 3 .  blood glucose meter kit and supplies, Please dispense One Touch meter, E11.22, Disp: 1 each, Rfl: 0 .  calcitRIOL (ROCALTROL) 0.25 MCG capsule, Take 0.25 mcg by mouth daily., Disp: , Rfl:  .  clopidogrel (PLAVIX) 75 MG tablet, Take 1 tablet (75 mg total) by mouth daily., Disp: 30 tablet, Rfl: 11 .  doxazosin (CARDURA) 2 MG tablet, TAKE 1 TABLET BY MOUTH EVERY DAY, Disp: 90 tablet, Rfl: 1 .  famotidine (PEPCID) 20 MG tablet, TAKE 1 TABLET BY MOUTH EVERY DAY, Disp: 90 tablet, Rfl: 1 .  fluticasone (FLONASE) 50 MCG/ACT nasal spray, Place 2 sprays into both nostrils daily as needed for allergies. , Disp: , Rfl:  .  glucose blood test strip, Use as instructed, one touch to check blood glucose up to 3 times a day. E11.22, Disp: 100 each, Rfl: 12 .  hydrALAZINE (APRESOLINE) 25 MG tablet, TAKE 1 TABLET BY MOUTH THREE TIMES A DAY, Disp: 90 tablet, Rfl: 1 .  Lancets (FREESTYLE) lancets, USE AS DIRECTED ONCE DAILY, Disp: 100 each, Rfl: 0 .  losartan (COZAAR) 100 MG tablet, Take 1 tablet (100 mg total) by mouth daily., Disp: 90 tablet, Rfl: 0 .  nitroGLYCERIN (NITROSTAT) 0.4 MG SL tablet, Place 1 tablet (0.4 mg total) under the tongue every 5 (five) minutes x 3 doses as needed for chest pain., Disp: 12 tablet, Rfl: 2 .  sodium bicarbonate 650 MG tablet, Take 650 mg by mouth 2 (two) times  daily., Disp: , Rfl:  .  carvedilol (COREG) 3.125 MG tablet, Take 1 tablet (3.125 mg total) by mouth 2 (two) times daily with a meal. (Patient not taking: Reported on 08/27/2018), Disp: 60 tablet, Rfl: 6 .  [employer] VITAMIN B12 1000 MCG tablet, TAKE 1 TABLET BY MOUTH EVERY DAY (Patient not taking: Reported on 12/01/2018), Disp: 90 tablet, Rfl: 2 .  fexofenadine (ALLEGRA) 180 MG tablet, TAKE 1 TABLET BY MOUTH EVERY DAY (Patient not taking: Reported on 12/01/2018), Disp: 90 tablet, Rfl: 1 .  metoprolol tartrate (LOPRESSOR) 25 MG tablet, TAKE 0.5 TABLETS (12.5 MG TOTAL) BY MOUTH 2 (TWO) TIMES DAILY., Disp: , Rfl:   EXAM:   General impression: alert, cooperative and articulate.  No signs of being in distress  Lungs: speech is fluent sentence length suggests that patient is not short of breath and not punctuated by cough, sneezing or sniffing. Marland Kitchen   Psych: affect normal.  speech is articulate and non pressured .  Denies suicidal thoughts   ASSESSMENT AND PLAN:  Discussed the following assessment and plan:  Spastic hemiparesis affecting dominant side (HCC)  Essential hypertension  Sinus bradycardia  Age-related osteoporosis without current pathological fracture  Vascular dementia without behavioral disturbance (HCC)  Spastic hemiparesis affecting dominant side ([place]) She refuses to avail herself of Life Alert despite using a walker and being alone for several hours daily, choosing to rely on her cell phone for emergencies despite the risks and limitations outlined to her today   Essential hypertension Daughter advised to check VS and submit for review .  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 Patient is followed by nephrology for nephropathy and  CKD.  Patient is tolerating statin therapy for CAD risk reduction , but is not on ACE/ARB  Because of prior intolerance.    Lab Results  Component  Value Date   HGBA1C 5.1 12/20/2017   Lab Results  Component Value Date   CHOL 113 12/20/2017   HDL 35 (L) 12/20/2017   LDLCALC 62 12/20/2017   LDLDIRECT 73.0 02/22/2016   TRIG 80 12/20/2017   CHOLHDL 3.2 12/20/2017   Lab Results  Component Value Date   MICROALBUR 211.9 (H) 06/14/2015       Sinus bradycardia Likely secondary to beta blocker. Which was apparently stopped by cardiology in march.  Repeat assessment needed   Osteoporosis By DEXA with no prior fracgure.  T score -3.5 at femur. She is a t high risk for fall given prior stroke, use of walker,  And lack of 24 hour supervision  . She has CKD and PAD/CAD making oral med choices (SERMS and bisphosphonates contraindicated).  She hwas available transportation so would be a good candidate for Evenity or Prolia.  Prior authorization in progress.  Advised to start calcium and vitamin d supplements   Vascular dementia ([place]) Suspected based on daughter's observations.  Goals of care discussed and lack of available effective medications discussed. . Needs MMSE with next OV     I discussed the assessment and treatment plan with the patient. The patient was provided an opportunity to ask questions and all were answered. The patient agreed with the plan and demonstrated an understanding of the instructions.   The patient was advised to call back or seek an in-person evaluation if the symptoms worsen or if the condition fails to improve as anticipated.  I provided  25 minutes of non-face-to-face time during this encounter reviewing patient's current problems and post surgeries.  Providing counseling on the above mentioned problems , and coordination  of care .   Crecencio Mc, MD

## 2018-12-02 ENCOUNTER — Telehealth: Payer: Self-pay | Admitting: *Deleted

## 2018-12-02 DIAGNOSIS — F0150 Vascular dementia without behavioral disturbance: Secondary | ICD-10-CM | POA: Insufficient documentation

## 2018-12-02 MED ORDER — CARVEDILOL 3.125 MG PO TABS: 3.1250 mg | ORAL_TABLET | Freq: Two times a day (BID) | ORAL | 6 refills | Status: AC

## 2018-12-02 NOTE — Telephone Encounter (Signed)
Please tell her that ms Eick needs to resume carvedilol twice daily.  rx sent to pharmacy

## 2018-12-02 NOTE — Assessment & Plan Note (Signed)
She refuses to avail herself of Life Alert despite using a walker and being alone for several hours daily, choosing to rely on her cell phone for emergencies despite the risks and limitations outlined to her today

## 2018-12-02 NOTE — Assessment & Plan Note (Signed)
Daughter advised to check VS and submit for review .  If pulse remains < 60,  Will NOT resume beta blocker

## 2018-12-02 NOTE — Telephone Encounter (Signed)
Patient daughter advised and will pick up medication.

## 2018-12-02 NOTE — Assessment & Plan Note (Signed)
Disease resolved by last a1c of 5.1 . Patient is followed by nephrology for nephropathy and  CKD.  Patient is tolerating statin therapy for CAD risk reduction , but is not on ACE/ARB  Because of prior intolerance.    Lab Results  Component Value Date   HGBA1C 5.1 12/20/2017   Lab Results  Component Value Date   CHOL 113 12/20/2017   HDL 35 (L) 12/20/2017   LDLCALC 62 12/20/2017   LDLDIRECT 73.0 02/22/2016   TRIG 80 12/20/2017   CHOLHDL 3.2 12/20/2017   Lab Results  Component Value Date   MICROALBUR 211.9 (H) 06/14/2015

## 2018-12-02 NOTE — Telephone Encounter (Signed)
Copied from [place] [phone]. Topic: Quick Communication - See Telephone Encounter >> Dec 01, 2018  5:18 PM Loma Boston wrote: CRM for notification. See Telephone encounter for: 12/01/18. Pt's daughter leaving message of BP and message  130/57 pulse 84 at 5:00 pm  sitting position left arm

## 2018-12-02 NOTE — Assessment & Plan Note (Signed)
Suspected based on daughter's observations.  Goals of care discussed and lack of available effective medications discussed. . Needs MMSE with next OV

## 2018-12-02 NOTE — Assessment & Plan Note (Signed)
Likely secondary to beta blocker. Which was apparently stopped by cardiology in march.  Repeat assessment needed

## 2018-12-02 NOTE — Assessment & Plan Note (Addendum)
By DEXA with no prior fracgure.  T score -3.5 at femur. She is a t high risk for fall given prior stroke, use of walker,  And lack of 24 hour supervision  . She has CKD and PAD/CAD making oral med choices (SERMS and bisphosphonates contraindicated).  She hwas available transportation so would be a good candidate for Evenity or Prolia.  Prior authorization in progress.  Advised to start calcium and vitamin d supplements

## 2018-12-08 ENCOUNTER — Telehealth: Payer: Self-pay | Admitting: *Deleted

## 2018-12-08 NOTE — Telephone Encounter (Signed)
Submitted authorization for [employer]  Patient ID  [id number]

## 2018-12-08 NOTE — Telephone Encounter (Signed)
-----   Message from Nanci Pina, RN sent at 12/05/2018  4:07 PM EST -----  ----- Message ----- From: Crecencio Mc, MD Sent: 12/02/2018  10:42 AM EST To: Nanci Pina, RN  eeds PA for Prolia or EVenity ( T score -3.5 hip)

## 2018-12-15 ENCOUNTER — Telehealth: Payer: Self-pay

## 2018-12-15 DIAGNOSIS — L603 Nail dystrophy: Secondary | ICD-10-CM

## 2018-12-15 DIAGNOSIS — I739 Peripheral vascular disease, unspecified: Secondary | ICD-10-CM

## 2018-12-15 DIAGNOSIS — E1121 Type 2 diabetes mellitus with diabetic nephropathy: Secondary | ICD-10-CM

## 2018-12-15 DIAGNOSIS — S91209S Unspecified open wound of unspecified toe(s) with damage to nail, sequela: Secondary | ICD-10-CM

## 2018-12-15 NOTE — Telephone Encounter (Signed)
Copied from [place] [id number]. Topic: Referral - Request for Referral >> Dec 15, 2018  3:53 PM Erick Blinks wrote: Has patient seen PCP for this complaint? Yes.   *If NO, is insurance requiring patient see PCP for this issue before PCP can refer them? Referral for which specialty: Podiatry  Preferred provider/office: highest recommended within network and area Reason for referral: Toenail complications

## 2018-12-15 NOTE — Telephone Encounter (Signed)
Spoke with pt's daughter and she is wanting to know if you have heard anything from the pt's insurance in regards to the [place] injection?

## 2018-12-15 NOTE — Telephone Encounter (Signed)
Great toe on left foot, right foot great toe and second toe nails all look like they are falling off. Pt's daughter would like to pt to see podiatry to have them removed, she is concerned because the pt is diabetic.

## 2018-12-15 NOTE — Addendum Note (Signed)
Addended by: Crecencio Mc on: 12/15/2018 05:11 PM   Modules accepted: Orders

## 2018-12-15 NOTE — Telephone Encounter (Signed)
Podiatry Referral is in process as requested

## 2018-12-15 NOTE — Telephone Encounter (Signed)
Pt's daughter is requesting a call back, has questions please advise

## 2018-12-16 NOTE — Telephone Encounter (Signed)
Left message letting pt's daughter know that the referral for podiatry has been placed.

## 2018-12-16 NOTE — Telephone Encounter (Addendum)
Per DPR left detailed message that Evenity in progress for approval takes 7 - 10 business days submitted on 12/08/18.

## 2018-12-22 ENCOUNTER — Other Ambulatory Visit: Payer: Self-pay | Admitting: *Deleted

## 2018-12-22 MED ORDER — CLOPIDOGREL BISULFATE 75 MG PO TABS: 75.0000 mg | ORAL_TABLET | Freq: Every day | ORAL | 3 refills | Status: DC

## 2018-12-23 DIAGNOSIS — E872 Acidosis: Secondary | ICD-10-CM | POA: Diagnosis not present

## 2018-12-23 DIAGNOSIS — N2581 Secondary hyperparathyroidism of renal origin: Secondary | ICD-10-CM | POA: Diagnosis not present

## 2018-12-23 DIAGNOSIS — I10 Essential (primary) hypertension: Secondary | ICD-10-CM | POA: Diagnosis not present

## 2018-12-23 DIAGNOSIS — N1832 Chronic kidney disease, stage 3b: Secondary | ICD-10-CM | POA: Diagnosis not present

## 2018-12-23 DIAGNOSIS — R809 Proteinuria, unspecified: Secondary | ICD-10-CM | POA: Diagnosis not present

## 2018-12-23 DIAGNOSIS — D631 Anemia in chronic kidney disease: Secondary | ICD-10-CM | POA: Diagnosis not present

## 2019-01-08 ENCOUNTER — Ambulatory Visit: Payer: Medicare HMO | Admitting: Podiatry

## 2019-01-08 ENCOUNTER — Other Ambulatory Visit: Payer: Self-pay

## 2019-01-08 ENCOUNTER — Encounter: Payer: Self-pay | Admitting: Podiatry

## 2019-01-08 DIAGNOSIS — E1159 Type 2 diabetes mellitus with other circulatory complications: Secondary | ICD-10-CM

## 2019-01-08 DIAGNOSIS — M79676 Pain in unspecified toe(s): Secondary | ICD-10-CM | POA: Diagnosis not present

## 2019-01-08 DIAGNOSIS — B351 Tinea unguium: Secondary | ICD-10-CM

## 2019-01-08 DIAGNOSIS — E1142 Type 2 diabetes mellitus with diabetic polyneuropathy: Secondary | ICD-10-CM

## 2019-01-08 DIAGNOSIS — M79674 Pain in right toe(s): Secondary | ICD-10-CM | POA: Diagnosis not present

## 2019-01-08 DIAGNOSIS — M79675 Pain in left toe(s): Secondary | ICD-10-CM

## 2019-01-08 DIAGNOSIS — L603 Nail dystrophy: Secondary | ICD-10-CM

## 2019-01-08 DIAGNOSIS — E1140 Type 2 diabetes mellitus with diabetic neuropathy, unspecified: Secondary | ICD-10-CM | POA: Insufficient documentation

## 2019-01-08 NOTE — Progress Notes (Signed)
This patient presents the office concerned about her left big toenail.  She says her toenail has lifted at the cuticle but she is not having any redness swelling or pain.  She has had a previous history of a toenail avulsion.  She presents the office today with her husband for an evaluation and treatment.  She has a history of diabetic neuropathy and PAD.  She presents for preventative foot care services and evaluation of her left big toenail.  Vascular  Dorsalis pedis and posterior tibial pulses are not  palpable  B/L.  Capillary return  WNL.  Cold feet  B/L.Marland Kitchen  Skin turgor  WNL  Sensorium  Senn Weinstein monofilament wire  WNL. Normal tactile sensation.  Nail Exam  Patient has normal nails with no evidence of bacterial or fungal infection.Patient has thick disfigured discolored hallux toenail left foot.  Nail has lifted at proximal nail ford with no evidence of infection or drainage.  Orthopedic  Exam  Muscle tone and muscle strength  WNL.  No limitations of motion feet  B/L.  No crepitus or joint effusion noted.  Foot type is unremarkable and digits show no abnormalities.  Bony prominences are unremarkable.  Skin  No open lesions.  Normal skin texture and turgor.  Dystropic hallux nail left foot.  IE.  Debride nail.  Discussed condition with patient.  Told her that I prefer to treat her conservatively as opposed to surgically removing the nonpainful thickened nail.  Told her to return to the office in 3 months for continued evaluation and treatment.   Gardiner Barefoot DPM

## 2019-01-26 DIAGNOSIS — H903 Sensorineural hearing loss, bilateral: Secondary | ICD-10-CM | POA: Diagnosis not present

## 2019-01-26 DIAGNOSIS — H6123 Impacted cerumen, bilateral: Secondary | ICD-10-CM | POA: Diagnosis not present

## 2019-02-10 NOTE — Telephone Encounter (Signed)
Patient's daughter was wondering if her mother has been approved by insurance for injection. Please call her daughter, Rosann Auerbach at [phone] after 2:30 pm

## 2019-02-13 NOTE — Telephone Encounter (Signed)
Candace Monroe Key: [id number] - PA Case ID: [id number]  Candace Monroe has been authorized by [employer] awaiting appeal from Insurance filed 02/13/19 on Cover My Meds,  Verbal denial received on first PA.

## 2019-02-13 NOTE — Telephone Encounter (Signed)
Appeal  approved for Evenitiy patient scheduled for first Evenity on 03/10/19.

## 2019-02-24 DIAGNOSIS — J309 Allergic rhinitis, unspecified: Secondary | ICD-10-CM | POA: Diagnosis not present

## 2019-02-24 DIAGNOSIS — E785 Hyperlipidemia, unspecified: Secondary | ICD-10-CM | POA: Diagnosis not present

## 2019-02-24 DIAGNOSIS — R69 Illness, unspecified: Secondary | ICD-10-CM | POA: Diagnosis not present

## 2019-02-24 DIAGNOSIS — M810 Age-related osteoporosis without current pathological fracture: Secondary | ICD-10-CM | POA: Diagnosis not present

## 2019-02-24 DIAGNOSIS — I129 Hypertensive chronic kidney disease with stage 1 through stage 4 chronic kidney disease, or unspecified chronic kidney disease: Secondary | ICD-10-CM | POA: Diagnosis not present

## 2019-02-24 DIAGNOSIS — I4891 Unspecified atrial fibrillation: Secondary | ICD-10-CM | POA: Diagnosis not present

## 2019-02-24 DIAGNOSIS — M1990 Unspecified osteoarthritis, unspecified site: Secondary | ICD-10-CM | POA: Diagnosis not present

## 2019-02-24 DIAGNOSIS — E1151 Type 2 diabetes mellitus with diabetic peripheral angiopathy without gangrene: Secondary | ICD-10-CM | POA: Diagnosis not present

## 2019-02-24 DIAGNOSIS — K219 Gastro-esophageal reflux disease without esophagitis: Secondary | ICD-10-CM | POA: Diagnosis not present

## 2019-02-24 DIAGNOSIS — E1122 Type 2 diabetes mellitus with diabetic chronic kidney disease: Secondary | ICD-10-CM | POA: Diagnosis not present

## 2019-02-24 DIAGNOSIS — Z008 Encounter for other general examination: Secondary | ICD-10-CM | POA: Diagnosis not present

## 2019-03-01 ENCOUNTER — Other Ambulatory Visit: Payer: Self-pay | Admitting: Oncology

## 2019-03-03 ENCOUNTER — Other Ambulatory Visit: Payer: Self-pay | Admitting: Internal Medicine

## 2019-03-10 ENCOUNTER — Other Ambulatory Visit: Payer: Self-pay

## 2019-03-10 ENCOUNTER — Ambulatory Visit (INDEPENDENT_AMBULATORY_CARE_PROVIDER_SITE_OTHER): Payer: Medicare HMO

## 2019-03-10 DIAGNOSIS — M810 Age-related osteoporosis without current pathological fracture: Secondary | ICD-10-CM | POA: Diagnosis not present

## 2019-03-10 MED ORDER — ROMOSOZUMAB-AQQG 105 MG/1.17ML ~~LOC~~ SOSY
210.0000 mg | PREFILLED_SYRINGE | Freq: Once | SUBCUTANEOUS | Status: AC
  Administered 2019-03-10: 210 mg via SUBCUTANEOUS

## 2019-03-10 NOTE — Progress Notes (Signed)
Patient came intoday for Evenity injection in both right & left arm SUBQ. Patient tolerated well.

## 2019-03-19 ENCOUNTER — Other Ambulatory Visit: Payer: Self-pay | Admitting: Cardiovascular Disease

## 2019-03-29 NOTE — Progress Notes (Deleted)
NO SHOW

## 2019-03-30 ENCOUNTER — Ambulatory Visit: Payer: Medicare HMO | Admitting: Cardiovascular Disease

## 2019-03-31 ENCOUNTER — Encounter: Payer: Self-pay | Admitting: Cardiovascular Disease

## 2019-04-07 ENCOUNTER — Other Ambulatory Visit: Payer: Self-pay | Admitting: Internal Medicine

## 2019-04-08 ENCOUNTER — Ambulatory Visit: Payer: Medicare HMO

## 2019-04-09 ENCOUNTER — Ambulatory Visit: Payer: Medicare HMO | Admitting: Podiatry

## 2019-04-09 ENCOUNTER — Encounter: Payer: Self-pay | Admitting: Podiatry

## 2019-04-09 ENCOUNTER — Other Ambulatory Visit: Payer: Self-pay

## 2019-04-09 VITALS — Temp 97.9°F

## 2019-04-09 DIAGNOSIS — N183 Chronic kidney disease, stage 3 unspecified: Secondary | ICD-10-CM

## 2019-04-09 DIAGNOSIS — L603 Nail dystrophy: Secondary | ICD-10-CM

## 2019-04-09 DIAGNOSIS — M79674 Pain in right toe(s): Secondary | ICD-10-CM

## 2019-04-09 DIAGNOSIS — I739 Peripheral vascular disease, unspecified: Secondary | ICD-10-CM

## 2019-04-09 DIAGNOSIS — E1142 Type 2 diabetes mellitus with diabetic polyneuropathy: Secondary | ICD-10-CM | POA: Diagnosis not present

## 2019-04-09 DIAGNOSIS — B351 Tinea unguium: Secondary | ICD-10-CM

## 2019-04-09 DIAGNOSIS — M79675 Pain in left toe(s): Secondary | ICD-10-CM

## 2019-04-09 NOTE — Progress Notes (Signed)
This patient returns to my office for at risk foot care.  This patient requires this care by a professional since this patient will be at risk due to having diabetes PAD, CKD .  She is accompanied by female caregiver. She says the left big toenail has lifted at its base.  No pain swelling infection or drainage noted.  This patient is unable to cut nails herself since the patient cannot reach her nails.These nails are painful walking and wearing shoes.  This patient presents for at risk foot care today.  General Appearance  Alert, conversant and in no acute stress.  Vascular  Dorsalis pedis and posterior tibial  pulses are palpable  bilaterally.  Capillary return is within normal limits  bilaterally. Temperature is within normal limits  bilaterally.  Neurologic  Senn-Weinstein monofilament wire test within normal limits  bilaterally. Muscle power within normal limits bilaterally.  Nails Thick disfigured discolored nails with subungual debris  from hallux to fifth toes bilaterally. No evidence of bacterial infection or drainage bilaterally.  Orthopedic  No limitations of motion  feet .  No crepitus or effusions noted.  No bony pathology or digital deformities noted.  Skin  normotropic skin with no porokeratosis noted bilaterally.  No signs of infections or ulcers noted.     Onychomycosis  Pain in right toes  Pain in left toes  Consent was obtained for treatment procedures.   Mechanical debridement of nails 1-5  bilaterally performed with a nail nipper.  Filed with dremel without incident.  Due to her PAD and vascular dementia this patient was treated conservatively.   Return office visit   3 months.                  Told patient to return for periodic foot care and evaluation due to potential at risk complications.   Gardiner Barefoot DPM

## 2019-04-21 ENCOUNTER — Ambulatory Visit (INDEPENDENT_AMBULATORY_CARE_PROVIDER_SITE_OTHER): Payer: Medicare HMO

## 2019-04-21 ENCOUNTER — Other Ambulatory Visit: Payer: Self-pay

## 2019-04-21 DIAGNOSIS — M810 Age-related osteoporosis without current pathological fracture: Secondary | ICD-10-CM | POA: Diagnosis not present

## 2019-04-21 MED ORDER — ROMOSOZUMAB-AQQG 105 MG/1.17ML ~~LOC~~ SOSY
210.0000 mg | PREFILLED_SYRINGE | Freq: Once | SUBCUTANEOUS | Status: AC
  Administered 2019-04-21: 210 mg via SUBCUTANEOUS

## 2019-04-21 NOTE — Progress Notes (Signed)
Patient came intoday for Evenity injection in both right & left arm SUBQ. Patient tolerated well.

## 2019-04-23 ENCOUNTER — Other Ambulatory Visit: Payer: Self-pay | Admitting: Internal Medicine

## 2019-04-27 NOTE — Progress Notes (Signed)
Date:  04/28/2019   ID:  Candace Monroe, DOB January 13, 1940, MRN [id number]  Patient Location:  [place]  [id number]   Provider location:   [facility], [place] office  PCP:  Crecencio Mc, MD  Cardiologist:  Ida Rogue, MD   Chief Complaint  Patient presents with  . other    12 month follow up. Meds reviewed by the pt.'s med list. "doing well."     History of Present Illness:    Candace Monroe is a 79 y.o. female  past medical history of Diabetes CKD CVA, residual right-sided hemiparesis, 10/2013  former smoker, 50 yrs, COPD, quit 5 yrs ago (since the CVA) hyperlipidemia  Carotid stenosis on the right normocytic anemia/anemia of chronic kidney disease. Severe carotid stenosis Moderate to severe left main, critical ostial LAD and mid LAD disease Severe mid circumflex, moderate ostial circumflex  CAD, severe native disease Evaluated at [facility] 2019 too high risk for surgical intervention.   No chest pain, no SOB Balance poor  Weight down 26 pounds in <2 years  Cath 12/2017 Moderate to severe left main, critical ostial LAD and mid LAD disease Severe mid circumflex, moderate ostial circumflex  Carotid u/s 12/2017 Right Carotid: Velocities in the right ICA are consistent with a 40-59% stenosis.  Left Carotid: Velocities in the left ICA are consistent with a 40-59%  stenosis.   EKG personally reviewed by myself on todays visit NSR rate 66 bpm old anterior MI  Prior CV studies:   The following studies were reviewed today:  Echocardiogram - Left ventricle: The cavity size was normal. Wall thickness was increased in a pattern of mild LVH. Systolic function was normal. The estimated ejection fraction was in the range of 55% to 60%.    Past Medical History:  Diagnosis Date  . Anemia   . Arthritis   . CAD (coronary artery disease)    a. LHC 12/19: m-d left main 75%, oLAD 99%, mLAD 90%, oLCx 60%, pLCx 95%, mLCx 90%,  EF 55 to 65%, not a candidate for CABG or PCI, medical management  . Carotid artery occlusion    Right side  . Chronic kidney disease (CKD), stage III (moderate)   . Depression   . Diabetes mellitus with complication ([place])   . Diastolic dysfunction    a. TTE 12/19: EF of 55 to 60%, mild LVH, no regional wall motion abnormalities, grade 1 diastolic dysfunction, calcified mitral annulus, trivial pericardial effusion  . Diverticulitis   . Fracture of lateral malleolus of right ankle 03/06/2016   Secondary to accident at home slipped while crossing an uneven threshhold. .   . GERD (gastroesophageal reflux disease)   . HTN (hypertension)   . Hyperlipidemia   . Stroke ([place]) Sept-Oct 2015  . Urine incontinence    Past Surgical History:  Procedure Laterality Date  . ABDOMINAL HYSTERECTOMY  1973  . CHOLECYSTECTOMY  1990  . COLONOSCOPY WITH PROPOFOL N/A 07/16/2017   Procedure: COLONOSCOPY WITH PROPOFOL;  Surgeon: Lucilla Lame, MD;  Location: [facility] ENDOSCOPY;  Service: Endoscopy;  Laterality: N/A;  . LEFT HEART CATH AND CORONARY ANGIOGRAPHY Left 12/19/2017   Procedure: LEFT HEART CATH AND CORONARY ANGIOGRAPHY;  Surgeon: Minna Merritts, MD;  Location: [place] CV LAB;  Service: Cardiovascular;  Laterality: Left;  . TONSILLECTOMY       Current Meds  Medication Sig  . amitriptyline (ELAVIL) 10 MG tablet TAKE 1 TABLET BY MOUTH AT BEDTIME  AS NEEDED FOR INSOMNIA  . amLODipine (NORVASC) 10 MG tablet TAKE 1 TABLET BY MOUTH EVERY DAY  . aspirin 81 MG tablet Take 81 mg by mouth daily.  Marland Kitchen atorvastatin (LIPITOR) 80 MG tablet TAKE 1 TABLET (80 MG TOTAL) BY MOUTH DAILY AT 6 PM.  . blood glucose meter kit and supplies Please dispense One Touch meter, E11.22  . calcitRIOL (ROCALTROL) 0.25 MCG capsule TAKE 1 CAPSULE BY MOUTH EVERY DAY  . carvedilol (COREG) 3.125 MG tablet Take 1 tablet (3.125 mg total) by mouth 2 (two) times daily with a meal.  . clopidogrel (PLAVIX) 75 MG tablet TAKE 1 TABLET BY MOUTH  EVERY DAY  . CVS VITAMIN B12 1000 MCG tablet TAKE 1 TABLET BY MOUTH EVERY DAY  . doxazosin (CARDURA) 2 MG tablet TAKE 1 TABLET BY MOUTH EVERY DAY  . ergocalciferol (VITAMIN D2) 1.25 MG (50000 UT) capsule ergocalciferol (vitamin D2) 1,250 mcg (50,000 unit) capsule  TK 1 C PO ONCE A WEEK  . famotidine (PEPCID) 20 MG tablet TAKE 1 TABLET BY MOUTH EVERY DAY  . fexofenadine (ALLEGRA) 180 MG tablet TAKE 1 TABLET BY MOUTH EVERY DAY  . fluticasone (FLONASE) 50 MCG/ACT nasal spray Place 2 sprays into both nostrils daily as needed for allergies.   Marland Kitchen glucose blood test strip Use as instructed, one touch to check blood glucose up to 3 times a day. E11.22  . hydrALAZINE (APRESOLINE) 50 MG tablet Take 50 mg by mouth 3 (three) times daily.  . Lancets (FREESTYLE) lancets USE AS DIRECTED ONCE DAILY  . losartan (COZAAR) 100 MG tablet Take 1 tablet (100 mg total) by mouth daily.  . metoprolol tartrate (LOPRESSOR) 25 MG tablet TAKE 0.5 TABLETS (12.5 MG TOTAL) BY MOUTH 2 (TWO) TIMES DAILY.  . nitroGLYCERIN (NITROSTAT) 0.4 MG SL tablet Place 1 tablet (0.4 mg total) under the tongue every 5 (five) minutes x 3 doses as needed for chest pain.  . sodium bicarbonate 650 MG tablet Take 650 mg by mouth 2 (two) times daily.     Allergies:   Ace inhibitors, Augmentin [amoxicillin-pot clavulanate], and Sulfa antibiotics   Social History   Tobacco Use  . Smoking status: Former Smoker    Years: 50.00    Quit date: 09/01/2013    Years since quitting: 5.6  . Smokeless tobacco: Never Used  Substance Use Topics  . Alcohol use: No    Alcohol/week: 0.0 standard drinks  . Drug use: No     Family Hx: The patient's family history includes Cancer (age of onset: 70) in her sister; Diabetes in her brother and son; Seizures in her sister; Stroke in her mother.  ROS:   Please see the history of present illness.    Review of Systems  Constitutional: Positive for weight loss.  Respiratory: Negative.   Cardiovascular: Negative.    Gastrointestinal: Negative.   Musculoskeletal: Positive for joint pain.       Gait instability  Neurological: Negative.   Psychiatric/Behavioral: Negative.   All other systems reviewed and are negative.    Labs/Other Tests and Data Reviewed:    Recent Labs: No results found for requested labs within last 8760 hours.   Recent Lipid Panel Lab Results  Component Value Date/Time   CHOL 113 12/20/2017 03:34 AM   CHOL 203 (H) 09/30/2013 04:34 AM   TRIG 80 12/20/2017 03:34 AM   TRIG 189 09/30/2013 04:34 AM   HDL 35 (L) 12/20/2017 03:34 AM   HDL 32 (L) 09/30/2013 04:34 AM  CHOLHDL 3.2 12/20/2017 03:34 AM   LDLCALC 62 12/20/2017 03:34 AM   LDLCALC 133 (H) 09/30/2013 04:34 AM   LDLDIRECT 73.0 02/22/2016 10:15 AM    Wt Readings from Last 3 Encounters:  04/28/19 99 lb 8 oz (45.1 kg)  12/01/18 126 lb (57.2 kg)  08/27/18 126 lb (57.2 kg)     Exam:    BP (!) 122/44 (BP Location: Left Arm, Patient Position: Sitting, Cuff Size: Normal)   Pulse 66   Ht 4' 10"  (1.473 m)   Wt 99 lb 8 oz (45.1 kg)   SpO2 98%   BMI 20.80 kg/m  Constitutional:  oriented to person, place, and time. No distress.  Thin HENT:  Head: Grossly normal Eyes:  no discharge. No scleral icterus.  Neck: No JVD, no carotid bruits  Cardiovascular: Regular rate and rhythm, no murmurs appreciated Pulmonary/Chest: Clear to auscultation bilaterally, no wheezes or rails Abdominal: Soft.  no distension.  no tenderness.  Musculoskeletal: Normal range of motion Neurological:  normal muscle tone. Coordination normal. No atrophy Skin: Skin warm and dry Psychiatric: normal affect, pleasant   ASSESSMENT & PLAN:     Coronary artery disease of native artery of native heart with stable angina pectoris (HCC) Severe diffuse disease, medical management  Evaluated at [facility], high risk for bypass surgery denies having any anginal symptoms Will continue aggressive medical management  PAD (peripheral artery disease) (HCC)  Moderate bilateral disease Non-smoker, cholesterol at goal Prior stroke  Carotid artery stenosis with cerebral infarction [facility]) Declined surgery given her age and comorbidities Aggressive medical management  Weight loss 26 pound weight loss in the past year and a half Recommend she increase her calorie intake  Essential hypertension Blood pressure is well controlled on today's visit. No changes made to the medications.  Hyperlipidemia LDL goal <70 Continue statin Numbers at goal   Disposition: Follow-up in 6 months   Signed, Ida Rogue, MD  04/28/2019 4:58 PM    [place] Office [address], [place], [place] [address]

## 2019-04-28 ENCOUNTER — Ambulatory Visit (INDEPENDENT_AMBULATORY_CARE_PROVIDER_SITE_OTHER): Payer: Medicare HMO | Admitting: Cardiovascular Disease

## 2019-04-28 ENCOUNTER — Other Ambulatory Visit: Payer: Self-pay

## 2019-04-28 ENCOUNTER — Encounter: Payer: Self-pay | Admitting: Cardiovascular Disease

## 2019-04-28 VITALS — BP 122/44 | HR 66 | Ht <= 58 in | Wt 99.5 lb

## 2019-04-28 DIAGNOSIS — I739 Peripheral vascular disease, unspecified: Secondary | ICD-10-CM

## 2019-04-28 DIAGNOSIS — I25118 Atherosclerotic heart disease of native coronary artery with other forms of angina pectoris: Secondary | ICD-10-CM

## 2019-04-28 DIAGNOSIS — I63239 Cerebral infarction due to unspecified occlusion or stenosis of unspecified carotid arteries: Secondary | ICD-10-CM

## 2019-04-28 DIAGNOSIS — I10 Essential (primary) hypertension: Secondary | ICD-10-CM

## 2019-04-28 DIAGNOSIS — N183 Chronic kidney disease, stage 3 unspecified: Secondary | ICD-10-CM

## 2019-04-28 DIAGNOSIS — E785 Hyperlipidemia, unspecified: Secondary | ICD-10-CM

## 2019-04-28 MED ORDER — NITROGLYCERIN 0.4 MG SL SUBL: 0.4000 mg | SUBLINGUAL_TABLET | SUBLINGUAL | 2 refills | Status: AC | PRN

## 2019-04-28 NOTE — Patient Instructions (Signed)

## 2019-05-21 DIAGNOSIS — E872 Acidosis: Secondary | ICD-10-CM | POA: Diagnosis not present

## 2019-05-21 DIAGNOSIS — N184 Chronic kidney disease, stage 4 (severe): Secondary | ICD-10-CM | POA: Diagnosis not present

## 2019-05-21 DIAGNOSIS — D631 Anemia in chronic kidney disease: Secondary | ICD-10-CM | POA: Diagnosis not present

## 2019-05-21 DIAGNOSIS — R809 Proteinuria, unspecified: Secondary | ICD-10-CM | POA: Diagnosis not present

## 2019-05-21 DIAGNOSIS — I10 Essential (primary) hypertension: Secondary | ICD-10-CM | POA: Diagnosis not present

## 2019-05-21 DIAGNOSIS — N2581 Secondary hyperparathyroidism of renal origin: Secondary | ICD-10-CM | POA: Diagnosis not present

## 2019-05-25 DIAGNOSIS — N184 Chronic kidney disease, stage 4 (severe): Secondary | ICD-10-CM | POA: Diagnosis not present

## 2019-05-26 ENCOUNTER — Ambulatory Visit (INDEPENDENT_AMBULATORY_CARE_PROVIDER_SITE_OTHER): Payer: Medicare HMO

## 2019-05-26 ENCOUNTER — Other Ambulatory Visit: Payer: Self-pay

## 2019-05-26 DIAGNOSIS — M810 Age-related osteoporosis without current pathological fracture: Secondary | ICD-10-CM | POA: Diagnosis not present

## 2019-05-26 MED ORDER — ROMOSOZUMAB-AQQG 105 MG/1.17ML ~~LOC~~ SOSY
210.0000 mg | PREFILLED_SYRINGE | Freq: Once | SUBCUTANEOUS | Status: AC
Start: 2019-05-26 — End: 2019-05-26
  Administered 2019-05-26: 210 mg via SUBCUTANEOUS

## 2019-05-26 NOTE — Progress Notes (Signed)
Patient presented for Evenity injection SQ to right & left arm. Patient tolerated well. 

## 2019-06-12 DIAGNOSIS — R404 Transient alteration of awareness: Secondary | ICD-10-CM | POA: Diagnosis not present

## 2019-06-12 DIAGNOSIS — E1165 Type 2 diabetes mellitus with hyperglycemia: Secondary | ICD-10-CM | POA: Diagnosis not present

## 2019-06-12 DIAGNOSIS — I499 Cardiac arrhythmia, unspecified: Secondary | ICD-10-CM | POA: Diagnosis not present

## 2019-06-12 DIAGNOSIS — R0689 Other abnormalities of breathing: Secondary | ICD-10-CM | POA: Diagnosis not present

## 2019-06-13 ENCOUNTER — Telehealth: Payer: Self-pay | Admitting: Family Medicine

## 2019-06-13 ENCOUNTER — Other Ambulatory Visit: Payer: Self-pay | Admitting: Cardiovascular Disease

## 2019-06-13 NOTE — Telephone Encounter (Signed)
Received call from [facility] that this patient passed away on April 17, 2022 evening Jun 30, 2019 sometime around 6:57 PM.  Her husband had mentioned to sheriff that "she had not been feeling well" earlier in the day but no other specific information available.  Looks like in reviewing history that she has multiple chronic medical problems.  I gave permission to release the body. Will notify primary provider of her passing.

## 2019-06-30 ENCOUNTER — Ambulatory Visit: Payer: Medicare HMO

## 2019-07-02 DIAGNOSIS — 419620001 Death: Secondary | SNOMED CT | POA: Diagnosis not present

## 2019-07-02 DEATH — deceased

## 2019-07-07 ENCOUNTER — Telehealth: Payer: Self-pay | Admitting: Internal Medicine

## 2019-07-07 NOTE — Telephone Encounter (Signed)
Josh with [employer] called to see if we have received the death certificate. He states that it was mailed to [place] on June 15, 2019. Please call him back @ [phone]

## 2019-07-07 NOTE — Telephone Encounter (Signed)
Left message with answering service and they stated that would have someone call back.

## 2019-07-08 NOTE — Telephone Encounter (Signed)
LMTCB with Josh Kobayashi.

## 2019-07-09 ENCOUNTER — Ambulatory Visit: Payer: Medicare HMO | Admitting: Podiatry

## 2019-07-09 NOTE — Telephone Encounter (Signed)
LMTCB with [facility]. Vital Records Dept.

## 2019-07-10 NOTE — Telephone Encounter (Signed)
Spoke with Josh from the funeral home to let him know that we can not go to the Health Dept to complete the death certificate because of liability reasons. I explained to him that I have tried reaching out to the health dept to see if they can mail [place] the death certificate and then we mail it back to them. Josh stated that the registrar is out on vacation this week but he gave me the name of another lady and number that he has been speaking with. Candace Monroe [phone]

## 2019-07-10 NOTE — Telephone Encounter (Signed)
LMTCB with Benjamine Mola from the [facility] Dept.

## 2019-07-14 NOTE — Telephone Encounter (Signed)
LMTCB with Benjamine Mola at [facility] Dept.

## 2019-07-17 ENCOUNTER — Ambulatory Visit: Payer: Medicare HMO

## 2019-07-17 NOTE — Telephone Encounter (Signed)
Spoke with Santiago Glad at the [facility] and and gave her permission to write in the address of the doctors office so she could expedite the death certificate. This was done on Tuesday July 13th.

## 2022-02-15 NOTE — Telephone Encounter (Signed)
error
